# Patient Record
Sex: Male | Born: 1940 | Race: White | Hispanic: No | Smoking: Former smoker
Health system: Southern US, Community
[De-identification: ages and names within clinical notes are randomized; demographics above are authoritative.]

## PROBLEM LIST (undated history)

## (undated) DIAGNOSIS — G40909 Epilepsy, unspecified, not intractable, without status epilepticus: Secondary | ICD-10-CM

## (undated) DIAGNOSIS — F32A Depression, unspecified: Secondary | ICD-10-CM

## (undated) DIAGNOSIS — Z87442 Personal history of urinary calculi: Secondary | ICD-10-CM

## (undated) DIAGNOSIS — I219 Acute myocardial infarction, unspecified: Secondary | ICD-10-CM

## (undated) DIAGNOSIS — F329 Major depressive disorder, single episode, unspecified: Secondary | ICD-10-CM

## (undated) DIAGNOSIS — H409 Unspecified glaucoma: Secondary | ICD-10-CM

## (undated) DIAGNOSIS — C189 Malignant neoplasm of colon, unspecified: Secondary | ICD-10-CM

## (undated) DIAGNOSIS — H269 Unspecified cataract: Secondary | ICD-10-CM

## (undated) DIAGNOSIS — F419 Anxiety disorder, unspecified: Secondary | ICD-10-CM

## (undated) HISTORY — DX: Epilepsy, unspecified, not intractable, without status epilepticus: G40.909

## (undated) HISTORY — DX: Major depressive disorder, single episode, unspecified: F32.9

## (undated) HISTORY — DX: Malignant neoplasm of colon, unspecified: C18.9

## (undated) HISTORY — DX: Anxiety disorder, unspecified: F41.9

## (undated) HISTORY — DX: Unspecified cataract: H26.9

## (undated) HISTORY — PX: OTHER SURGICAL HISTORY: SHX169

## (undated) HISTORY — DX: Acute myocardial infarction, unspecified: I21.9

## (undated) HISTORY — PX: CORONARY ANGIOPLASTY: SHX604

## (undated) HISTORY — DX: Depression, unspecified: F32.A

## (undated) HISTORY — PX: APPENDECTOMY: SHX54

## (undated) HISTORY — DX: Unspecified glaucoma: H40.9

## (undated) HISTORY — PX: CHOLECYSTECTOMY: SHX55

---

## 1999-06-12 DIAGNOSIS — E782 Mixed hyperlipidemia: Secondary | ICD-10-CM | POA: Insufficient documentation

## 1999-06-12 DIAGNOSIS — Z9049 Acquired absence of other specified parts of digestive tract: Secondary | ICD-10-CM | POA: Insufficient documentation

## 1999-06-12 DIAGNOSIS — L120 Bullous pemphigoid: Secondary | ICD-10-CM | POA: Insufficient documentation

## 1999-06-12 DIAGNOSIS — I5032 Chronic diastolic (congestive) heart failure: Secondary | ICD-10-CM | POA: Insufficient documentation

## 1999-06-12 DIAGNOSIS — C189 Malignant neoplasm of colon, unspecified: Secondary | ICD-10-CM | POA: Insufficient documentation

## 1999-06-12 DIAGNOSIS — I110 Hypertensive heart disease with heart failure: Secondary | ICD-10-CM | POA: Insufficient documentation

## 1999-06-12 DIAGNOSIS — J301 Allergic rhinitis due to pollen: Secondary | ICD-10-CM | POA: Insufficient documentation

## 1999-06-12 DIAGNOSIS — E559 Vitamin D deficiency, unspecified: Secondary | ICD-10-CM | POA: Insufficient documentation

## 1999-06-12 DIAGNOSIS — E669 Obesity, unspecified: Secondary | ICD-10-CM | POA: Insufficient documentation

## 2001-06-04 ENCOUNTER — Emergency Department (HOSPITAL_COMMUNITY): Admission: EM | Admit: 2001-06-04 | Discharge: 2001-06-04 | Payer: Self-pay | Admitting: Emergency Medicine

## 2009-10-24 ENCOUNTER — Ambulatory Visit (HOSPITAL_COMMUNITY): Admission: RE | Admit: 2009-10-24 | Discharge: 2009-10-24 | Payer: Self-pay | Admitting: Ophthalmology

## 2010-10-27 NOTE — Op Note (Signed)
[facility]  Patient:    Alan Melendez, Alan Melendez Visit Number: [id number] MRN: [id number]          Service Type: EMS Location: ED Attending Physician:  Doug Sou Dictated by:   Nicki Reaper, M.D. Proc. Date: 06/04/01 Admit Date:  06/04/2001                             Operative Report  PREOPERATIVE DIAGNOSIS:  Amputation tip, right index finger.  POSTOPERATIVE DIAGNOSIS:  Amputation tip, right index finger.  PROCEDURE:  Revision amputation, right index finger.  SURGEON:  Nicki Reaper, M.D.  ASSISTANT:  Joaquin Courts, R.N.  ANESTHESIA:  Metacarpal block.  HISTORY:  The patient is a 70 year old right-hand dominant male who suffered a _____ injury to his right index finger.  He was seen at the request of [facility] and transferred for repair.  DESCRIPTION OF PROCEDURE:  The patient was given a metacarpal block with 1% Xylocaine without epinephrine, prepped and draped using Betadine solution.  A Penrose drain was used for tourniquet control at the base of the finger.  The dorsal nail was debrided, a rongeur used to smooth the bone, the skin debrided.  This allowed the volar skin to be anterior and dorsally transposed and advanced for closure.  Sutures were placed of interrupted 5-0 nylon in the skin, 5-0 chromic was placed through the tip into the nail plate to secure the skin distally.  A sterile compressive dressing and splint were applied.  The patient tolerated the procedure well and is discharged home, to return to the [facility] [place] in one week, on Vicodin and Keflex. Dictated by:   Nicki Reaper, M.D. Attending Physician:  Doug Sou DD:  06/04/01 TD:  06/05/01 Job: [id number]

## 2015-06-20 DIAGNOSIS — G40909 Epilepsy, unspecified, not intractable, without status epilepticus: Secondary | ICD-10-CM | POA: Diagnosis not present

## 2015-06-20 DIAGNOSIS — Z23 Encounter for immunization: Secondary | ICD-10-CM | POA: Diagnosis not present

## 2015-06-20 DIAGNOSIS — E039 Hypothyroidism, unspecified: Secondary | ICD-10-CM | POA: Diagnosis not present

## 2015-06-20 DIAGNOSIS — F17210 Nicotine dependence, cigarettes, uncomplicated: Secondary | ICD-10-CM | POA: Diagnosis not present

## 2015-06-20 DIAGNOSIS — H8110 Benign paroxysmal vertigo, unspecified ear: Secondary | ICD-10-CM | POA: Diagnosis not present

## 2015-06-20 DIAGNOSIS — R7301 Impaired fasting glucose: Secondary | ICD-10-CM | POA: Diagnosis not present

## 2015-06-20 DIAGNOSIS — L03312 Cellulitis of back [any part except buttock]: Secondary | ICD-10-CM | POA: Diagnosis not present

## 2015-06-20 DIAGNOSIS — E7800 Pure hypercholesterolemia, unspecified: Secondary | ICD-10-CM | POA: Diagnosis not present

## 2015-06-27 DIAGNOSIS — G40909 Epilepsy, unspecified, not intractable, without status epilepticus: Secondary | ICD-10-CM | POA: Diagnosis not present

## 2015-06-27 DIAGNOSIS — M7541 Impingement syndrome of right shoulder: Secondary | ICD-10-CM | POA: Diagnosis not present

## 2015-06-27 DIAGNOSIS — R0789 Other chest pain: Secondary | ICD-10-CM | POA: Diagnosis not present

## 2015-06-27 DIAGNOSIS — E782 Mixed hyperlipidemia: Secondary | ICD-10-CM | POA: Diagnosis not present

## 2015-06-27 DIAGNOSIS — Z0001 Encounter for general adult medical examination with abnormal findings: Secondary | ICD-10-CM | POA: Diagnosis not present

## 2015-06-27 DIAGNOSIS — F17210 Nicotine dependence, cigarettes, uncomplicated: Secondary | ICD-10-CM | POA: Diagnosis not present

## 2015-06-27 DIAGNOSIS — S43431A Superior glenoid labrum lesion of right shoulder, initial encounter: Secondary | ICD-10-CM | POA: Diagnosis not present

## 2015-07-01 DIAGNOSIS — R079 Chest pain, unspecified: Secondary | ICD-10-CM | POA: Diagnosis not present

## 2015-07-04 DIAGNOSIS — R079 Chest pain, unspecified: Secondary | ICD-10-CM | POA: Diagnosis not present

## 2015-10-20 DIAGNOSIS — G40909 Epilepsy, unspecified, not intractable, without status epilepticus: Secondary | ICD-10-CM | POA: Diagnosis not present

## 2015-10-20 DIAGNOSIS — M545 Low back pain: Secondary | ICD-10-CM | POA: Diagnosis not present

## 2015-10-20 DIAGNOSIS — F17210 Nicotine dependence, cigarettes, uncomplicated: Secondary | ICD-10-CM | POA: Diagnosis not present

## 2016-01-04 DIAGNOSIS — D239 Other benign neoplasm of skin, unspecified: Secondary | ICD-10-CM | POA: Diagnosis not present

## 2016-01-04 DIAGNOSIS — L821 Other seborrheic keratosis: Secondary | ICD-10-CM | POA: Diagnosis not present

## 2016-01-04 DIAGNOSIS — L720 Epidermal cyst: Secondary | ICD-10-CM | POA: Diagnosis not present

## 2016-01-30 DIAGNOSIS — Z6830 Body mass index (BMI) 30.0-30.9, adult: Secondary | ICD-10-CM | POA: Diagnosis not present

## 2016-01-30 DIAGNOSIS — H6122 Impacted cerumen, left ear: Secondary | ICD-10-CM | POA: Diagnosis not present

## 2016-01-30 DIAGNOSIS — G40909 Epilepsy, unspecified, not intractable, without status epilepticus: Secondary | ICD-10-CM | POA: Diagnosis not present

## 2016-02-28 DIAGNOSIS — G40909 Epilepsy, unspecified, not intractable, without status epilepticus: Secondary | ICD-10-CM | POA: Diagnosis not present

## 2016-02-28 DIAGNOSIS — R7301 Impaired fasting glucose: Secondary | ICD-10-CM | POA: Diagnosis not present

## 2016-02-28 DIAGNOSIS — I10 Essential (primary) hypertension: Secondary | ICD-10-CM | POA: Diagnosis not present

## 2016-02-28 DIAGNOSIS — E782 Mixed hyperlipidemia: Secondary | ICD-10-CM | POA: Diagnosis not present

## 2016-03-01 DIAGNOSIS — G40909 Epilepsy, unspecified, not intractable, without status epilepticus: Secondary | ICD-10-CM | POA: Diagnosis not present

## 2016-03-01 DIAGNOSIS — F17210 Nicotine dependence, cigarettes, uncomplicated: Secondary | ICD-10-CM | POA: Diagnosis not present

## 2016-03-01 DIAGNOSIS — Z6830 Body mass index (BMI) 30.0-30.9, adult: Secondary | ICD-10-CM | POA: Diagnosis not present

## 2016-03-01 DIAGNOSIS — Z23 Encounter for immunization: Secondary | ICD-10-CM | POA: Diagnosis not present

## 2016-03-22 DIAGNOSIS — H401132 Primary open-angle glaucoma, bilateral, moderate stage: Secondary | ICD-10-CM | POA: Diagnosis not present

## 2016-04-05 DIAGNOSIS — H401132 Primary open-angle glaucoma, bilateral, moderate stage: Secondary | ICD-10-CM | POA: Diagnosis not present

## 2016-07-03 DIAGNOSIS — G40909 Epilepsy, unspecified, not intractable, without status epilepticus: Secondary | ICD-10-CM | POA: Diagnosis not present

## 2016-07-03 DIAGNOSIS — J301 Allergic rhinitis due to pollen: Secondary | ICD-10-CM | POA: Diagnosis not present

## 2016-07-03 DIAGNOSIS — Z6830 Body mass index (BMI) 30.0-30.9, adult: Secondary | ICD-10-CM | POA: Diagnosis not present

## 2016-07-03 DIAGNOSIS — F17210 Nicotine dependence, cigarettes, uncomplicated: Secondary | ICD-10-CM | POA: Diagnosis not present

## 2016-08-31 DIAGNOSIS — M79674 Pain in right toe(s): Secondary | ICD-10-CM | POA: Diagnosis not present

## 2016-10-26 DIAGNOSIS — R7301 Impaired fasting glucose: Secondary | ICD-10-CM | POA: Diagnosis not present

## 2016-10-26 DIAGNOSIS — G40909 Epilepsy, unspecified, not intractable, without status epilepticus: Secondary | ICD-10-CM | POA: Diagnosis not present

## 2016-10-26 DIAGNOSIS — F17210 Nicotine dependence, cigarettes, uncomplicated: Secondary | ICD-10-CM | POA: Diagnosis not present

## 2016-10-26 DIAGNOSIS — I10 Essential (primary) hypertension: Secondary | ICD-10-CM | POA: Diagnosis not present

## 2016-10-26 DIAGNOSIS — E782 Mixed hyperlipidemia: Secondary | ICD-10-CM | POA: Diagnosis not present

## 2016-10-31 DIAGNOSIS — G40909 Epilepsy, unspecified, not intractable, without status epilepticus: Secondary | ICD-10-CM | POA: Diagnosis not present

## 2016-10-31 DIAGNOSIS — J301 Allergic rhinitis due to pollen: Secondary | ICD-10-CM | POA: Diagnosis not present

## 2016-10-31 DIAGNOSIS — F17210 Nicotine dependence, cigarettes, uncomplicated: Secondary | ICD-10-CM | POA: Diagnosis not present

## 2016-10-31 DIAGNOSIS — Z6830 Body mass index (BMI) 30.0-30.9, adult: Secondary | ICD-10-CM | POA: Diagnosis not present

## 2017-04-01 DIAGNOSIS — J209 Acute bronchitis, unspecified: Secondary | ICD-10-CM | POA: Diagnosis not present

## 2017-04-01 DIAGNOSIS — Z23 Encounter for immunization: Secondary | ICD-10-CM | POA: Diagnosis not present

## 2017-04-01 DIAGNOSIS — Z6830 Body mass index (BMI) 30.0-30.9, adult: Secondary | ICD-10-CM | POA: Diagnosis not present

## 2017-07-12 DIAGNOSIS — Z6831 Body mass index (BMI) 31.0-31.9, adult: Secondary | ICD-10-CM | POA: Diagnosis not present

## 2017-07-12 DIAGNOSIS — R0789 Other chest pain: Secondary | ICD-10-CM | POA: Diagnosis not present

## 2017-07-12 DIAGNOSIS — S56912A Strain of unspecified muscles, fascia and tendons at forearm level, left arm, initial encounter: Secondary | ICD-10-CM | POA: Diagnosis not present

## 2017-07-12 DIAGNOSIS — E782 Mixed hyperlipidemia: Secondary | ICD-10-CM | POA: Diagnosis not present

## 2017-07-12 DIAGNOSIS — F17210 Nicotine dependence, cigarettes, uncomplicated: Secondary | ICD-10-CM | POA: Diagnosis not present

## 2017-07-12 DIAGNOSIS — I10 Essential (primary) hypertension: Secondary | ICD-10-CM | POA: Diagnosis not present

## 2017-09-13 DIAGNOSIS — Z0001 Encounter for general adult medical examination with abnormal findings: Secondary | ICD-10-CM | POA: Diagnosis not present

## 2017-09-13 DIAGNOSIS — Z6830 Body mass index (BMI) 30.0-30.9, adult: Secondary | ICD-10-CM | POA: Diagnosis not present

## 2017-09-13 DIAGNOSIS — G40909 Epilepsy, unspecified, not intractable, without status epilepticus: Secondary | ICD-10-CM | POA: Diagnosis not present

## 2017-09-13 DIAGNOSIS — F17210 Nicotine dependence, cigarettes, uncomplicated: Secondary | ICD-10-CM | POA: Diagnosis not present

## 2017-09-13 DIAGNOSIS — Z23 Encounter for immunization: Secondary | ICD-10-CM | POA: Diagnosis not present

## 2017-09-13 DIAGNOSIS — J301 Allergic rhinitis due to pollen: Secondary | ICD-10-CM | POA: Diagnosis not present

## 2017-09-13 DIAGNOSIS — Z1331 Encounter for screening for depression: Secondary | ICD-10-CM | POA: Diagnosis not present

## 2017-09-13 DIAGNOSIS — Z1389 Encounter for screening for other disorder: Secondary | ICD-10-CM | POA: Diagnosis not present

## 2017-09-13 DIAGNOSIS — E782 Mixed hyperlipidemia: Secondary | ICD-10-CM | POA: Diagnosis not present

## 2017-09-13 DIAGNOSIS — Z9189 Other specified personal risk factors, not elsewhere classified: Secondary | ICD-10-CM | POA: Diagnosis not present

## 2017-09-13 DIAGNOSIS — R5383 Other fatigue: Secondary | ICD-10-CM | POA: Diagnosis not present

## 2017-10-07 DIAGNOSIS — Z6830 Body mass index (BMI) 30.0-30.9, adult: Secondary | ICD-10-CM | POA: Diagnosis not present

## 2017-10-07 DIAGNOSIS — F17210 Nicotine dependence, cigarettes, uncomplicated: Secondary | ICD-10-CM | POA: Diagnosis not present

## 2017-10-07 DIAGNOSIS — J069 Acute upper respiratory infection, unspecified: Secondary | ICD-10-CM | POA: Diagnosis not present

## 2017-10-07 DIAGNOSIS — J019 Acute sinusitis, unspecified: Secondary | ICD-10-CM | POA: Diagnosis not present

## 2017-12-06 DIAGNOSIS — Z6829 Body mass index (BMI) 29.0-29.9, adult: Secondary | ICD-10-CM | POA: Diagnosis not present

## 2017-12-06 DIAGNOSIS — R202 Paresthesia of skin: Secondary | ICD-10-CM | POA: Diagnosis not present

## 2017-12-17 DIAGNOSIS — Z6830 Body mass index (BMI) 30.0-30.9, adult: Secondary | ICD-10-CM | POA: Diagnosis not present

## 2017-12-17 DIAGNOSIS — M9903 Segmental and somatic dysfunction of lumbar region: Secondary | ICD-10-CM | POA: Diagnosis not present

## 2017-12-17 DIAGNOSIS — M546 Pain in thoracic spine: Secondary | ICD-10-CM | POA: Diagnosis not present

## 2017-12-17 DIAGNOSIS — M9904 Segmental and somatic dysfunction of sacral region: Secondary | ICD-10-CM | POA: Diagnosis not present

## 2017-12-17 DIAGNOSIS — M9902 Segmental and somatic dysfunction of thoracic region: Secondary | ICD-10-CM | POA: Diagnosis not present

## 2017-12-17 DIAGNOSIS — R202 Paresthesia of skin: Secondary | ICD-10-CM | POA: Diagnosis not present

## 2017-12-18 DIAGNOSIS — M9903 Segmental and somatic dysfunction of lumbar region: Secondary | ICD-10-CM | POA: Diagnosis not present

## 2017-12-18 DIAGNOSIS — M9904 Segmental and somatic dysfunction of sacral region: Secondary | ICD-10-CM | POA: Diagnosis not present

## 2017-12-18 DIAGNOSIS — M9902 Segmental and somatic dysfunction of thoracic region: Secondary | ICD-10-CM | POA: Diagnosis not present

## 2017-12-18 DIAGNOSIS — M546 Pain in thoracic spine: Secondary | ICD-10-CM | POA: Diagnosis not present

## 2017-12-23 DIAGNOSIS — M9904 Segmental and somatic dysfunction of sacral region: Secondary | ICD-10-CM | POA: Diagnosis not present

## 2017-12-23 DIAGNOSIS — M9902 Segmental and somatic dysfunction of thoracic region: Secondary | ICD-10-CM | POA: Diagnosis not present

## 2017-12-23 DIAGNOSIS — M9903 Segmental and somatic dysfunction of lumbar region: Secondary | ICD-10-CM | POA: Diagnosis not present

## 2017-12-23 DIAGNOSIS — M546 Pain in thoracic spine: Secondary | ICD-10-CM | POA: Diagnosis not present

## 2017-12-26 DIAGNOSIS — M9902 Segmental and somatic dysfunction of thoracic region: Secondary | ICD-10-CM | POA: Diagnosis not present

## 2017-12-26 DIAGNOSIS — M9903 Segmental and somatic dysfunction of lumbar region: Secondary | ICD-10-CM | POA: Diagnosis not present

## 2017-12-26 DIAGNOSIS — M546 Pain in thoracic spine: Secondary | ICD-10-CM | POA: Diagnosis not present

## 2017-12-26 DIAGNOSIS — M9904 Segmental and somatic dysfunction of sacral region: Secondary | ICD-10-CM | POA: Diagnosis not present

## 2017-12-30 DIAGNOSIS — R202 Paresthesia of skin: Secondary | ICD-10-CM | POA: Diagnosis not present

## 2017-12-30 DIAGNOSIS — Z6830 Body mass index (BMI) 30.0-30.9, adult: Secondary | ICD-10-CM | POA: Diagnosis not present

## 2017-12-30 DIAGNOSIS — M546 Pain in thoracic spine: Secondary | ICD-10-CM | POA: Diagnosis not present

## 2018-01-01 DIAGNOSIS — M4854XA Collapsed vertebra, not elsewhere classified, thoracic region, initial encounter for fracture: Secondary | ICD-10-CM | POA: Diagnosis not present

## 2018-01-01 DIAGNOSIS — M8448XA Pathological fracture, other site, initial encounter for fracture: Secondary | ICD-10-CM | POA: Diagnosis not present

## 2018-01-01 DIAGNOSIS — M47814 Spondylosis without myelopathy or radiculopathy, thoracic region: Secondary | ICD-10-CM | POA: Diagnosis not present

## 2018-01-23 ENCOUNTER — Ambulatory Visit (INDEPENDENT_AMBULATORY_CARE_PROVIDER_SITE_OTHER): Payer: Medicare Other | Admitting: Orthopaedic Surgery

## 2018-01-23 ENCOUNTER — Encounter (INDEPENDENT_AMBULATORY_CARE_PROVIDER_SITE_OTHER): Payer: Self-pay | Admitting: Orthopaedic Surgery

## 2018-01-23 VITALS — BP 118/74 | HR 76 | Ht 69.5 in | Wt 206.0 lb

## 2018-01-23 DIAGNOSIS — M545 Low back pain, unspecified: Secondary | ICD-10-CM

## 2018-01-23 DIAGNOSIS — M546 Pain in thoracic spine: Secondary | ICD-10-CM | POA: Diagnosis not present

## 2018-01-23 NOTE — Progress Notes (Signed)
Office Visit Note   Patient: Alan Melendez           Date of Birth: December 13, 1940           MRN: [id number] Visit Date: 01/23/2018              Requested by: No referring provider defined for this encounter. PCP: Curlene Labrum, MD   Assessment & Plan: Visit Diagnoses:  1. Pain in thoracic spine   2. Low back pain without sciatica, unspecified back pain laterality, unspecified chronicity     Plan: Patient states his pain is significantly improved recently.  He is more comfortable is walking better.  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  He likely may have a bulging lumbar disc that was giving him some symptoms but this is improved.  He can return if he gets increased symptoms.  Follow-Up Instructions: Return if symptoms worsen or fail to improve.   Orders:  No orders of the defined types were placed in this encounter.  No orders of the defined types were placed in this encounter.     Procedures: No procedures performed   Clinical Data: No additional findings.   Subjective: Chief Complaint  Patient presents with  . Middle Back - Pain    HPI 77 year old male referred by Dr. Florene Route for problems with pain around T12 which he states recently is gotten better.  He said some problems with numbness in his legs.  Previous T-spine MRI demonstrated some compression at T6 not acute.  Minimal disc protrusion at T8-9 without cord compression.  He states he fell several years ago also had some increased symptoms trying to help his wife who is in poor health pulling her up in bed.  Review of Systems 14 point review of systems positive for anxiety bronchitis cataracts, depression, epilepsy on phenobarbital and Dilantin.  Previous gallbladder surgery appendectomy.  Patient smokes 2 packs/day x 50 years he is retired.  Otherwise negative as pertains HPI.  No associated bowel bladder symptoms no fever or  chills.   Objective: Vital Signs: BP 118/74   Pulse 76   Ht 5' 9.5" (1.765 m)   Wt 206 lb (93.4 kg)   BMI 29.98 kg/m   Physical Exam  Constitutional: He is oriented to person, place, and time. He appears well-developed and well-nourished.  HENT:  Head: Normocephalic and atraumatic.  Eyes: Pupils are equal, round, and reactive to light. EOM are normal.  Neck: No tracheal deviation present. No thyromegaly present.  Cardiovascular: Normal rate.  Pulmonary/Chest: Effort normal. He has no wheezes.  Abdominal: Soft. Bowel sounds are normal.  Neurological: He is alert and oriented to person, place, and time.  Skin: Skin is warm and dry. Capillary refill takes less than 2 seconds.  Psychiatric: He has a normal mood and affect. His behavior is normal. Judgment and thought content normal.    Ortho Exam patient has some tenderness thoracic paraspinals which is mild.  No rash no tenderness following the ribs.  Negative logroll to the hips.  Minimal sciatic notch tenderness.  Normal heel toe gait.  Has palpable pedal pulses.  Quadriceps hip flexors anterior tib gastrocsoleus are strong.  Specialty Comments:  No specialty comments available.  Imaging: No results found.   PMFS History: There are no active problems to display for this patient.  Past Medical History:  Diagnosis Date  . Anxiety   . Cataracts, bilateral   .  Depression   . Epilepsy ([place])   . Glaucoma     Family History  Problem Relation Age of Onset  . Diabetes Father   . Diabetes Sister     Past Surgical History:  Procedure Laterality Date  . CHOLECYSTECTOMY     Social History   Occupational History  . Not on file  Tobacco Use  . Smoking status: Current Every Day Smoker    Packs/day: 2.00    Years: 50.00    Pack years: 100.00  . Smokeless tobacco: Never Used  Substance and Sexual Activity  . Alcohol use: Not Currently  . Drug use: Not on file  . Sexual activity: Not on file

## 2018-01-26 ENCOUNTER — Encounter (INDEPENDENT_AMBULATORY_CARE_PROVIDER_SITE_OTHER): Payer: Self-pay | Admitting: Orthopaedic Surgery

## 2018-01-29 DIAGNOSIS — J0100 Acute maxillary sinusitis, unspecified: Secondary | ICD-10-CM | POA: Diagnosis not present

## 2018-01-29 DIAGNOSIS — J301 Allergic rhinitis due to pollen: Secondary | ICD-10-CM | POA: Diagnosis not present

## 2018-01-29 DIAGNOSIS — G40909 Epilepsy, unspecified, not intractable, without status epilepticus: Secondary | ICD-10-CM | POA: Diagnosis not present

## 2018-01-29 DIAGNOSIS — Z6828 Body mass index (BMI) 28.0-28.9, adult: Secondary | ICD-10-CM | POA: Diagnosis not present

## 2018-01-29 DIAGNOSIS — F17210 Nicotine dependence, cigarettes, uncomplicated: Secondary | ICD-10-CM | POA: Diagnosis not present

## 2018-01-29 DIAGNOSIS — E782 Mixed hyperlipidemia: Secondary | ICD-10-CM | POA: Diagnosis not present

## 2018-01-29 DIAGNOSIS — M545 Low back pain: Secondary | ICD-10-CM | POA: Diagnosis not present

## 2018-07-05 DIAGNOSIS — F17200 Nicotine dependence, unspecified, uncomplicated: Secondary | ICD-10-CM | POA: Diagnosis not present

## 2018-07-05 DIAGNOSIS — M545 Low back pain: Secondary | ICD-10-CM | POA: Diagnosis not present

## 2018-07-05 DIAGNOSIS — X500XXA Overexertion from strenuous movement or load, initial encounter: Secondary | ICD-10-CM | POA: Diagnosis not present

## 2018-07-05 DIAGNOSIS — S39012A Strain of muscle, fascia and tendon of lower back, initial encounter: Secondary | ICD-10-CM | POA: Diagnosis not present

## 2018-07-05 DIAGNOSIS — Z79899 Other long term (current) drug therapy: Secondary | ICD-10-CM | POA: Diagnosis not present

## 2018-07-08 DIAGNOSIS — Z6828 Body mass index (BMI) 28.0-28.9, adult: Secondary | ICD-10-CM | POA: Diagnosis not present

## 2018-07-08 DIAGNOSIS — M5430 Sciatica, unspecified side: Secondary | ICD-10-CM | POA: Diagnosis not present

## 2018-07-16 DIAGNOSIS — J440 Chronic obstructive pulmonary disease with acute lower respiratory infection: Secondary | ICD-10-CM | POA: Diagnosis not present

## 2018-07-16 DIAGNOSIS — M5416 Radiculopathy, lumbar region: Secondary | ICD-10-CM | POA: Diagnosis not present

## 2018-07-16 DIAGNOSIS — M545 Low back pain: Secondary | ICD-10-CM | POA: Diagnosis not present

## 2018-07-16 DIAGNOSIS — R509 Fever, unspecified: Secondary | ICD-10-CM | POA: Diagnosis not present

## 2018-07-16 DIAGNOSIS — Z6828 Body mass index (BMI) 28.0-28.9, adult: Secondary | ICD-10-CM | POA: Diagnosis not present

## 2018-07-18 DIAGNOSIS — M5416 Radiculopathy, lumbar region: Secondary | ICD-10-CM | POA: Diagnosis not present

## 2018-07-18 DIAGNOSIS — M4856XA Collapsed vertebra, not elsewhere classified, lumbar region, initial encounter for fracture: Secondary | ICD-10-CM | POA: Diagnosis not present

## 2018-07-18 DIAGNOSIS — R29890 Loss of height: Secondary | ICD-10-CM | POA: Diagnosis not present

## 2018-07-18 DIAGNOSIS — M5126 Other intervertebral disc displacement, lumbar region: Secondary | ICD-10-CM | POA: Diagnosis not present

## 2018-07-18 DIAGNOSIS — M48061 Spinal stenosis, lumbar region without neurogenic claudication: Secondary | ICD-10-CM | POA: Diagnosis not present

## 2018-07-18 DIAGNOSIS — M5136 Other intervertebral disc degeneration, lumbar region: Secondary | ICD-10-CM | POA: Diagnosis not present

## 2018-07-18 DIAGNOSIS — M545 Low back pain: Secondary | ICD-10-CM | POA: Diagnosis not present

## 2018-07-23 DIAGNOSIS — S32019D Unspecified fracture of first lumbar vertebra, subsequent encounter for fracture with routine healing: Secondary | ICD-10-CM | POA: Diagnosis not present

## 2018-07-23 DIAGNOSIS — M549 Dorsalgia, unspecified: Secondary | ICD-10-CM | POA: Diagnosis not present

## 2018-08-13 DIAGNOSIS — S32019D Unspecified fracture of first lumbar vertebra, subsequent encounter for fracture with routine healing: Secondary | ICD-10-CM | POA: Diagnosis not present

## 2018-09-15 DIAGNOSIS — E782 Mixed hyperlipidemia: Secondary | ICD-10-CM | POA: Diagnosis not present

## 2018-09-15 DIAGNOSIS — R7301 Impaired fasting glucose: Secondary | ICD-10-CM | POA: Diagnosis not present

## 2018-09-15 DIAGNOSIS — J440 Chronic obstructive pulmonary disease with acute lower respiratory infection: Secondary | ICD-10-CM | POA: Diagnosis not present

## 2018-09-15 DIAGNOSIS — I10 Essential (primary) hypertension: Secondary | ICD-10-CM | POA: Diagnosis not present

## 2018-09-15 DIAGNOSIS — F17210 Nicotine dependence, cigarettes, uncomplicated: Secondary | ICD-10-CM | POA: Diagnosis not present

## 2018-09-18 DIAGNOSIS — G40909 Epilepsy, unspecified, not intractable, without status epilepticus: Secondary | ICD-10-CM | POA: Diagnosis not present

## 2018-09-18 DIAGNOSIS — M810 Age-related osteoporosis without current pathological fracture: Secondary | ICD-10-CM | POA: Diagnosis not present

## 2018-09-18 DIAGNOSIS — J301 Allergic rhinitis due to pollen: Secondary | ICD-10-CM | POA: Diagnosis not present

## 2018-09-18 DIAGNOSIS — J0100 Acute maxillary sinusitis, unspecified: Secondary | ICD-10-CM | POA: Diagnosis not present

## 2018-09-18 DIAGNOSIS — F17210 Nicotine dependence, cigarettes, uncomplicated: Secondary | ICD-10-CM | POA: Diagnosis not present

## 2018-09-18 DIAGNOSIS — Z6828 Body mass index (BMI) 28.0-28.9, adult: Secondary | ICD-10-CM | POA: Diagnosis not present

## 2018-09-18 DIAGNOSIS — E782 Mixed hyperlipidemia: Secondary | ICD-10-CM | POA: Diagnosis not present

## 2018-09-18 DIAGNOSIS — M545 Low back pain: Secondary | ICD-10-CM | POA: Diagnosis not present

## 2018-09-23 DIAGNOSIS — M5136 Other intervertebral disc degeneration, lumbar region: Secondary | ICD-10-CM | POA: Diagnosis not present

## 2018-09-23 DIAGNOSIS — M9904 Segmental and somatic dysfunction of sacral region: Secondary | ICD-10-CM | POA: Diagnosis not present

## 2018-09-23 DIAGNOSIS — M9905 Segmental and somatic dysfunction of pelvic region: Secondary | ICD-10-CM | POA: Diagnosis not present

## 2018-09-23 DIAGNOSIS — M9903 Segmental and somatic dysfunction of lumbar region: Secondary | ICD-10-CM | POA: Diagnosis not present

## 2018-09-24 DIAGNOSIS — M9903 Segmental and somatic dysfunction of lumbar region: Secondary | ICD-10-CM | POA: Diagnosis not present

## 2018-09-24 DIAGNOSIS — M9905 Segmental and somatic dysfunction of pelvic region: Secondary | ICD-10-CM | POA: Diagnosis not present

## 2018-09-24 DIAGNOSIS — M5136 Other intervertebral disc degeneration, lumbar region: Secondary | ICD-10-CM | POA: Diagnosis not present

## 2018-09-24 DIAGNOSIS — M9904 Segmental and somatic dysfunction of sacral region: Secondary | ICD-10-CM | POA: Diagnosis not present

## 2018-09-25 DIAGNOSIS — M9904 Segmental and somatic dysfunction of sacral region: Secondary | ICD-10-CM | POA: Diagnosis not present

## 2018-09-25 DIAGNOSIS — M9905 Segmental and somatic dysfunction of pelvic region: Secondary | ICD-10-CM | POA: Diagnosis not present

## 2018-09-25 DIAGNOSIS — M9903 Segmental and somatic dysfunction of lumbar region: Secondary | ICD-10-CM | POA: Diagnosis not present

## 2018-09-25 DIAGNOSIS — M5136 Other intervertebral disc degeneration, lumbar region: Secondary | ICD-10-CM | POA: Diagnosis not present

## 2018-09-29 DIAGNOSIS — M9904 Segmental and somatic dysfunction of sacral region: Secondary | ICD-10-CM | POA: Diagnosis not present

## 2018-09-29 DIAGNOSIS — M9903 Segmental and somatic dysfunction of lumbar region: Secondary | ICD-10-CM | POA: Diagnosis not present

## 2018-09-29 DIAGNOSIS — M9905 Segmental and somatic dysfunction of pelvic region: Secondary | ICD-10-CM | POA: Diagnosis not present

## 2018-09-29 DIAGNOSIS — M5136 Other intervertebral disc degeneration, lumbar region: Secondary | ICD-10-CM | POA: Diagnosis not present

## 2018-09-30 DIAGNOSIS — M9904 Segmental and somatic dysfunction of sacral region: Secondary | ICD-10-CM | POA: Diagnosis not present

## 2018-09-30 DIAGNOSIS — M9903 Segmental and somatic dysfunction of lumbar region: Secondary | ICD-10-CM | POA: Diagnosis not present

## 2018-09-30 DIAGNOSIS — M5136 Other intervertebral disc degeneration, lumbar region: Secondary | ICD-10-CM | POA: Diagnosis not present

## 2018-09-30 DIAGNOSIS — M9905 Segmental and somatic dysfunction of pelvic region: Secondary | ICD-10-CM | POA: Diagnosis not present

## 2018-10-01 DIAGNOSIS — M9905 Segmental and somatic dysfunction of pelvic region: Secondary | ICD-10-CM | POA: Diagnosis not present

## 2018-10-01 DIAGNOSIS — M9903 Segmental and somatic dysfunction of lumbar region: Secondary | ICD-10-CM | POA: Diagnosis not present

## 2018-10-01 DIAGNOSIS — M9904 Segmental and somatic dysfunction of sacral region: Secondary | ICD-10-CM | POA: Diagnosis not present

## 2018-10-01 DIAGNOSIS — M5136 Other intervertebral disc degeneration, lumbar region: Secondary | ICD-10-CM | POA: Diagnosis not present

## 2018-10-07 DIAGNOSIS — M9904 Segmental and somatic dysfunction of sacral region: Secondary | ICD-10-CM | POA: Diagnosis not present

## 2018-10-07 DIAGNOSIS — M9905 Segmental and somatic dysfunction of pelvic region: Secondary | ICD-10-CM | POA: Diagnosis not present

## 2018-10-07 DIAGNOSIS — M9903 Segmental and somatic dysfunction of lumbar region: Secondary | ICD-10-CM | POA: Diagnosis not present

## 2018-10-07 DIAGNOSIS — M5136 Other intervertebral disc degeneration, lumbar region: Secondary | ICD-10-CM | POA: Diagnosis not present

## 2018-10-09 DIAGNOSIS — M9904 Segmental and somatic dysfunction of sacral region: Secondary | ICD-10-CM | POA: Diagnosis not present

## 2018-10-09 DIAGNOSIS — M9905 Segmental and somatic dysfunction of pelvic region: Secondary | ICD-10-CM | POA: Diagnosis not present

## 2018-10-09 DIAGNOSIS — M5136 Other intervertebral disc degeneration, lumbar region: Secondary | ICD-10-CM | POA: Diagnosis not present

## 2018-10-09 DIAGNOSIS — M9903 Segmental and somatic dysfunction of lumbar region: Secondary | ICD-10-CM | POA: Diagnosis not present

## 2018-10-14 DIAGNOSIS — M9904 Segmental and somatic dysfunction of sacral region: Secondary | ICD-10-CM | POA: Diagnosis not present

## 2018-10-14 DIAGNOSIS — M9903 Segmental and somatic dysfunction of lumbar region: Secondary | ICD-10-CM | POA: Diagnosis not present

## 2018-10-14 DIAGNOSIS — M5136 Other intervertebral disc degeneration, lumbar region: Secondary | ICD-10-CM | POA: Diagnosis not present

## 2018-10-14 DIAGNOSIS — M9905 Segmental and somatic dysfunction of pelvic region: Secondary | ICD-10-CM | POA: Diagnosis not present

## 2018-10-16 DIAGNOSIS — M5136 Other intervertebral disc degeneration, lumbar region: Secondary | ICD-10-CM | POA: Diagnosis not present

## 2018-10-16 DIAGNOSIS — M9905 Segmental and somatic dysfunction of pelvic region: Secondary | ICD-10-CM | POA: Diagnosis not present

## 2018-10-16 DIAGNOSIS — M9903 Segmental and somatic dysfunction of lumbar region: Secondary | ICD-10-CM | POA: Diagnosis not present

## 2018-10-16 DIAGNOSIS — M9904 Segmental and somatic dysfunction of sacral region: Secondary | ICD-10-CM | POA: Diagnosis not present

## 2019-01-08 DIAGNOSIS — H401110 Primary open-angle glaucoma, right eye, stage unspecified: Secondary | ICD-10-CM | POA: Diagnosis not present

## 2019-01-28 DIAGNOSIS — K429 Umbilical hernia without obstruction or gangrene: Secondary | ICD-10-CM | POA: Diagnosis not present

## 2019-01-28 DIAGNOSIS — K5900 Constipation, unspecified: Secondary | ICD-10-CM | POA: Diagnosis not present

## 2019-01-28 DIAGNOSIS — Z6827 Body mass index (BMI) 27.0-27.9, adult: Secondary | ICD-10-CM | POA: Diagnosis not present

## 2019-02-20 DIAGNOSIS — M25531 Pain in right wrist: Secondary | ICD-10-CM | POA: Diagnosis not present

## 2019-02-20 DIAGNOSIS — Z6827 Body mass index (BMI) 27.0-27.9, adult: Secondary | ICD-10-CM | POA: Diagnosis not present

## 2019-03-17 DIAGNOSIS — Z6825 Body mass index (BMI) 25.0-25.9, adult: Secondary | ICD-10-CM | POA: Diagnosis not present

## 2019-03-17 DIAGNOSIS — Z23 Encounter for immunization: Secondary | ICD-10-CM | POA: Diagnosis not present

## 2019-03-17 DIAGNOSIS — F17210 Nicotine dependence, cigarettes, uncomplicated: Secondary | ICD-10-CM | POA: Diagnosis not present

## 2019-03-17 DIAGNOSIS — J301 Allergic rhinitis due to pollen: Secondary | ICD-10-CM | POA: Diagnosis not present

## 2019-03-17 DIAGNOSIS — Z6828 Body mass index (BMI) 28.0-28.9, adult: Secondary | ICD-10-CM | POA: Diagnosis not present

## 2019-03-17 DIAGNOSIS — Z9189 Other specified personal risk factors, not elsewhere classified: Secondary | ICD-10-CM | POA: Diagnosis not present

## 2019-03-17 DIAGNOSIS — G40909 Epilepsy, unspecified, not intractable, without status epilepticus: Secondary | ICD-10-CM | POA: Diagnosis not present

## 2019-03-17 DIAGNOSIS — M545 Low back pain: Secondary | ICD-10-CM | POA: Diagnosis not present

## 2019-03-17 DIAGNOSIS — M810 Age-related osteoporosis without current pathological fracture: Secondary | ICD-10-CM | POA: Diagnosis not present

## 2019-03-17 DIAGNOSIS — E782 Mixed hyperlipidemia: Secondary | ICD-10-CM | POA: Diagnosis not present

## 2019-03-17 DIAGNOSIS — E559 Vitamin D deficiency, unspecified: Secondary | ICD-10-CM | POA: Diagnosis not present

## 2019-05-03 DIAGNOSIS — Z720 Tobacco use: Secondary | ICD-10-CM | POA: Diagnosis not present

## 2019-05-03 DIAGNOSIS — M810 Age-related osteoporosis without current pathological fracture: Secondary | ICD-10-CM | POA: Diagnosis present

## 2019-05-03 DIAGNOSIS — F17210 Nicotine dependence, cigarettes, uncomplicated: Secondary | ICD-10-CM | POA: Diagnosis present

## 2019-05-03 DIAGNOSIS — Z20828 Contact with and (suspected) exposure to other viral communicable diseases: Secondary | ICD-10-CM | POA: Diagnosis present

## 2019-05-03 DIAGNOSIS — G40409 Other generalized epilepsy and epileptic syndromes, not intractable, without status epilepticus: Secondary | ICD-10-CM | POA: Diagnosis present

## 2019-05-03 DIAGNOSIS — I25119 Atherosclerotic heart disease of native coronary artery with unspecified angina pectoris: Secondary | ICD-10-CM | POA: Diagnosis not present

## 2019-05-03 DIAGNOSIS — R079 Chest pain, unspecified: Secondary | ICD-10-CM | POA: Diagnosis not present

## 2019-05-03 DIAGNOSIS — I249 Acute ischemic heart disease, unspecified: Secondary | ICD-10-CM | POA: Diagnosis not present

## 2019-05-03 DIAGNOSIS — I214 Non-ST elevation (NSTEMI) myocardial infarction: Secondary | ICD-10-CM | POA: Diagnosis not present

## 2019-05-04 DIAGNOSIS — R079 Chest pain, unspecified: Secondary | ICD-10-CM | POA: Diagnosis not present

## 2019-05-04 DIAGNOSIS — I25119 Atherosclerotic heart disease of native coronary artery with unspecified angina pectoris: Secondary | ICD-10-CM | POA: Diagnosis not present

## 2019-05-05 DIAGNOSIS — Z7983 Long term (current) use of bisphosphonates: Secondary | ICD-10-CM | POA: Diagnosis not present

## 2019-05-05 DIAGNOSIS — I509 Heart failure, unspecified: Secondary | ICD-10-CM | POA: Diagnosis not present

## 2019-05-05 DIAGNOSIS — R531 Weakness: Secondary | ICD-10-CM | POA: Diagnosis not present

## 2019-05-05 DIAGNOSIS — R569 Unspecified convulsions: Secondary | ICD-10-CM | POA: Diagnosis not present

## 2019-05-05 DIAGNOSIS — I252 Old myocardial infarction: Secondary | ICD-10-CM | POA: Diagnosis not present

## 2019-05-05 DIAGNOSIS — G40909 Epilepsy, unspecified, not intractable, without status epilepticus: Secondary | ICD-10-CM | POA: Diagnosis not present

## 2019-05-05 DIAGNOSIS — H409 Unspecified glaucoma: Secondary | ICD-10-CM | POA: Diagnosis present

## 2019-05-05 DIAGNOSIS — I214 Non-ST elevation (NSTEMI) myocardial infarction: Secondary | ICD-10-CM | POA: Diagnosis not present

## 2019-05-05 DIAGNOSIS — I2510 Atherosclerotic heart disease of native coronary artery without angina pectoris: Secondary | ICD-10-CM | POA: Diagnosis not present

## 2019-05-05 DIAGNOSIS — Z451 Encounter for adjustment and management of infusion pump: Secondary | ICD-10-CM | POA: Diagnosis not present

## 2019-05-05 DIAGNOSIS — Z66 Do not resuscitate: Secondary | ICD-10-CM | POA: Diagnosis present

## 2019-05-05 DIAGNOSIS — F17210 Nicotine dependence, cigarettes, uncomplicated: Secondary | ICD-10-CM | POA: Diagnosis present

## 2019-06-16 DIAGNOSIS — G40909 Epilepsy, unspecified, not intractable, without status epilepticus: Secondary | ICD-10-CM | POA: Diagnosis not present

## 2019-06-16 DIAGNOSIS — I209 Angina pectoris, unspecified: Secondary | ICD-10-CM | POA: Diagnosis not present

## 2019-06-16 DIAGNOSIS — I214 Non-ST elevation (NSTEMI) myocardial infarction: Secondary | ICD-10-CM | POA: Diagnosis not present

## 2019-06-16 DIAGNOSIS — Z720 Tobacco use: Secondary | ICD-10-CM | POA: Diagnosis not present

## 2019-06-18 DIAGNOSIS — K625 Hemorrhage of anus and rectum: Secondary | ICD-10-CM | POA: Diagnosis not present

## 2019-06-18 DIAGNOSIS — Z6828 Body mass index (BMI) 28.0-28.9, adult: Secondary | ICD-10-CM | POA: Diagnosis not present

## 2019-06-30 DIAGNOSIS — K625 Hemorrhage of anus and rectum: Secondary | ICD-10-CM | POA: Diagnosis not present

## 2019-07-10 DIAGNOSIS — I214 Non-ST elevation (NSTEMI) myocardial infarction: Secondary | ICD-10-CM | POA: Diagnosis not present

## 2019-07-10 DIAGNOSIS — J440 Chronic obstructive pulmonary disease with acute lower respiratory infection: Secondary | ICD-10-CM | POA: Diagnosis not present

## 2019-07-24 DIAGNOSIS — Z01818 Encounter for other preprocedural examination: Secondary | ICD-10-CM | POA: Diagnosis not present

## 2019-07-27 DIAGNOSIS — K6389 Other specified diseases of intestine: Secondary | ICD-10-CM | POA: Diagnosis not present

## 2019-07-27 DIAGNOSIS — D128 Benign neoplasm of rectum: Secondary | ICD-10-CM | POA: Diagnosis not present

## 2019-07-27 DIAGNOSIS — E7800 Pure hypercholesterolemia, unspecified: Secondary | ICD-10-CM | POA: Diagnosis not present

## 2019-07-27 DIAGNOSIS — K625 Hemorrhage of anus and rectum: Secondary | ICD-10-CM | POA: Diagnosis not present

## 2019-07-27 DIAGNOSIS — D126 Benign neoplasm of colon, unspecified: Secondary | ICD-10-CM | POA: Diagnosis not present

## 2019-07-27 DIAGNOSIS — N281 Cyst of kidney, acquired: Secondary | ICD-10-CM | POA: Diagnosis not present

## 2019-07-27 DIAGNOSIS — E278 Other specified disorders of adrenal gland: Secondary | ICD-10-CM | POA: Diagnosis not present

## 2019-07-27 DIAGNOSIS — J9811 Atelectasis: Secondary | ICD-10-CM | POA: Diagnosis not present

## 2019-07-27 DIAGNOSIS — I252 Old myocardial infarction: Secondary | ICD-10-CM | POA: Diagnosis not present

## 2019-07-27 DIAGNOSIS — R935 Abnormal findings on diagnostic imaging of other abdominal regions, including retroperitoneum: Secondary | ICD-10-CM | POA: Diagnosis not present

## 2019-07-27 DIAGNOSIS — K621 Rectal polyp: Secondary | ICD-10-CM | POA: Diagnosis not present

## 2019-07-27 DIAGNOSIS — Z79899 Other long term (current) drug therapy: Secondary | ICD-10-CM | POA: Diagnosis not present

## 2019-07-27 DIAGNOSIS — Z955 Presence of coronary angioplasty implant and graft: Secondary | ICD-10-CM | POA: Diagnosis not present

## 2019-07-27 DIAGNOSIS — Z7982 Long term (current) use of aspirin: Secondary | ICD-10-CM | POA: Diagnosis not present

## 2019-08-03 DIAGNOSIS — E782 Mixed hyperlipidemia: Secondary | ICD-10-CM | POA: Diagnosis not present

## 2019-08-03 DIAGNOSIS — C189 Malignant neoplasm of colon, unspecified: Secondary | ICD-10-CM | POA: Diagnosis not present

## 2019-08-03 DIAGNOSIS — Z6829 Body mass index (BMI) 29.0-29.9, adult: Secondary | ICD-10-CM | POA: Diagnosis not present

## 2019-08-03 DIAGNOSIS — G40909 Epilepsy, unspecified, not intractable, without status epilepticus: Secondary | ICD-10-CM | POA: Diagnosis not present

## 2019-08-03 DIAGNOSIS — E559 Vitamin D deficiency, unspecified: Secondary | ICD-10-CM | POA: Diagnosis not present

## 2019-08-03 DIAGNOSIS — M545 Low back pain: Secondary | ICD-10-CM | POA: Diagnosis not present

## 2019-08-03 DIAGNOSIS — F17210 Nicotine dependence, cigarettes, uncomplicated: Secondary | ICD-10-CM | POA: Diagnosis not present

## 2019-08-03 DIAGNOSIS — Z9189 Other specified personal risk factors, not elsewhere classified: Secondary | ICD-10-CM | POA: Diagnosis not present

## 2019-08-03 DIAGNOSIS — M810 Age-related osteoporosis without current pathological fracture: Secondary | ICD-10-CM | POA: Diagnosis not present

## 2019-08-03 DIAGNOSIS — J301 Allergic rhinitis due to pollen: Secondary | ICD-10-CM | POA: Diagnosis not present

## 2019-08-03 DIAGNOSIS — Z23 Encounter for immunization: Secondary | ICD-10-CM | POA: Diagnosis not present

## 2019-08-03 DIAGNOSIS — I2510 Atherosclerotic heart disease of native coronary artery without angina pectoris: Secondary | ICD-10-CM | POA: Diagnosis not present

## 2019-08-07 DIAGNOSIS — I10 Essential (primary) hypertension: Secondary | ICD-10-CM | POA: Diagnosis not present

## 2019-08-07 DIAGNOSIS — E7849 Other hyperlipidemia: Secondary | ICD-10-CM | POA: Diagnosis not present

## 2019-08-20 DIAGNOSIS — L298 Other pruritus: Secondary | ICD-10-CM | POA: Diagnosis not present

## 2019-08-25 DIAGNOSIS — K6389 Other specified diseases of intestine: Secondary | ICD-10-CM | POA: Diagnosis not present

## 2019-08-28 DIAGNOSIS — K6389 Other specified diseases of intestine: Secondary | ICD-10-CM | POA: Diagnosis not present

## 2019-09-03 DIAGNOSIS — K55059 Acute (reversible) ischemia of intestine, part and extent unspecified: Secondary | ICD-10-CM | POA: Diagnosis not present

## 2019-09-03 DIAGNOSIS — K6389 Other specified diseases of intestine: Secondary | ICD-10-CM | POA: Diagnosis not present

## 2019-09-03 DIAGNOSIS — I219 Acute myocardial infarction, unspecified: Secondary | ICD-10-CM | POA: Diagnosis not present

## 2019-09-15 DIAGNOSIS — K6389 Other specified diseases of intestine: Secondary | ICD-10-CM | POA: Diagnosis not present

## 2019-09-15 DIAGNOSIS — Z66 Do not resuscitate: Secondary | ICD-10-CM | POA: Diagnosis not present

## 2019-09-15 DIAGNOSIS — K55059 Acute (reversible) ischemia of intestine, part and extent unspecified: Secondary | ICD-10-CM | POA: Diagnosis not present

## 2019-09-15 DIAGNOSIS — I219 Acute myocardial infarction, unspecified: Secondary | ICD-10-CM | POA: Diagnosis not present

## 2019-09-15 DIAGNOSIS — Z01810 Encounter for preprocedural cardiovascular examination: Secondary | ICD-10-CM | POA: Diagnosis not present

## 2019-09-15 DIAGNOSIS — I214 Non-ST elevation (NSTEMI) myocardial infarction: Secondary | ICD-10-CM | POA: Diagnosis not present

## 2019-09-23 DIAGNOSIS — Z955 Presence of coronary angioplasty implant and graft: Secondary | ICD-10-CM | POA: Diagnosis not present

## 2019-09-23 DIAGNOSIS — Z01810 Encounter for preprocedural cardiovascular examination: Secondary | ICD-10-CM | POA: Diagnosis not present

## 2019-09-23 DIAGNOSIS — I252 Old myocardial infarction: Secondary | ICD-10-CM | POA: Diagnosis not present

## 2019-09-23 DIAGNOSIS — I2510 Atherosclerotic heart disease of native coronary artery without angina pectoris: Secondary | ICD-10-CM | POA: Diagnosis not present

## 2019-09-23 DIAGNOSIS — I952 Hypotension due to drugs: Secondary | ICD-10-CM | POA: Diagnosis not present

## 2019-09-23 DIAGNOSIS — Z7983 Long term (current) use of bisphosphonates: Secondary | ICD-10-CM | POA: Diagnosis not present

## 2019-09-23 DIAGNOSIS — Z7982 Long term (current) use of aspirin: Secondary | ICD-10-CM | POA: Diagnosis not present

## 2019-09-23 DIAGNOSIS — G40909 Epilepsy, unspecified, not intractable, without status epilepticus: Secondary | ICD-10-CM | POA: Diagnosis present

## 2019-09-23 DIAGNOSIS — Z20822 Contact with and (suspected) exposure to covid-19: Secondary | ICD-10-CM | POA: Diagnosis present

## 2019-09-23 DIAGNOSIS — R0902 Hypoxemia: Secondary | ICD-10-CM | POA: Diagnosis not present

## 2019-09-23 DIAGNOSIS — E785 Hyperlipidemia, unspecified: Secondary | ICD-10-CM | POA: Diagnosis present

## 2019-09-23 DIAGNOSIS — E663 Overweight: Secondary | ICD-10-CM | POA: Diagnosis present

## 2019-09-23 DIAGNOSIS — C19 Malignant neoplasm of rectosigmoid junction: Secondary | ICD-10-CM | POA: Diagnosis present

## 2019-09-23 DIAGNOSIS — I248 Other forms of acute ischemic heart disease: Secondary | ICD-10-CM | POA: Diagnosis not present

## 2019-09-23 DIAGNOSIS — G92 Toxic encephalopathy: Secondary | ICD-10-CM | POA: Diagnosis not present

## 2019-09-23 DIAGNOSIS — E7800 Pure hypercholesterolemia, unspecified: Secondary | ICD-10-CM | POA: Diagnosis present

## 2019-09-23 DIAGNOSIS — Z6830 Body mass index (BMI) 30.0-30.9, adult: Secondary | ICD-10-CM | POA: Diagnosis not present

## 2019-09-23 DIAGNOSIS — Z01818 Encounter for other preprocedural examination: Secondary | ICD-10-CM | POA: Diagnosis not present

## 2019-09-23 DIAGNOSIS — C189 Malignant neoplasm of colon, unspecified: Secondary | ICD-10-CM | POA: Diagnosis not present

## 2019-09-23 DIAGNOSIS — T463X5A Adverse effect of coronary vasodilators, initial encounter: Secondary | ICD-10-CM | POA: Diagnosis not present

## 2019-09-23 DIAGNOSIS — I249 Acute ischemic heart disease, unspecified: Secondary | ICD-10-CM | POA: Diagnosis not present

## 2019-10-05 DIAGNOSIS — C187 Malignant neoplasm of sigmoid colon: Secondary | ICD-10-CM | POA: Diagnosis not present

## 2019-10-05 DIAGNOSIS — N342 Other urethritis: Secondary | ICD-10-CM | POA: Diagnosis not present

## 2019-10-05 DIAGNOSIS — R110 Nausea: Secondary | ICD-10-CM | POA: Diagnosis not present

## 2019-10-05 DIAGNOSIS — K5900 Constipation, unspecified: Secondary | ICD-10-CM | POA: Diagnosis not present

## 2019-10-09 DIAGNOSIS — I214 Non-ST elevation (NSTEMI) myocardial infarction: Secondary | ICD-10-CM | POA: Diagnosis not present

## 2019-10-09 DIAGNOSIS — I10 Essential (primary) hypertension: Secondary | ICD-10-CM | POA: Diagnosis not present

## 2019-10-26 DIAGNOSIS — N400 Enlarged prostate without lower urinary tract symptoms: Secondary | ICD-10-CM | POA: Diagnosis not present

## 2019-10-26 DIAGNOSIS — C189 Malignant neoplasm of colon, unspecified: Secondary | ICD-10-CM | POA: Diagnosis not present

## 2019-10-27 DIAGNOSIS — I209 Angina pectoris, unspecified: Secondary | ICD-10-CM | POA: Diagnosis not present

## 2019-10-27 DIAGNOSIS — I214 Non-ST elevation (NSTEMI) myocardial infarction: Secondary | ICD-10-CM | POA: Diagnosis not present

## 2019-11-09 DIAGNOSIS — I2510 Atherosclerotic heart disease of native coronary artery without angina pectoris: Secondary | ICD-10-CM | POA: Diagnosis not present

## 2019-11-09 DIAGNOSIS — I214 Non-ST elevation (NSTEMI) myocardial infarction: Secondary | ICD-10-CM | POA: Diagnosis not present

## 2019-11-09 DIAGNOSIS — I10 Essential (primary) hypertension: Secondary | ICD-10-CM | POA: Diagnosis not present

## 2019-11-09 DIAGNOSIS — Z87891 Personal history of nicotine dependence: Secondary | ICD-10-CM | POA: Diagnosis not present

## 2019-11-13 ENCOUNTER — Ambulatory Visit: Payer: Self-pay | Admitting: Urology

## 2019-12-15 ENCOUNTER — Encounter: Payer: Self-pay | Admitting: Urology

## 2019-12-15 ENCOUNTER — Other Ambulatory Visit: Payer: Self-pay

## 2019-12-15 ENCOUNTER — Ambulatory Visit (INDEPENDENT_AMBULATORY_CARE_PROVIDER_SITE_OTHER): Payer: Medicare Other | Admitting: Urology

## 2019-12-15 VITALS — BP 121/66 | HR 64 | Temp 98.2°F | Ht 72.0 in | Wt 220.0 lb

## 2019-12-15 DIAGNOSIS — N402 Nodular prostate without lower urinary tract symptoms: Secondary | ICD-10-CM | POA: Diagnosis not present

## 2019-12-15 DIAGNOSIS — N401 Enlarged prostate with lower urinary tract symptoms: Secondary | ICD-10-CM | POA: Diagnosis not present

## 2019-12-15 DIAGNOSIS — R350 Frequency of micturition: Secondary | ICD-10-CM | POA: Diagnosis not present

## 2019-12-15 LAB — POCT URINALYSIS DIPSTICK
Bilirubin, UA: NEGATIVE
Blood, UA: NEGATIVE
Glucose, UA: NEGATIVE
Ketones, UA: NEGATIVE
Leukocytes, UA: NEGATIVE
Nitrite, UA: NEGATIVE
Protein, UA: NEGATIVE
Spec Grav, UA: 1.015 (ref 1.010–1.025)
Urobilinogen, UA: 0.2 E.U./dL
pH, UA: 6.5 (ref 5.0–8.0)

## 2019-12-15 MED ORDER — ALFUZOSIN HCL ER 10 MG PO TB24: 10.0000 mg | ORAL_TABLET | Freq: Every day | ORAL | 11 refills | Status: DC

## 2019-12-15 NOTE — Progress Notes (Signed)
See progress note.

## 2019-12-15 NOTE — Progress Notes (Signed)
H&P  Chief Complaint: Urinary issues  History of Present Illness: 79 year old male sent by Dr. Kern Alberta for urinary issues.  The patient states that he has urinary frequency during the day especially, intermittent, slow stream, feeling of incomplete emptying.  Apparently, he was started on Flomax a while back.  He states that this did not help significantly.  He denies seeing a urologist in the past.  He denies urinary tract infections.  He denies hematuria.  Past Medical History:  Diagnosis Date  . Anxiety   . Cataracts, bilateral   . Colon cancer ([place])   . Depression   . Epilepsy ([place])   . Glaucoma   . Heart attack [facility])     Past Surgical History:  Procedure Laterality Date  . CHOLECYSTECTOMY      Home Medications:  Allergies as of 12/15/2019   No Known Allergies     Medication List       Accurate as of December 15, 2019 12:16 PM. If you have any questions, ask your nurse or doctor.        acetaminophen 500 MG tablet Commonly known as: TYLENOL Take by mouth.   alfuzosin 10 MG 24 hr tablet Commonly known as: UROXATRAL Take 1 tablet (10 mg total) by mouth daily with breakfast. Started by: Jorja Loa, MD   aspirin 81 MG EC tablet Take by mouth.   dorzolamide-timolol 22.3-6.8 MG/ML ophthalmic solution Commonly known as: COSOPT 1 drop 3 (three) times daily.   Gentle Laxative 5 MG EC tablet Generic drug: bisacodyl Take by mouth.   hydrOXYzine 25 MG tablet Commonly known as: ATARAX/VISTARIL TAKE 1 TABLET BY MOUTH FOUR TIMES DAILY   latanoprost 0.005 % ophthalmic solution Commonly known as: XALATAN 1 drop at bedtime.   nitroGLYCERIN 0.4 MG SL tablet Commonly known as: NITROSTAT Place under the tongue.   ondansetron 4 MG disintegrating tablet Commonly known as: ZOFRAN-ODT Take by mouth.   PHENobarbital 32.4 MG tablet Commonly known as: LUMINAL TAKE 2 TABLETS BY MOUTH EVERY NIGHT AT BEDTIME What changed: Another medication with the same name  was removed. Continue taking this medication, and follow the directions you see here. Changed by: Jorja Loa, MD   phenytoin 100 MG ER capsule Commonly known as: DILANTIN Take by mouth. What changed: Another medication with the same name was removed. Continue taking this medication, and follow the directions you see here. Changed by: Jorja Loa, MD   polyethylene glycol powder 17 GM/SCOOP powder Commonly known as: GLYCOLAX/MIRALAX SMARTSIG:8.5 By Mouth Daily   rosuvastatin 40 MG tablet Commonly known as: CRESTOR Take 40 mg by mouth daily.   tamsulosin 0.4 MG Caps capsule Commonly known as: FLOMAX Take 0.4 mg by mouth 2 (two) times daily.       Allergies: No Known Allergies  Family History  Problem Relation Age of Onset  . Diabetes Father   . Diabetes Sister     Social History:  reports that he quit smoking about 8 months ago. His smoking use included cigarettes. He has a 100.00 pack-year smoking history. He has never used smokeless tobacco. He reports previous alcohol use. No history on file for drug use.  ROS: Urological Symptom Review  Patient is experiencing the following symptoms: Get up at night to urinate  Kidney stones Review of Systems Gastrointestinal (upper)  : Negative for upper GI symptoms Gastrointestinal (lower) : Negative for lower GI symptoms Constitutional : Negative for symptoms Skin: Negative for skin symptoms Eyes: Negative for eye symptoms  Ear/Nose/Throat : Negative for Ear/Nose/Throat symptoms Hematologic/Lymphatic: Negative for Hematologic/Lymphatic symptoms Cardiovascular : Leg swelling Respiratory : Negative for respiratory symptoms Endocrine: Negative for endocrine symptoms Musculoskeletal: Negative for musculoskeletal symptoms Neurological: Negative for neurological symptoms Psychologic: Negative for psychiatric symptoms   Physical Exam:  Vital signs in last 24 hours: BP 121/66   Pulse 64   Temp 98.2  F (36.8 C)   Ht 6' (1.829 m)   Wt 220 lb (99.8 kg)   BMI 29.84 kg/m  Constitutional:  Alert and oriented, No acute distress Cardiovascular: Regular rate  Respiratory: Normal respiratory effort GI: Abdomen is obese.  Surgical scars noted.  Ventral hernia noted.  No inguinal hernias. Genitourinary: Normal male phallus, uncircumcised.  Testes are descended bilaterally and non-tender and without masses, scrotum is normal in appearance without lesions or masses, perineum is normal on inspection.  Rectal reveals a 40 g gland.  Bilateral nodularity in mid gland, approximately 4 to 5 mm each. Lymphatic: No lymphadenopathy Neurologic: Grossly intact, no focal deficits Psychiatric: Normal mood and affect  Laboratory Data:  No results for input(s): WBC, HGB, HCT, PLT in the last 72 hours.  No results for input(s): NA, K, CL, GLUCOSE, BUN, CALCIUM, CREATININE in the last 72 hours.  Invalid input(s): CO3   Results for orders placed or performed in visit on 12/15/19 (from the past 24 hour(s))  POCT urinalysis dipstick     Status: None   Collection Time: 12/15/19 11:20 AM  Result Value Ref Range   Color, UA yellow    Clarity, UA     Glucose, UA Negative Negative   Bilirubin, UA neg    Ketones, UA neg    Spec Grav, UA 1.015 1.010 - 1.025   Blood, UA neg    pH, UA 6.5 5.0 - 8.0   Protein, UA Negative Negative   Urobilinogen, UA 0.2 0.2 or 1.0 E.U./dL   Nitrite, UA neg    Leukocytes, UA Negative Negative   Appearance clear    Odor     I have reviewed prior pt notes  I have reviewed notes from referring/previous physicians  I have reviewed urinalysis results    Impression/Assessment:  1.  BPH with significant symptoms, apparently he gave Flomax to try and it did not work  2.  Nodular prostate, needs further evaluation  Plan:  1.  I will put the patient on Uroxatrol  2.  PSA is checked today  3.  I will see him back in a couple of months.  I did discuss the fact that he may  need eventual ultrasound and biopsy of his prostate

## 2019-12-16 ENCOUNTER — Ambulatory Visit: Payer: Self-pay | Admitting: Urology

## 2019-12-16 LAB — PSA: PSA: 5.5 ng/mL — ABNORMAL HIGH (ref <=4.0)

## 2019-12-24 ENCOUNTER — Telehealth: Payer: Self-pay

## 2019-12-24 NOTE — Telephone Encounter (Signed)
-----   Message from Franchot Gallo, MD sent at 12/24/2019  7:30 AM EDT ----- Notify patient that PSA is slightly elevated at 5.5.  Will discuss further at visit in September. ----- Message ----- From: Dorisann Frames, RN Sent: 12/16/2019   8:40 AM EDT To: Franchot Gallo, MD  Please review

## 2019-12-24 NOTE — Telephone Encounter (Signed)
Pt called and made aware

## 2020-01-08 DIAGNOSIS — Z87891 Personal history of nicotine dependence: Secondary | ICD-10-CM | POA: Diagnosis not present

## 2020-01-08 DIAGNOSIS — I214 Non-ST elevation (NSTEMI) myocardial infarction: Secondary | ICD-10-CM | POA: Diagnosis not present

## 2020-01-08 DIAGNOSIS — I2510 Atherosclerotic heart disease of native coronary artery without angina pectoris: Secondary | ICD-10-CM | POA: Diagnosis not present

## 2020-01-08 DIAGNOSIS — I10 Essential (primary) hypertension: Secondary | ICD-10-CM | POA: Diagnosis not present

## 2020-01-18 DIAGNOSIS — Z23 Encounter for immunization: Secondary | ICD-10-CM | POA: Diagnosis not present

## 2020-02-09 DIAGNOSIS — I2510 Atherosclerotic heart disease of native coronary artery without angina pectoris: Secondary | ICD-10-CM | POA: Diagnosis not present

## 2020-02-09 DIAGNOSIS — Z87891 Personal history of nicotine dependence: Secondary | ICD-10-CM | POA: Diagnosis not present

## 2020-02-09 DIAGNOSIS — I214 Non-ST elevation (NSTEMI) myocardial infarction: Secondary | ICD-10-CM | POA: Diagnosis not present

## 2020-02-09 DIAGNOSIS — I10 Essential (primary) hypertension: Secondary | ICD-10-CM | POA: Diagnosis not present

## 2020-02-16 ENCOUNTER — Ambulatory Visit (INDEPENDENT_AMBULATORY_CARE_PROVIDER_SITE_OTHER): Payer: Medicare Other | Admitting: Urology

## 2020-02-16 ENCOUNTER — Encounter: Payer: Self-pay | Admitting: Urology

## 2020-02-16 ENCOUNTER — Other Ambulatory Visit: Payer: Self-pay

## 2020-02-16 VITALS — BP 131/72 | HR 63 | Temp 99.0°F | Ht 72.0 in | Wt 220.0 lb

## 2020-02-16 DIAGNOSIS — R350 Frequency of micturition: Secondary | ICD-10-CM | POA: Diagnosis not present

## 2020-02-16 DIAGNOSIS — N401 Enlarged prostate with lower urinary tract symptoms: Secondary | ICD-10-CM

## 2020-02-16 LAB — URINALYSIS, ROUTINE W REFLEX MICROSCOPIC
Bilirubin, UA: NEGATIVE
Glucose, UA: NEGATIVE
Ketones, UA: NEGATIVE
Leukocytes,UA: NEGATIVE
Nitrite, UA: NEGATIVE
Protein,UA: NEGATIVE
RBC, UA: NEGATIVE
Specific Gravity, UA: 1.015 (ref 1.005–1.030)
Urobilinogen, Ur: 0.2 mg/dL (ref 0.2–1.0)
pH, UA: 7 (ref 5.0–7.5)

## 2020-02-16 NOTE — Progress Notes (Signed)
H&P  Chief Complaint: Voiding issues  History of Present Illness:   9.7.2021: Pt here for 2 month f/u after initial urological consultation for LUTS. Pt reports that his symptoms remain stable and unsatisfactory. Pt notes that he has a weak FOS and feels that he is unable to empty his bladder completely in the morning - he will void and then shortly thereafter will void a significant volume. Pt notes that he increased his alfuzosin to 2 per day but did not achieve any reduction in his LUTS.  He has multiple statements regarding abdominal girth (he blames it on air in abdomen from remote surgery).  IPSS Questionnaire (AUA-7): Over the past month.   1)  How often have you had a sensation of not emptying your bladder completely after you finish urinating?  3 - About half the time  2)  How often have you had to urinate again less than two hours after you finished urinating? 3 - About half the time  3)  How often have you found you stopped and started again several times when you urinated?  3 - About half the time  4) How difficult have you found it to postpone urination?  5 - Almost always  5) How often have you had a weak urinary stream?  2 - Less than half the time  6) How often have you had to push or strain to begin urination?  3 - About half the time  7) How many times did you most typically get up to urinate from the time you went to bed until the time you got up in the morning?  1 - 1 time  Total score:  0-7 mildly symptomatic   8-19 moderately symptomatic   20-35 severely symptomatic   QOL score: N/A  (below copied from [facility] records):  7.6.2021: 79 year old male sent by Dr. Kern Alberta for urinary issues.  The patient states that he has urinary frequency during the day especially, intermittent, slow stream, feeling of incomplete emptying. Apparently, he was started on Flomax a while back.  He states that this did not help significantly.  He denies seeing a urologist in the past.   He denies urinary tract infections.  He denies hematuria.  PSA @ prior visit 5.5, bilateral prostate nodularity noted.  Past Medical History:  Diagnosis Date  . Anxiety   . Cataracts, bilateral   . Colon cancer ([place])   . Depression   . Epilepsy ([place])   . Glaucoma   . Heart attack [facility])     Past Surgical History:  Procedure Laterality Date  . CHOLECYSTECTOMY      Home Medications:  Allergies as of 02/16/2020   No Known Allergies     Medication List       Accurate as of February 16, 2020  9:46 AM. If you have any questions, ask your nurse or doctor.        acetaminophen 500 MG tablet Commonly known as: TYLENOL Take by mouth.   alfuzosin 10 MG 24 hr tablet Commonly known as: UROXATRAL Take 1 tablet (10 mg total) by mouth daily with breakfast.   aspirin 81 MG EC tablet Take by mouth.   dorzolamide-timolol 22.3-6.8 MG/ML ophthalmic solution Commonly known as: COSOPT 1 drop 3 (three) times daily.   Gentle Laxative 5 MG EC tablet Generic drug: bisacodyl Take by mouth.   hydrOXYzine 25 MG tablet Commonly known as: ATARAX/VISTARIL TAKE 1 TABLET BY MOUTH FOUR TIMES DAILY   latanoprost 0.005 % ophthalmic  solution Commonly known as: XALATAN 1 drop at bedtime.   nitroGLYCERIN 0.4 MG SL tablet Commonly known as: NITROSTAT Place under the tongue.   ondansetron 4 MG disintegrating tablet Commonly known as: ZOFRAN-ODT Take by mouth.   PHENobarbital 32.4 MG tablet Commonly known as: LUMINAL TAKE 2 TABLETS BY MOUTH EVERY NIGHT AT BEDTIME   phenytoin 100 MG ER capsule Commonly known as: DILANTIN Take by mouth.   polyethylene glycol powder 17 GM/SCOOP powder Commonly known as: GLYCOLAX/MIRALAX SMARTSIG:8.5 By Mouth Daily   rosuvastatin 40 MG tablet Commonly known as: CRESTOR Take 40 mg by mouth daily.   tamsulosin 0.4 MG Caps capsule Commonly known as: FLOMAX Take 0.4 mg by mouth 2 (two) times daily.       Allergies: No Known Allergies  Family  History  Problem Relation Age of Onset  . Diabetes Father   . Diabetes Sister     Social History:  reports that he quit smoking about 10 months ago. His smoking use included cigarettes. He has a 100.00 pack-year smoking history. He has never used smokeless tobacco. He reports previous alcohol use. No history on file for drug use.  ROS: A complete review of systems was performed.  All systems are negative except for pertinent findings as noted.  Physical Exam:  Vital signs in last 24 hours: There were no vitals taken for this visit. Constitutional:  Alert and oriented, No acute distress Cardiovascular: Regular rate  Respiratory: Normal respiratory effort Neurologic: Grossly intact, no focal deficits Psychiatric: Normal mood and affect  I have reviewed prior pt notes  I have reviewed notes from referring/previous physicians  I have reviewed urinalysis results  I have reviewed prior PSA results  Impression/Assessment:  1. BPH w /LUTS--not well treated yet w/ meds  2. Prostate nodules w/ elevated PSA. Pt does not desire workup @ this time  Plan:  1. Pt advised regarding PCa TRUSP/bx and encouraged to undergo the procedure in order to properly assess his risk and develop his care plan. (risks/complications discussed)  2. Pt will f/u (call us to schedule appt)  after evaluating his options and preference. He was encouraged to come back for tests  CC: Dr. Kern Alberta

## 2020-02-16 NOTE — Progress Notes (Signed)

## 2020-02-17 DIAGNOSIS — Z23 Encounter for immunization: Secondary | ICD-10-CM | POA: Diagnosis not present

## 2020-02-23 DIAGNOSIS — H401131 Primary open-angle glaucoma, bilateral, mild stage: Secondary | ICD-10-CM | POA: Diagnosis not present

## 2020-03-10 DIAGNOSIS — I2510 Atherosclerotic heart disease of native coronary artery without angina pectoris: Secondary | ICD-10-CM | POA: Diagnosis not present

## 2020-03-10 DIAGNOSIS — I214 Non-ST elevation (NSTEMI) myocardial infarction: Secondary | ICD-10-CM | POA: Diagnosis not present

## 2020-03-10 DIAGNOSIS — I10 Essential (primary) hypertension: Secondary | ICD-10-CM | POA: Diagnosis not present

## 2020-03-10 DIAGNOSIS — Z87891 Personal history of nicotine dependence: Secondary | ICD-10-CM | POA: Diagnosis not present

## 2020-03-28 DIAGNOSIS — M9901 Segmental and somatic dysfunction of cervical region: Secondary | ICD-10-CM | POA: Diagnosis not present

## 2020-03-28 DIAGNOSIS — M9902 Segmental and somatic dysfunction of thoracic region: Secondary | ICD-10-CM | POA: Diagnosis not present

## 2020-04-09 DIAGNOSIS — I214 Non-ST elevation (NSTEMI) myocardial infarction: Secondary | ICD-10-CM | POA: Diagnosis not present

## 2020-04-09 DIAGNOSIS — Z87891 Personal history of nicotine dependence: Secondary | ICD-10-CM | POA: Diagnosis not present

## 2020-04-09 DIAGNOSIS — I10 Essential (primary) hypertension: Secondary | ICD-10-CM | POA: Diagnosis not present

## 2020-05-02 DIAGNOSIS — I25118 Atherosclerotic heart disease of native coronary artery with other forms of angina pectoris: Secondary | ICD-10-CM | POA: Diagnosis not present

## 2020-05-02 DIAGNOSIS — Z720 Tobacco use: Secondary | ICD-10-CM | POA: Diagnosis not present

## 2020-05-02 DIAGNOSIS — E785 Hyperlipidemia, unspecified: Secondary | ICD-10-CM | POA: Diagnosis not present

## 2020-05-31 DIAGNOSIS — I25118 Atherosclerotic heart disease of native coronary artery with other forms of angina pectoris: Secondary | ICD-10-CM | POA: Diagnosis not present

## 2020-05-31 DIAGNOSIS — Z6833 Body mass index (BMI) 33.0-33.9, adult: Secondary | ICD-10-CM | POA: Diagnosis not present

## 2020-05-31 DIAGNOSIS — E785 Hyperlipidemia, unspecified: Secondary | ICD-10-CM | POA: Diagnosis not present

## 2020-05-31 DIAGNOSIS — G40909 Epilepsy, unspecified, not intractable, without status epilepticus: Secondary | ICD-10-CM | POA: Diagnosis not present

## 2020-05-31 DIAGNOSIS — R109 Unspecified abdominal pain: Secondary | ICD-10-CM | POA: Diagnosis not present

## 2020-05-31 DIAGNOSIS — R140 Abdominal distension (gaseous): Secondary | ICD-10-CM | POA: Diagnosis not present

## 2020-05-31 DIAGNOSIS — I214 Non-ST elevation (NSTEMI) myocardial infarction: Secondary | ICD-10-CM | POA: Diagnosis not present

## 2020-05-31 DIAGNOSIS — R0989 Other specified symptoms and signs involving the circulatory and respiratory systems: Secondary | ICD-10-CM | POA: Diagnosis not present

## 2020-06-01 DIAGNOSIS — E279 Disorder of adrenal gland, unspecified: Secondary | ICD-10-CM | POA: Diagnosis not present

## 2020-06-01 DIAGNOSIS — R109 Unspecified abdominal pain: Secondary | ICD-10-CM | POA: Diagnosis not present

## 2020-06-04 DIAGNOSIS — I252 Old myocardial infarction: Secondary | ICD-10-CM | POA: Diagnosis not present

## 2020-06-04 DIAGNOSIS — Z9049 Acquired absence of other specified parts of digestive tract: Secondary | ICD-10-CM | POA: Diagnosis not present

## 2020-06-04 DIAGNOSIS — Z20822 Contact with and (suspected) exposure to covid-19: Secondary | ICD-10-CM | POA: Diagnosis not present

## 2020-06-04 DIAGNOSIS — E7800 Pure hypercholesterolemia, unspecified: Secondary | ICD-10-CM | POA: Diagnosis not present

## 2020-06-04 DIAGNOSIS — Z87891 Personal history of nicotine dependence: Secondary | ICD-10-CM | POA: Diagnosis not present

## 2020-06-04 DIAGNOSIS — B349 Viral infection, unspecified: Secondary | ICD-10-CM | POA: Diagnosis not present

## 2020-06-08 DIAGNOSIS — I25119 Atherosclerotic heart disease of native coronary artery with unspecified angina pectoris: Secondary | ICD-10-CM | POA: Diagnosis not present

## 2020-06-08 DIAGNOSIS — J449 Chronic obstructive pulmonary disease, unspecified: Secondary | ICD-10-CM | POA: Diagnosis not present

## 2020-06-08 DIAGNOSIS — G40909 Epilepsy, unspecified, not intractable, without status epilepticus: Secondary | ICD-10-CM | POA: Diagnosis not present

## 2020-06-08 DIAGNOSIS — R609 Edema, unspecified: Secondary | ICD-10-CM | POA: Diagnosis not present

## 2020-06-08 DIAGNOSIS — Z0001 Encounter for general adult medical examination with abnormal findings: Secondary | ICD-10-CM | POA: Diagnosis not present

## 2020-06-08 DIAGNOSIS — E559 Vitamin D deficiency, unspecified: Secondary | ICD-10-CM | POA: Diagnosis not present

## 2020-06-08 DIAGNOSIS — E782 Mixed hyperlipidemia: Secondary | ICD-10-CM | POA: Diagnosis not present

## 2020-06-08 DIAGNOSIS — I5032 Chronic diastolic (congestive) heart failure: Secondary | ICD-10-CM | POA: Diagnosis not present

## 2020-06-10 DIAGNOSIS — I2510 Atherosclerotic heart disease of native coronary artery without angina pectoris: Secondary | ICD-10-CM | POA: Diagnosis not present

## 2020-06-10 DIAGNOSIS — Z87891 Personal history of nicotine dependence: Secondary | ICD-10-CM | POA: Diagnosis not present

## 2020-06-10 DIAGNOSIS — I10 Essential (primary) hypertension: Secondary | ICD-10-CM | POA: Diagnosis not present

## 2020-06-10 DIAGNOSIS — I214 Non-ST elevation (NSTEMI) myocardial infarction: Secondary | ICD-10-CM | POA: Diagnosis not present

## 2020-06-27 DIAGNOSIS — J329 Chronic sinusitis, unspecified: Secondary | ICD-10-CM | POA: Diagnosis not present

## 2020-08-11 DIAGNOSIS — L820 Inflamed seborrheic keratosis: Secondary | ICD-10-CM | POA: Diagnosis not present

## 2020-08-11 DIAGNOSIS — L720 Epidermal cyst: Secondary | ICD-10-CM | POA: Diagnosis not present

## 2020-08-24 DIAGNOSIS — R0789 Other chest pain: Secondary | ICD-10-CM | POA: Diagnosis not present

## 2020-08-24 DIAGNOSIS — M25531 Pain in right wrist: Secondary | ICD-10-CM | POA: Diagnosis not present

## 2020-08-24 DIAGNOSIS — Z6835 Body mass index (BMI) 35.0-35.9, adult: Secondary | ICD-10-CM | POA: Diagnosis not present

## 2020-08-24 DIAGNOSIS — N400 Enlarged prostate without lower urinary tract symptoms: Secondary | ICD-10-CM | POA: Diagnosis not present

## 2020-08-24 DIAGNOSIS — S60512A Abrasion of left hand, initial encounter: Secondary | ICD-10-CM | POA: Diagnosis not present

## 2020-08-25 DIAGNOSIS — H401123 Primary open-angle glaucoma, left eye, severe stage: Secondary | ICD-10-CM | POA: Diagnosis not present

## 2020-09-02 DIAGNOSIS — R0789 Other chest pain: Secondary | ICD-10-CM | POA: Diagnosis not present

## 2020-09-12 DIAGNOSIS — L723 Sebaceous cyst: Secondary | ICD-10-CM | POA: Diagnosis not present

## 2020-09-12 DIAGNOSIS — R609 Edema, unspecified: Secondary | ICD-10-CM | POA: Diagnosis not present

## 2020-09-12 DIAGNOSIS — Z6835 Body mass index (BMI) 35.0-35.9, adult: Secondary | ICD-10-CM | POA: Diagnosis not present

## 2020-09-15 DIAGNOSIS — L0291 Cutaneous abscess, unspecified: Secondary | ICD-10-CM | POA: Diagnosis not present

## 2020-09-30 DIAGNOSIS — L0291 Cutaneous abscess, unspecified: Secondary | ICD-10-CM | POA: Diagnosis not present

## 2020-10-13 DIAGNOSIS — S50369A Insect bite (nonvenomous) of unspecified elbow, initial encounter: Secondary | ICD-10-CM | POA: Diagnosis not present

## 2020-10-13 DIAGNOSIS — R21 Rash and other nonspecific skin eruption: Secondary | ICD-10-CM | POA: Diagnosis not present

## 2020-10-13 DIAGNOSIS — Z6835 Body mass index (BMI) 35.0-35.9, adult: Secondary | ICD-10-CM | POA: Diagnosis not present

## 2020-10-28 DIAGNOSIS — S20469A Insect bite (nonvenomous) of unspecified back wall of thorax, initial encounter: Secondary | ICD-10-CM | POA: Diagnosis not present

## 2020-10-28 DIAGNOSIS — Z6834 Body mass index (BMI) 34.0-34.9, adult: Secondary | ICD-10-CM | POA: Diagnosis not present

## 2020-11-10 DIAGNOSIS — E785 Hyperlipidemia, unspecified: Secondary | ICD-10-CM | POA: Diagnosis not present

## 2020-11-10 DIAGNOSIS — I25118 Atherosclerotic heart disease of native coronary artery with other forms of angina pectoris: Secondary | ICD-10-CM | POA: Diagnosis not present

## 2020-12-08 DIAGNOSIS — I2510 Atherosclerotic heart disease of native coronary artery without angina pectoris: Secondary | ICD-10-CM | POA: Diagnosis not present

## 2020-12-08 DIAGNOSIS — I10 Essential (primary) hypertension: Secondary | ICD-10-CM | POA: Diagnosis not present

## 2020-12-08 DIAGNOSIS — Z87891 Personal history of nicotine dependence: Secondary | ICD-10-CM | POA: Diagnosis not present

## 2020-12-08 DIAGNOSIS — I214 Non-ST elevation (NSTEMI) myocardial infarction: Secondary | ICD-10-CM | POA: Diagnosis not present

## 2020-12-15 DIAGNOSIS — R109 Unspecified abdominal pain: Secondary | ICD-10-CM | POA: Diagnosis not present

## 2020-12-15 DIAGNOSIS — Z6834 Body mass index (BMI) 34.0-34.9, adult: Secondary | ICD-10-CM | POA: Diagnosis not present

## 2020-12-15 DIAGNOSIS — R159 Full incontinence of feces: Secondary | ICD-10-CM | POA: Diagnosis not present

## 2020-12-16 ENCOUNTER — Encounter (INDEPENDENT_AMBULATORY_CARE_PROVIDER_SITE_OTHER): Payer: Self-pay | Admitting: *Deleted

## 2020-12-26 DIAGNOSIS — D225 Melanocytic nevi of trunk: Secondary | ICD-10-CM | POA: Diagnosis not present

## 2020-12-26 DIAGNOSIS — Z1283 Encounter for screening for malignant neoplasm of skin: Secondary | ICD-10-CM | POA: Diagnosis not present

## 2021-02-06 DIAGNOSIS — E785 Hyperlipidemia, unspecified: Secondary | ICD-10-CM | POA: Diagnosis not present

## 2021-02-06 DIAGNOSIS — I25118 Atherosclerotic heart disease of native coronary artery with other forms of angina pectoris: Secondary | ICD-10-CM | POA: Diagnosis not present

## 2021-02-08 DIAGNOSIS — I214 Non-ST elevation (NSTEMI) myocardial infarction: Secondary | ICD-10-CM | POA: Diagnosis not present

## 2021-02-08 DIAGNOSIS — I2510 Atherosclerotic heart disease of native coronary artery without angina pectoris: Secondary | ICD-10-CM | POA: Diagnosis not present

## 2021-02-08 DIAGNOSIS — I10 Essential (primary) hypertension: Secondary | ICD-10-CM | POA: Diagnosis not present

## 2021-02-08 DIAGNOSIS — Z87891 Personal history of nicotine dependence: Secondary | ICD-10-CM | POA: Diagnosis not present

## 2021-02-09 ENCOUNTER — Other Ambulatory Visit: Payer: Self-pay

## 2021-02-09 ENCOUNTER — Ambulatory Visit (INDEPENDENT_AMBULATORY_CARE_PROVIDER_SITE_OTHER): Payer: Medicare Other | Admitting: Gastroenterology

## 2021-02-09 ENCOUNTER — Encounter (INDEPENDENT_AMBULATORY_CARE_PROVIDER_SITE_OTHER): Payer: Self-pay | Admitting: Gastroenterology

## 2021-02-09 DIAGNOSIS — R140 Abdominal distension (gaseous): Secondary | ICD-10-CM | POA: Diagnosis not present

## 2021-02-09 DIAGNOSIS — R151 Fecal smearing: Secondary | ICD-10-CM | POA: Diagnosis not present

## 2021-02-09 DIAGNOSIS — Z85038 Personal history of other malignant neoplasm of large intestine: Secondary | ICD-10-CM

## 2021-02-09 NOTE — Patient Instructions (Signed)
Schedule colonoscopy Patient was counseled about the benefit of implementing a low FODMAP to improve symptoms and recurrent episodes. A dietary list was provided to the patient. Start IBGard/edible peppermint oil 1 tablet every 8-12 hours as needed for bloating

## 2021-02-09 NOTE — Progress Notes (Signed)
Alan Melendez, M.D. Gastroenterology & Hepatology [facility] [address] [place], [place] [address] Primary Care Physician: Curlene Labrum, MD [place] [id number]  Referring MD: PCP  Chief Complaint: Abdominal bloating  History of Present Illness: Alan Melendez is a 80 y.o. 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  Patient comes to the room with his family member who helps during the visit encounter as he has hearing difficulties.  Patient reports that after he underwent his colon surgery, he has presented increased abdominal girth and bloating. He is moving his bowels at least once a day, but sometimes he moves his bowels 4 times a day. Sometimes he feels the urge to go but cannot move his bowels so he takes some milk of magnesia. He reports that he has presented some scant amount of stools leakage in his underwear, all of which started after he had his colon surgery.  States that his abdomen was not distended in the past and now is constantly distended which has him very concerned.  In fact, he frequently complains today about having something "messed up after the surgery" as his symptoms were not present prior to his.  Most recent cross-sectional abdominal imaging was performed on 06/01/2020 at [facility].  He underwent a CT of the abdomen and pelvis with IV contrast, was found to have enlarged prostate, a tiny left inguinal hernia, stable bilateral adrenal nodules.  No recent blood work-up is available this year.  The patient denies having any nausea, vomiting, fever, chills, hematochezia, melena, hematemesis, abdominal pain, diarrhea, jaundice, pruritus or weight loss.  Last TFT:DDUKG Last  Colonoscopy:07/27/2019, he underwent a colonoscopy but the scope could only be advanced up to 15 cm as there was presence of a large mass in this area that could not be traversed, this was injected and biopsied.  3 mm polyp was removed from the rectum.  Pathology was consistent with colon cancer.  Procedure performed by Dr. Adelina Mings.  FHx: neg for any gastrointestinal/liver disease, no malignancies Social: quit smoking 2 years ago but used to smoke heavily, neg alcohol or illicit drug use Surgical: cholecystectomy and appendectomy  Past Medical History: Past Medical History:  Diagnosis Date   Anxiety    Cataracts, bilateral    Colon cancer ([place])    Depression    Epilepsy ([place])    Glaucoma    Heart attack ([place])     Past Surgical History: Past Surgical History:  Procedure Laterality Date   CHOLECYSTECTOMY      Family History: Family History  Problem Relation Age of Onset   Diabetes Father    Diabetes Sister     Social History: Social History   Tobacco Use  Smoking Status Former   Packs/day: 2.00   Years: 50.00   Pack years: 100.00   Types: Cigarettes   Quit date: 04/12/2019   Years since quitting: 1.8  Smokeless Tobacco Never   Social History   Substance and Sexual Activity  Alcohol Use Not Currently   Social History   Substance and Sexual Activity  Drug Use Never    Allergies: No Known Allergies  Medications: Current Outpatient Medications  Medication Sig Dispense Refill   alfuzosin (UROXATRAL) 10 MG 24 hr tablet Take 1 tablet (10 mg total) by  mouth daily with breakfast. (Patient not taking: Reported on 02/16/2020) 30 tablet 11   aspirin 81 MG EC tablet Take by mouth.     dorzolamide-timolol (COSOPT) 22.3-6.8 MG/ML ophthalmic solution 1 drop 3 (three) times daily.      GENTLE LAXATIVE 5 MG EC tablet Take by mouth.      hydrOXYzine (ATARAX/VISTARIL) 25 MG tablet TAKE 1 TABLET BY MOUTH FOUR TIMES DAILY (Patient not taking: Reported on 12/15/2019)      latanoprost (XALATAN) 0.005 % ophthalmic solution 1 drop at bedtime.      nitroGLYCERIN (NITROSTAT) 0.4 MG SL tablet Place under the tongue.      ondansetron (ZOFRAN-ODT) 4 MG disintegrating tablet Take by mouth. (Patient not taking: Reported on 12/15/2019)     PHENobarbital (LUMINAL) 32.4 MG tablet TAKE 2 TABLETS BY MOUTH EVERY NIGHT AT BEDTIME (Patient not taking: Reported on 02/16/2020)     phenytoin (DILANTIN) 100 MG ER capsule Take by mouth.     polyethylene glycol powder (GLYCOLAX/MIRALAX) 17 GM/SCOOP powder SMARTSIG:8.5 By Mouth Daily (Patient not taking: Reported on 12/15/2019)     rosuvastatin (CRESTOR) 40 MG tablet Take 40 mg by mouth daily. (Patient not taking: Reported on 02/16/2020)     tamsulosin (FLOMAX) 0.4 MG CAPS capsule Take 0.4 mg by mouth 2 (two) times daily. (Patient not taking: Reported on 02/16/2020)     No current facility-administered medications for this visit.    Review of Systems: GENERAL: negative for malaise, night sweats HEENT: No changes in hearing or vision, no nose bleeds or other nasal problems. NECK: Negative for lumps, goiter, pain and significant neck swelling RESPIRATORY: Negative for cough, wheezing CARDIOVASCULAR: Negative for chest pain, leg swelling, palpitations, orthopnea GI: SEE HPI MUSCULOSKELETAL: Negative for joint pain or swelling, back pain, and muscle pain. SKIN: Negative for lesions, rash PSYCH: Negative for sleep disturbance, mood disorder and recent psychosocial stressors. HEMATOLOGY Negative for prolonged bleeding, bruising easily, and swollen nodes. ENDOCRINE: Negative for cold or heat intolerance, polyuria, polydipsia and goiter. NEURO: negative for tremor, gait imbalance, syncope and seizures. The remainder of the review of systems is noncontributory.   Physical Exam: BP 127/69 (BP Location: Left Arm, Patient Position: Sitting, Cuff Size: Small)   Pulse 67   Temp 98.1 F (36.7 C) (Oral)   Ht 6' (1.829 m)   Wt 237 lb (107.5 kg)   BMI  32.14 kg/m  GENERAL: The patient is AO x3, in no acute distress. HEENT: Head is normocephalic and atraumatic. EOMI are intact. Mouth is well hydrated and without lesions. NECK: Supple. No masses LUNGS: Clear to auscultation. No presence of rhonchi/wheezing/rales. Adequate chest expansion HEART: RRR, normal s1 and s2. ABDOMEN: mildly tender to palpation diffusely , no guarding, no peritoneal signs. Abdomen is moderately distended but not tense. BS +. No masses. EXTREMITIES: Without any cyanosis, clubbing, rash, lesions or edema. NEUROLOGIC: AOx3, no focal motor deficit. SKIN: no jaundice, no rashes   Imaging/Labs: as above  I personally reviewed and interpreted the available labs, imaging and endoscopic files.  Impression and Plan: Alan Melendez is a 80 y.o. 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  The patient has presented persistent bloating which he reports started after he underwent his most recent colon surgery.  It is possible that the symptoms are related to peristalsis changes leading to bacterial  overgrowth causing bloating which I thoroughly explained to the patient and his family member. However, both the patient and the family member are interested in pursuing a repeat colonoscopy prior to have SIBO breath testing.  I considered this is fair as he has not had full colonoscopy after he underwent his most recent resection and it would be important to evaluate the presence of other polyps or synchronous malignancy.  We could certainly perform hydrogen breath test after the colonoscopy was performed based on findings.  For now, he would benefit of improvement in a low FODMAP diet and taking peppermint oil supplements to decrease the bloating episodes.  - Schedule  colonoscopy - Patient was counseled about the benefit of implementing a low FODMAP to improve symptoms and recurrent episodes. A dietary list was provided to the patient. - Start IBGard/edible peppermint oil 1 tablet every 8-12 hours as needed for bloating - Patient and family would like to hold on performing a SIBO breath test until colonoscopy is performed  All questions were answered.      Alan Peppers, MD Gastroenterology and Hepatology [facility]

## 2021-02-21 ENCOUNTER — Encounter (INDEPENDENT_AMBULATORY_CARE_PROVIDER_SITE_OTHER): Payer: Self-pay

## 2021-02-21 ENCOUNTER — Other Ambulatory Visit (INDEPENDENT_AMBULATORY_CARE_PROVIDER_SITE_OTHER): Payer: Self-pay

## 2021-02-22 ENCOUNTER — Encounter (INDEPENDENT_AMBULATORY_CARE_PROVIDER_SITE_OTHER): Payer: Self-pay

## 2021-02-22 ENCOUNTER — Other Ambulatory Visit (INDEPENDENT_AMBULATORY_CARE_PROVIDER_SITE_OTHER): Payer: Self-pay

## 2021-02-22 ENCOUNTER — Telehealth (INDEPENDENT_AMBULATORY_CARE_PROVIDER_SITE_OTHER): Payer: Self-pay

## 2021-02-22 DIAGNOSIS — Z85038 Personal history of other malignant neoplasm of large intestine: Secondary | ICD-10-CM

## 2021-02-22 MED ORDER — PEG 3350-KCL-NA BICARB-NACL 420 G PO SOLR: 4000.0000 mL | ORAL | 0 refills | Status: DC

## 2021-02-22 NOTE — Telephone Encounter (Signed)
LeighAnn Darcie Mellone, CMA  

## 2021-03-15 DIAGNOSIS — H401112 Primary open-angle glaucoma, right eye, moderate stage: Secondary | ICD-10-CM | POA: Diagnosis not present

## 2021-03-15 NOTE — Patient Instructions (Signed)
Alan Melendez  03/15/2021     @PREFPERIOPPHARMACY @   Your procedure is scheduled on  03/21/2021.   Report to [name] at  1215  P.M.   Call this number if you have problems the morning of surgery:  [phone]   Remember:  Follow the diet and prep instructions given to you by the office.    Take these medicines the morning of surgery with A SIP OF WATER                              None    Do not wear jewelry, make-up or nail polish.  Do not wear lotions, powders, or perfumes, or deodorant.  Do not shave 48 hours prior to surgery.  Men may shave face and neck.  Do not bring valuables to the hospital.  [facility] is not responsible for any belongings or valuables.  Contacts, dentures or bridgework may not be worn into surgery.  Leave your suitcase in the car.  After surgery it may be brought to your room.  For patients admitted to the hospital, discharge time will be determined by your treatment team.  Patients discharged the day of surgery will not be allowed to drive home and must have someone with them for 24 hours.    Special instructions:   DO NOT smoke tobacco or vape for 24 hours before your procedure.  Please read over the following fact sheets that you were given. Anesthesia Post-op Instructions and Care and Recovery After Surgery      Colonoscopy, Adult, Care After This sheet gives you information about how to care for yourself after your procedure. Your health care provider may also give you more specific instructions. If you have problems or questions, contact your health care provider. What can I expect after the procedure? After the procedure, it is common to have: A small amount of blood in your stool for 24 hours after the procedure. Some gas. Mild cramping or bloating of your abdomen. Follow these instructions at home: Eating and drinking  Drink enough fluid to keep your urine pale yellow. Follow instructions from your health care  provider about eating or drinking restrictions. Resume your normal diet as instructed by your health care provider. Avoid heavy or fried foods that are hard to digest. Activity Rest as told by your health care provider. Avoid sitting for a long time without moving. Get up to take short walks every 1-2 hours. This is important to improve blood flow and breathing. Ask for help if you feel weak or unsteady. Return to your normal activities as told by your health care provider. Ask your health care provider what activities are safe for you. Managing cramping and bloating  Try walking around when you have cramps or feel bloated. Apply heat to your abdomen as told by your health care provider. Use the heat source that your health care provider recommends, such as a moist heat pack or a heating pad. Place a towel between your skin and the heat source. Leave the heat on for 20-30 minutes. Remove the heat if your skin turns bright red. This is especially important if you are unable to feel pain, heat, or cold. You may have a greater risk of getting burned. General instructions If you were given a sedative during the procedure, it can affect you for several hours. Do not drive or operate machinery until your health  care provider says that it is safe. For the first 24 hours after the procedure: Do not sign important documents. Do not drink alcohol. Do your regular daily activities at a slower pace than normal. Eat soft foods that are easy to digest. Take over-the-counter and prescription medicines only as told by your health care provider. Keep all follow-up visits as told by your health care provider. This is important. Contact a health care provider if: You have blood in your stool 2-3 days after the procedure. Get help right away if you have: More than a small spotting of blood in your stool. Large blood clots in your stool. Swelling of your abdomen. Nausea or vomiting. A fever. Increasing pain  in your abdomen that is not relieved with medicine. Summary After the procedure, it is common to have a small amount of blood in your stool. You may also have mild cramping and bloating of your abdomen. If you were given a sedative during the procedure, it can affect you for several hours. Do not drive or operate machinery until your health care provider says that it is safe. Get help right away if you have a lot of blood in your stool, nausea or vomiting, a fever, or increased pain in your abdomen. This information is not intended to replace advice given to you by your health care provider. Make sure you discuss any questions you have with your health care provider. Document Revised: 05/22/2019 Document Reviewed: 12/22/2018 Elsevier Patient Education  [place] After This sheet gives you information about how to care for yourself after your procedure. Your health care provider may also give you more specific instructions. If you have problems or questions, contact your health care provider. What can I expect after the procedure? After the procedure, it is common to have: Tiredness. Forgetfulness about what happened after the procedure. Impaired judgment for important decisions. Nausea or vomiting. Some difficulty with balance. Follow these instructions at home: For the time period you were told by your health care provider:   Rest as needed. Do not participate in activities where you could fall or become injured. Do not drive or use machinery. Do not drink alcohol. Do not take sleeping pills or medicines that cause drowsiness. Do not make important decisions or sign legal documents. Do not take care of children on your own. Eating and drinking Follow the diet that is recommended by your health care provider. Drink enough fluid to keep your urine pale yellow. If you vomit: Drink water, juice, or soup when you can drink without vomiting. Make  sure you have little or no nausea before eating solid foods. General instructions Have a responsible adult stay with you for the time you are told. It is important to have someone help care for you until you are awake and alert. Take over-the-counter and prescription medicines only as told by your health care provider. If you have sleep apnea, surgery and certain medicines can increase your risk for breathing problems. Follow instructions from your health care provider about wearing your sleep device: Anytime you are sleeping, including during daytime naps. While taking prescription pain medicines, sleeping medicines, or medicines that make you drowsy. Avoid smoking. Keep all follow-up visits as told by your health care provider. This is important. Contact a health care provider if: You keep feeling nauseous or you keep vomiting. You feel light-headed. You are still sleepy or having trouble with balance after 24 hours. You develop a rash. You have  a fever. You have redness or swelling around the IV site. Get help right away if: You have trouble breathing. You have new-onset confusion at home. Summary For several hours after your procedure, you may feel tired. You may also be forgetful and have poor judgment. Have a responsible adult stay with you for the time you are told. It is important to have someone help care for you until you are awake and alert. Rest as told. Do not drive or operate machinery. Do not drink alcohol or take sleeping pills. Get help right away if you have trouble breathing, or if you suddenly become confused. This information is not intended to replace advice given to you by your health care provider. Make sure you discuss any questions you have with your health care provider. Document Revised: 02/11/2020 Document Reviewed: 04/30/2019 Elsevier Patient Education  2022 [employer].

## 2021-03-17 ENCOUNTER — Encounter (HOSPITAL_COMMUNITY): Payer: Self-pay

## 2021-03-17 ENCOUNTER — Encounter (HOSPITAL_COMMUNITY)
Admission: RE | Admit: 2021-03-17 | Discharge: 2021-03-17 | Disposition: A | Discharge status: Home / Self Care | Payer: Medicare Other | Source: Ambulatory Visit | Attending: Gastroenterology | Admitting: Gastroenterology

## 2021-03-17 DIAGNOSIS — Z01818 Encounter for other preprocedural examination: Secondary | ICD-10-CM | POA: Diagnosis not present

## 2021-03-17 LAB — BASIC METABOLIC PANEL
Anion gap: 7 (ref 5–15)
BUN: 16 mg/dL (ref 8–23)
CO2: 29 mmol/L (ref 22–32)
Calcium: 9.3 mg/dL (ref 8.9–10.3)
Chloride: 102 mmol/L (ref 98–111)
Creatinine, Ser: 0.84 mg/dL (ref 0.61–1.24)
GFR, Estimated: >60 mL/min (ref >60)
Glucose, Bld: 90 mg/dL (ref 70–99)
Potassium: 3.5 mmol/L (ref 3.5–5.1)
Sodium: 138 mmol/L (ref 135–145)

## 2021-03-21 ENCOUNTER — Encounter (HOSPITAL_COMMUNITY)
Admission: RE | Disposition: A | Discharge status: Home / Self Care | Payer: Self-pay | Source: Home / Self Care | Attending: Gastroenterology

## 2021-03-21 ENCOUNTER — Encounter (HOSPITAL_COMMUNITY): Payer: Self-pay | Admitting: Gastroenterology

## 2021-03-21 ENCOUNTER — Ambulatory Visit (HOSPITAL_COMMUNITY): Payer: Medicare Other | Admitting: Anesthesiology

## 2021-03-21 ENCOUNTER — Ambulatory Visit (HOSPITAL_COMMUNITY)
Admission: RE | Admit: 2021-03-21 | Discharge: 2021-03-21 | Disposition: A | Discharge status: Home / Self Care | Payer: Medicare Other | Attending: Gastroenterology | Admitting: Gastroenterology

## 2021-03-21 DIAGNOSIS — D125 Benign neoplasm of sigmoid colon: Secondary | ICD-10-CM | POA: Insufficient documentation

## 2021-03-21 DIAGNOSIS — Z85038 Personal history of other malignant neoplasm of large intestine: Secondary | ICD-10-CM | POA: Insufficient documentation

## 2021-03-21 DIAGNOSIS — D123 Benign neoplasm of transverse colon: Secondary | ICD-10-CM | POA: Insufficient documentation

## 2021-03-21 DIAGNOSIS — D120 Benign neoplasm of cecum: Secondary | ICD-10-CM | POA: Insufficient documentation

## 2021-03-21 DIAGNOSIS — Z08 Encounter for follow-up examination after completed treatment for malignant neoplasm: Secondary | ICD-10-CM | POA: Diagnosis not present

## 2021-03-21 DIAGNOSIS — R140 Abdominal distension (gaseous): Secondary | ICD-10-CM | POA: Insufficient documentation

## 2021-03-21 DIAGNOSIS — Z9049 Acquired absence of other specified parts of digestive tract: Secondary | ICD-10-CM | POA: Diagnosis not present

## 2021-03-21 DIAGNOSIS — I252 Old myocardial infarction: Secondary | ICD-10-CM | POA: Diagnosis not present

## 2021-03-21 DIAGNOSIS — Z79899 Other long term (current) drug therapy: Secondary | ICD-10-CM | POA: Diagnosis not present

## 2021-03-21 DIAGNOSIS — I2510 Atherosclerotic heart disease of native coronary artery without angina pectoris: Secondary | ICD-10-CM | POA: Diagnosis not present

## 2021-03-21 DIAGNOSIS — D124 Benign neoplasm of descending colon: Secondary | ICD-10-CM | POA: Insufficient documentation

## 2021-03-21 DIAGNOSIS — Z955 Presence of coronary angioplasty implant and graft: Secondary | ICD-10-CM | POA: Insufficient documentation

## 2021-03-21 DIAGNOSIS — Z980 Intestinal bypass and anastomosis status: Secondary | ICD-10-CM | POA: Insufficient documentation

## 2021-03-21 DIAGNOSIS — Z87891 Personal history of nicotine dependence: Secondary | ICD-10-CM | POA: Insufficient documentation

## 2021-03-21 DIAGNOSIS — Z7982 Long term (current) use of aspirin: Secondary | ICD-10-CM | POA: Insufficient documentation

## 2021-03-21 DIAGNOSIS — K635 Polyp of colon: Secondary | ICD-10-CM | POA: Diagnosis not present

## 2021-03-21 DIAGNOSIS — Z1211 Encounter for screening for malignant neoplasm of colon: Secondary | ICD-10-CM | POA: Diagnosis not present

## 2021-03-21 HISTORY — PX: HEMOSTASIS CLIP PLACEMENT: SHX6857

## 2021-03-21 HISTORY — PX: POLYPECTOMY: SHX5525

## 2021-03-21 HISTORY — PX: SUBMUCOSAL TATTOO INJECTION: SHX6856

## 2021-03-21 HISTORY — PX: COLONOSCOPY WITH PROPOFOL: SHX5780

## 2021-03-21 LAB — HM COLONOSCOPY

## 2021-03-21 SURGERY — COLONOSCOPY WITH PROPOFOL
Anesthesia: General

## 2021-03-21 MED ORDER — SODIUM CHLORIDE 0.9 % IV SOLN: INTRAVENOUS | Status: DC

## 2021-03-21 MED ORDER — SPOT INK MARKER SYRINGE KIT
PACK | SUBMUCOSAL | Status: DC | PRN
  Administered 2021-03-21: 2 mL via SUBMUCOSAL

## 2021-03-21 MED ORDER — LIDOCAINE HCL (CARDIAC) PF 100 MG/5ML IV SOSY
PREFILLED_SYRINGE | INTRAVENOUS | Status: DC | PRN
  Administered 2021-03-21: 100 mg via INTRAVENOUS

## 2021-03-21 MED ORDER — LACTATED RINGERS IV SOLN
INTRAVENOUS | Status: DC
  Administered 2021-03-21: 1000 mL via INTRAVENOUS

## 2021-03-21 MED ORDER — PROPOFOL 10 MG/ML IV BOLUS
INTRAVENOUS | Status: DC | PRN
  Administered 2021-03-21: 80 mg via INTRAVENOUS
  Administered 2021-03-21: 100 ug/kg/min via INTRAVENOUS

## 2021-03-21 MED ORDER — STERILE WATER FOR IRRIGATION IR SOLN
Status: DC | PRN
  Administered 2021-03-21: 100 mL

## 2021-03-21 NOTE — Discharge Instructions (Addendum)
You are being discharged to home.  Resume your previous diet.  We are waiting for your pathology results.  Your physician has recommended a repeat colonoscopy in six months for surveillance.  Refer for SIBO breath testing at [facility] in 2 days

## 2021-03-21 NOTE — Transfer of Care (Signed)
Immediate Anesthesia Transfer of Care Note  Patient: Alan Melendez  Procedure(s) Performed: COLONOSCOPY WITH PROPOFOL POLYPECTOMY HEMOSTASIS CLIP PLACEMENT SUBMUCOSAL TATTOO INJECTION  Patient Location: Short Stay  Anesthesia Type:General  Level of Consciousness: awake, alert , oriented and patient cooperative  Airway & Oxygen Therapy: Patient Spontanous Breathing  Post-op Assessment: Report given to RN, Post -op Vital signs reviewed and stable and Patient moving all extremities X 4  Post vital signs: Reviewed and stable  Last Vitals:  Vitals Value Taken Time  BP    Temp    Pulse    Resp    SpO2      Last Pain:  Vitals:   03/21/21 1117  TempSrc:   PainSc: 0-No pain      Patients Stated Pain Goal: 7 (69/45/03 8882)  Complications: No notable events documented.

## 2021-03-21 NOTE — H&P (Signed)
Alan Melendez is an 80 y.o. male.   Chief Complaint: bloating and for surveillance after colon cancer resection HPI: Alan Melendez is a 80 y.o. 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  Patient reports that after he underwent his colon surgery he has presented recurrent episodes of bloating.  He states that he believes "he had gas In his abdomen".  He reported he has felt significant discomfort endoscopic submucosal dissection but no pain, nausea, vomiting, fever or chills.  He underwent a CT scan of the abdomen at [facility] on 06/01/2020 which was unremarkable.  Last Colonoscopy:07/27/2019, he underwent a colonoscopy but the scope could only be advanced up to 15 cm as there was presence of a large mass in this area that could not be traversed, this was injected and biopsied.  3 mm polyp was removed from the rectum.  Pathology was consistent with colon cancer.  Procedure performed by Dr. Adelina Mings.    Past Medical History:  Diagnosis Date   Anxiety    Cataracts, bilateral    Colon cancer ([place])    Depression    Epilepsy ([place])    Glaucoma    Heart attack ([employer])     Past Surgical History:  Procedure Laterality Date   APPENDECTOMY     CHOLECYSTECTOMY     coloonscopy     CORONARY ANGIOPLASTY      Family History  Problem Relation Age of Onset   Diabetes Father    Diabetes Sister    Social History:  reports that he quit smoking about 23 months ago. His smoking use included cigarettes. He has a 100.00 pack-year smoking history. He has never used smokeless tobacco. He reports that he does not currently use alcohol. He reports that he does not use drugs.  Allergies: No Known Allergies  Medications Prior to  Admission  Medication Sig Dispense Refill   aspirin 325 MG EC tablet Take 325 mg by mouth daily.     cholecalciferol (VITAMIN D3) 25 MCG (1000 UNIT) tablet Take 1,000 Units by mouth daily.     dorzolamide-timolol (COSOPT) 22.3-6.8 MG/ML ophthalmic solution Place 1 drop into both eyes 2 (two) times daily.     furosemide (LASIX) 40 MG tablet Take 40 mg by mouth daily.     latanoprost (XALATAN) 0.005 % ophthalmic solution Place 1 drop into the right eye at bedtime.     Multiple Vitamins-Minerals (EYE HEALTH + LUTEIN PO) Take 1 tablet by mouth daily.     PHENobarbital (LUMINAL) 32.4 MG tablet Take 64.8 mg by mouth at bedtime.     phenytoin (DILANTIN) 100 MG ER capsule Take 200 mg by mouth at bedtime.     nitroGLYCERIN (NITROSTAT) 0.4 MG SL tablet Place 0.4 mg under the tongue every 5 (five) minutes as needed for chest pain.     polyethylene glycol-electrolytes (TRILYTE) 420 g solution Take 4,000 mLs by mouth as directed. 4000 mL 0    No results found for this or any previous visit (from the past 48 hour(s)). No results found.  Review of Systems  Constitutional: Negative.   HENT: Negative.    Eyes: Negative.   Gastrointestinal:  Positive for abdominal distention.  Endocrine: Negative.   Genitourinary: Negative.   Musculoskeletal: Negative.   Skin: Negative.  Allergic/Immunologic: Negative.   Neurological: Negative.   Hematological: Negative.   Psychiatric/Behavioral: Negative.     Blood pressure 138/67, pulse (!) 59, temperature 98.3 F (36.8 C), temperature source Oral, resp. rate 20, SpO2 100 %. Physical Exam  GENERAL: The patient is AO x3, in no acute distress. HEENT: Head is normocephalic and atraumatic. EOMI are intact. Mouth is well hydrated and without lesions. NECK: Supple. No masses LUNGS: Clear to auscultation. No presence of rhonchi/wheezing/rales. Adequate chest expansion HEART: RRR, normal s1 and s2. ABDOMEN: Soft, nontender, no guarding, no peritoneal signs, mildly  distended but not tense. BS +. No masses. RECTAL EXAM: no external lesions, normal tone, no masses, brown stool without blood. EXTREMITIES: Without any cyanosis, clubbing, rash, lesions or edema. NEUROLOGIC: AOx3, no focal motor deficit. SKIN: no jaundice, no rashes  Assessment/Plan Alan Melendez is a 80 y.o. 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  We will proceed with colonoscopy.  Harvel Quale, MD 03/21/2021, 10:44 AM

## 2021-03-21 NOTE — Anesthesia Postprocedure Evaluation (Signed)
Anesthesia Post Note  Patient: LANDERS PRAJAPATI  Procedure(s) Performed: COLONOSCOPY WITH PROPOFOL POLYPECTOMY HEMOSTASIS CLIP PLACEMENT SUBMUCOSAL TATTOO INJECTION  Patient location during evaluation: PACU Anesthesia Type: General Level of consciousness: awake and alert and oriented Pain management: pain level controlled Vital Signs Assessment: post-procedure vital signs reviewed and stable Respiratory status: spontaneous breathing and respiratory function stable Cardiovascular status: blood pressure returned to baseline and stable Postop Assessment: no apparent nausea or vomiting Anesthetic complications: no   No notable events documented.   Last Vitals:  Vitals:   03/21/21 0911 03/21/21 1221  BP: 138/67 (!) 99/55  Pulse: (!) 59 (!) 50  Resp: 20 16  Temp: 36.8 C 36.7 C  SpO2: 100% 100%    Last Pain:  Vitals:   03/21/21 1221  TempSrc: Axillary  PainSc: 0-No pain                 Ceci Taliaferro C Jarron Curley

## 2021-03-21 NOTE — Anesthesia Preprocedure Evaluation (Addendum)
Anesthesia Evaluation  Patient identified by MRN, date of birth, ID band Patient awake    Reviewed: Allergy & Precautions, NPO status , Patient's Chart, lab work & pertinent test results  Airway Mallampati: II  TM Distance: >3 FB Neck ROM: Full    Dental  (+) Dental Advisory Given, Upper Dentures, Partial Lower   Pulmonary former smoker,    Pulmonary exam normal breath sounds clear to auscultation       Cardiovascular Exercise Tolerance: Good + Past MI and + Cardiac Stents  Normal cardiovascular exam Rhythm:Regular Rate:Normal     Neuro/Psych Seizures -, Well Controlled,  PSYCHIATRIC DISORDERS Anxiety Depression    GI/Hepatic negative GI ROS, Neg liver ROS,   Endo/Other  negative endocrine ROS  Renal/GU negative Renal ROS     Musculoskeletal negative musculoskeletal ROS (+)   Abdominal   Peds  Hematology negative hematology ROS (+)   Anesthesia Other Findings   Reproductive/Obstetrics negative OB ROS                            Anesthesia Physical Anesthesia Plan  ASA: 3  Anesthesia Plan: General   Post-op Pain Management:    Induction: Intravenous  PONV Risk Score and Plan: TIVA  Airway Management Planned: Nasal Cannula and Natural Airway  Additional Equipment:   Intra-op Plan:   Post-operative Plan:   Informed Consent: I have reviewed the patients History and Physical, chart, labs and discussed the procedure including the risks, benefits and alternatives for the proposed anesthesia with the patient or authorized representative who has indicated his/her understanding and acceptance.     Dental advisory given  Plan Discussed with: CRNA and Surgeon  Anesthesia Plan Comments:         Anesthesia Quick Evaluation

## 2021-03-21 NOTE — Op Note (Addendum)
[facility] Patient Name: Alan Melendez Procedure Date: 03/21/2021 11:08 AM MRN: [id number] Date of Birth: 1941/01/04 Attending MD: Maylon Peppers ,  CSN: [id number] Age: 80 Admit Type: Outpatient Procedure:                Colonoscopy Indications:              High risk colon cancer surveillance: Personal                            history of colon cancer Providers:                Maylon Peppers, Rosina Lowenstein, RN, Nelma Rothman,                            Technician Referring MD:              Medicines:                Monitored Anesthesia Care Complications:            No immediate complications. Estimated Blood Loss:     Estimated blood loss: none. Procedure:                Pre-Anesthesia Assessment:                           - Prior to the procedure, a History and Physical                            was performed, and patient medications, allergies                            and sensitivities were reviewed. The patient's                            tolerance of previous anesthesia was reviewed.                           - The risks and benefits of the procedure and the                            sedation options and risks were discussed with the                            patient. All questions were answered and informed                            consent was obtained.                           After obtaining informed consent, the colonoscope                            was passed under direct vision. Throughout the                            procedure, the patient's blood pressure, pulse, and  oxygen saturations were monitored continuously. The                            [id number]-HQ190L ([id number]) scope was introduced through                            the anus and advanced to the the cecum, identified                            by appendiceal orifice and ileocecal valve. The                            colonoscopy was performed without difficulty. The                             patient tolerated the procedure well. Scope In: 11:18:40 AM Scope Out: 12:17:35 PM Scope Withdrawal Time: 0 hours 52 minutes 40 seconds  Total Procedure Duration: 0 hours 58 minutes 55 seconds  Findings:      The perianal and digital rectal examinations were normal.      Three sessile polyps were found in the cecum. The polyps were 2 to 6 mm       in size. These polyps were removed with a cold snare. Resection and       retrieval were complete.      A 25 mm polyp was found in the proximal transverse colon. The polyp was       multi-lobulated and sessile. The polyp was removed with a piecemeal       technique using a hot snare. Resection and retrieval were complete. To       prevent bleeding after the polypectomy, three hemostatic clips were       successfully placed. There was no bleeding at the end of the procedure.       A contralateral area 1 cm from the polyp was successfully injected with       2 mL [place] ink for tattooing.      Three sessile polyps were found in the sigmoid colon, descending colon       and transverse colon. The polyps were 3 to 8 mm in size. These polyps       were removed with a cold snare. Resection and retrieval were complete.      The retroflexed view of the distal rectum and anal verge was normal and       showed no anal or rectal abnormalities. Impression:               - Three 2 to 6 mm polyps in the cecum, removed with                            a cold snare. Resected and retrieved.                           - One 25 mm polyp in the proximal transverse colon,                            removed piecemeal using a hot snare. Resected and  retrieved. Clips were placed. Injected.                           - Three 3 to 8 mm polyps in the sigmoid colon, in                            the descending colon and in the transverse colon,                            removed with a cold snare. Resected and retrieved.                            - The distal rectum and anal verge are normal on                            retroflexion view. Moderate Sedation:      Per Anesthesia Care Recommendation:           - Discharge patient to home (ambulatory).                           - Resume previous diet.                           - Await pathology results.                           - Repeat colonoscopy in 6 months for surveillance.                           - Repeat CT scan of the abdomen and pelvis with IV                            contrast. Procedure Code(s):        --- Professional ---                           [phone], Colonoscopy, flexible; with removal of                            tumor(s), polyp(s), or other lesion(s) by snare                            technique                           [id number], Colonoscopy, flexible; with directed                            submucosal injection(s), any substance Diagnosis Code(s):        --- Professional ---                           K63.5, Polyp of colon  Z85.038, Personal history of other malignant                            neoplasm of large intestine CPT copyright 2019 American Medical Association. All rights reserved. The codes documented in this report are preliminary and upon coder review may  be revised to meet current compliance requirements. Maylon Peppers, MD Maylon Peppers,  03/21/2021 12:26:14 PM This report has been signed electronically. Number of Addenda: 0

## 2021-03-22 ENCOUNTER — Encounter (INDEPENDENT_AMBULATORY_CARE_PROVIDER_SITE_OTHER): Payer: Self-pay | Admitting: *Deleted

## 2021-03-22 ENCOUNTER — Other Ambulatory Visit (INDEPENDENT_AMBULATORY_CARE_PROVIDER_SITE_OTHER): Payer: Self-pay

## 2021-03-22 DIAGNOSIS — R109 Unspecified abdominal pain: Secondary | ICD-10-CM

## 2021-03-22 DIAGNOSIS — R140 Abdominal distension (gaseous): Secondary | ICD-10-CM

## 2021-03-22 LAB — SURGICAL PATHOLOGY

## 2021-03-23 ENCOUNTER — Encounter (INDEPENDENT_AMBULATORY_CARE_PROVIDER_SITE_OTHER): Payer: Self-pay

## 2021-03-27 ENCOUNTER — Telehealth (INDEPENDENT_AMBULATORY_CARE_PROVIDER_SITE_OTHER): Payer: Self-pay | Admitting: Gastroenterology

## 2021-03-27 ENCOUNTER — Encounter (HOSPITAL_COMMUNITY): Payer: Self-pay | Admitting: Gastroenterology

## 2021-03-27 NOTE — Telephone Encounter (Signed)
Patient  left voice mail message stating he wants to move his CT up if possible - please advise - ph# [phone]

## 2021-04-10 DIAGNOSIS — Z87891 Personal history of nicotine dependence: Secondary | ICD-10-CM | POA: Diagnosis not present

## 2021-04-10 DIAGNOSIS — I214 Non-ST elevation (NSTEMI) myocardial infarction: Secondary | ICD-10-CM | POA: Diagnosis not present

## 2021-04-10 DIAGNOSIS — I2510 Atherosclerotic heart disease of native coronary artery without angina pectoris: Secondary | ICD-10-CM | POA: Diagnosis not present

## 2021-04-10 DIAGNOSIS — I10 Essential (primary) hypertension: Secondary | ICD-10-CM | POA: Diagnosis not present

## 2021-04-18 ENCOUNTER — Ambulatory Visit (HOSPITAL_COMMUNITY)
Admission: RE | Admit: 2021-04-18 | Discharge: 2021-04-18 | Disposition: A | Discharge status: Home / Self Care | Payer: Medicare Other | Source: Ambulatory Visit | Attending: Gastroenterology | Admitting: Gastroenterology

## 2021-04-18 ENCOUNTER — Other Ambulatory Visit: Payer: Self-pay

## 2021-04-18 ENCOUNTER — Encounter (HOSPITAL_COMMUNITY): Payer: Self-pay | Admitting: Radiology

## 2021-04-18 DIAGNOSIS — R140 Abdominal distension (gaseous): Secondary | ICD-10-CM | POA: Insufficient documentation

## 2021-04-18 DIAGNOSIS — R109 Unspecified abdominal pain: Secondary | ICD-10-CM | POA: Insufficient documentation

## 2021-04-18 DIAGNOSIS — R1013 Epigastric pain: Secondary | ICD-10-CM | POA: Diagnosis not present

## 2021-04-18 LAB — POCT I-STAT CREATININE: Creatinine, Ser: 0.8 mg/dL (ref 0.61–1.24)

## 2021-04-18 MED ORDER — IOHEXOL 300 MG/ML  SOLN
100.0000 mL | Freq: Once | INTRAMUSCULAR | Status: AC | PRN
  Administered 2021-04-18: 100 mL via INTRAVENOUS

## 2021-04-19 ENCOUNTER — Telehealth (INDEPENDENT_AMBULATORY_CARE_PROVIDER_SITE_OTHER): Payer: Self-pay | Admitting: Gastroenterology

## 2021-04-19 DIAGNOSIS — Z6835 Body mass index (BMI) 35.0-35.9, adult: Secondary | ICD-10-CM | POA: Diagnosis not present

## 2021-04-19 DIAGNOSIS — K469 Unspecified abdominal hernia without obstruction or gangrene: Secondary | ICD-10-CM | POA: Diagnosis not present

## 2021-04-19 NOTE — Telephone Encounter (Signed)
Patient called to the office asking about the results of his CT scan of the abdomen and pelvis with IV contrast.  I explained to the patient that there were no acute alterations explain for his persistent bloating and again, there was no presence of any free air in his abdomen.  The patient was upset as he reported he was having persistent bloating sensation and he strongly believes that there was air being trapped in his abdomen "does require to have a needle to take it out".  I told him that this was not a treatment for this condition and that his imaging was not supportive of any obstruction or pneumoperitoneum.  The patient stated that he was not convinced about these findings.  He also question the fact that the CT scan did not show presence of a hernia that has been bothering him.  I advised him that the next step will be to have a SIBO breath testing to evaluate for bacterial overgrowth but the patient is not interested and he would like to have an evaluation by general surgeon.  We will refer him for evaluation of his hernia upon patient's request.  Hi Ann, can you please refer the patient to general surgery for evaluation of hernia?  He wants to see one of the providers at [facility],  Maylon Peppers, MD Gastroenterology and Hepatology [facility]

## 2021-04-19 NOTE — Telephone Encounter (Signed)
Referral placed in Epic, they will contact him with apt

## 2021-04-20 ENCOUNTER — Ambulatory Visit (INDEPENDENT_AMBULATORY_CARE_PROVIDER_SITE_OTHER): Payer: 59 | Admitting: Gastroenterology

## 2021-04-24 ENCOUNTER — Encounter: Payer: Self-pay | Admitting: Family Medicine

## 2021-04-25 DIAGNOSIS — Z6835 Body mass index (BMI) 35.0-35.9, adult: Secondary | ICD-10-CM | POA: Diagnosis not present

## 2021-04-25 DIAGNOSIS — R140 Abdominal distension (gaseous): Secondary | ICD-10-CM | POA: Diagnosis not present

## 2021-04-25 DIAGNOSIS — R590 Localized enlarged lymph nodes: Secondary | ICD-10-CM | POA: Diagnosis not present

## 2021-04-27 DIAGNOSIS — E041 Nontoxic single thyroid nodule: Secondary | ICD-10-CM | POA: Diagnosis not present

## 2021-04-27 DIAGNOSIS — R590 Localized enlarged lymph nodes: Secondary | ICD-10-CM | POA: Diagnosis not present

## 2021-04-27 DIAGNOSIS — I6523 Occlusion and stenosis of bilateral carotid arteries: Secondary | ICD-10-CM | POA: Diagnosis not present

## 2021-04-27 DIAGNOSIS — M47812 Spondylosis without myelopathy or radiculopathy, cervical region: Secondary | ICD-10-CM | POA: Diagnosis not present

## 2021-05-09 ENCOUNTER — Encounter: Payer: Self-pay | Admitting: Internal Medicine

## 2021-05-09 ENCOUNTER — Other Ambulatory Visit: Payer: Self-pay

## 2021-05-09 ENCOUNTER — Ambulatory Visit (INDEPENDENT_AMBULATORY_CARE_PROVIDER_SITE_OTHER): Payer: Medicare Other | Admitting: Internal Medicine

## 2021-05-09 VITALS — BP 130/78 | HR 74 | Temp 98.4°F | Ht 72.0 in | Wt 241.1 lb

## 2021-05-09 DIAGNOSIS — Z23 Encounter for immunization: Secondary | ICD-10-CM

## 2021-05-09 DIAGNOSIS — R0609 Other forms of dyspnea: Secondary | ICD-10-CM | POA: Diagnosis not present

## 2021-05-09 DIAGNOSIS — R911 Solitary pulmonary nodule: Secondary | ICD-10-CM

## 2021-05-09 NOTE — Patient Instructions (Addendum)
Eliminate salt from diet as much as you can   To get the most out of exercise, you need to be continuously aware that you are short of breath, but never out of breath,  building up to at least 30 minutes daily. As you improve, it will actually be easier for you to do the same amount of exercise  in  30 minutes so always push to the level where you are short of breath.     Please remember to go to the lab department   for your tests - we will call you with the results when they are available .  We need to get you PFTs next available - may need to go to [facility] to get this   I will call when all results are back to make a recommendation as to the next step

## 2021-05-09 NOTE — Assessment & Plan Note (Signed)
Onset p abd surgery ? Date - 05/09/2021   Walked on 3 x  3  lap(s) =  approx 450 ft  @ slow increased to mod pace, stopped due to end of study, no sob  with lowest 02 sats 97%    Needs pfts but I believe concern with doe is largely due to obesity and maybe component mild chf  Each maintenance medication was reviewed in detail including emphasizing most importantly the difference between maintenance and prns and under what circumstances the prns are to be triggered using an action plan format where appropriate.  Total time for H and P, chart review, counseling,  directly observing portions of ambulatory 02 saturation study/ and generating customized AVS unique to this office visit / same day charting = 60 min

## 2021-05-09 NOTE — Assessment & Plan Note (Addendum)
Quit smoking 2020 - 1st noted CT chest 08/24/19 > on CT neck 04/18/21 increased vs prior study up to 16 mm - PET  05/09/2021 >>>   Actual studies are at [facility] and best option is PET is insurance will cover s 1st getting whole lung CT >> then excisional bx if cardiac status and lung function not limiting   Discussed in detail all the  indications, usual  risks and alternatives  relative to the benefits with patient who agrees to proceed with w/u as outlined.

## 2021-05-09 NOTE — Progress Notes (Signed)
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   Sleeping as above  without nocturnal  or early am exacerbation  of respiratory  c/o's or need for noct saba. Also denies any obvious fluctuation of symptoms with weather or environmental changes or other aggravating or alleviating factors except as outlined above   No unusual exposure hx or h/o childhood pna/ asthma or knowledge of premature birth.  Current Allergies, Complete Past Medical History, Past Surgical History, Family History, and Social History were reviewed in [employer] record.  ROS  The following are not active complaints unless bolded Hoarseness, sore throat, dysphagia, dental problems, itching, sneezing,  nasal congestion or discharge of excess mucus or purulent secretions, ear ache,   fever, chills, sweats, unintended wt loss or wt gain, classically pleuritic or exertional  cp,  orthopnea pnd or arm/hand swelling  or leg swelling, presyncope, palpitations, abdominal pain, anorexia, nausea, vomiting, diarrhea  or change in bowel habits or change in bladder habits, change in stools or change in urine, dysuria, hematuria,  rash, arthralgias, visual complaints, headache, numbness, weakness or ataxia or problems with walking or coordination,  change in mood or  memory.           Past Medical History:  Diagnosis Date   Anxiety    Cataracts, bilateral    Colon cancer ([place])    Depression    Epilepsy ([place])    Glaucoma    Heart attack ([place])     Outpatient Medications Prior to Visit  Medication Sig Dispense Refill   aspirin 325 MG EC tablet Take 325 mg by mouth daily.     cholecalciferol (VITAMIN D3) 25 MCG (1000 UNIT) tablet Take 1,000 Units by mouth daily.     dorzolamide-timolol (COSOPT) 22.3-6.8 MG/ML ophthalmic solution Place 1 drop into both eyes 2 (two) times daily.     furosemide (LASIX) 40 MG tablet Take 40 mg by mouth daily.     latanoprost (XALATAN) 0.005 % ophthalmic solution Place 1 drop into the right eye at bedtime.     Multiple Vitamins-Minerals (EYE HEALTH + LUTEIN PO) Take 1 tablet by mouth daily.     nitroGLYCERIN (NITROSTAT) 0.4 MG SL tablet Place 0.4 mg under  the tongue every 5 (five) minutes as needed for chest pain.     PHENobarbital (LUMINAL) 32.4 MG tablet Take 64.8 mg by mouth at bedtime.     phenytoin (DILANTIN) 100 MG ER capsule Take 200 mg by mouth at bedtime.     No facility-administered medications prior to visit.     Objective:     BP 130/78   Pulse 74   Temp 98.4 F (36.9 C)   Ht 6' (1.829 m)   Wt 241 lb 1.9 oz (109.4 kg)   SpO2 99% Comment: ra  BMI 32.70 kg/m   SpO2: 99 % (ra)    Stoic amb wm nad/ somewhat cantankerous and fixated on bad outcome from prior surgery (gas used for lap caused permanent swelling in multiple parts of his body)   HEENT : pt wearing mask not removed for exam due to covid - 19 concerns.    NECK :  without JVD/Nodes/TM/ nl carotid upstrokes bilaterally   LUNGS: no acc muscle use,  Min barrel  contour chest wall with bilateral  slightly decreased bs s audible wheeze and  without cough on insp or exp maneuvers and min  Hyperresonant  to  percussion bilaterally     CV:  RRR  no s3 or murmur or increase in P2, and  trace pitting edema both LEs  ABD:  soft and nontender with pos end  insp Hoover's  in the supine position. No bruits or organomegaly appreciated, bowel sounds nl  MS:   Nl gait/  ext warm without deformities, calf tenderness, cyanosis or clubbing No obvious joint restrictions   SKIN: warm and dry without lesions    NEURO:  alert, approp, nl sensorium with  no motor or cerebellar deficits apparent.                 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       DOE (dyspnea on exertion) Onset p abd surgery ? Date - 05/09/2021   Walked on 3 x  3  lap(s) =  approx 450 ft  @ slow increased to mod pace, stopped due to end of study, no sob  with lowest 02 sats 97%    Needs pfts but I believe concern with doe is largely due to obesity and maybe component mild chf  Each maintenance medication was reviewed in detail including emphasizing most importantly the difference between maintenance and prns and under what circumstances the prns are to be triggered using an action plan format where appropriate.  Total time for H and P, chart review, counseling,  directly observing portions of ambulatory  02 saturation study/ and generating customized AVS unique to this office visit / same day charting = 60 min                    Alan Gully, MD 05/09/2021

## 2021-05-10 ENCOUNTER — Encounter: Payer: Self-pay | Admitting: Internal Medicine

## 2021-05-12 LAB — CBC WITH DIFFERENTIAL/PLATELET
Basophils Absolute: 0.1 10*3/uL (ref 0.0–0.2)
Basos: 1 %
EOS (ABSOLUTE): 0.3 10*3/uL (ref 0.0–0.4)
Eos: 4 %
Hematocrit: 38 % (ref 37.5–51.0)
Hemoglobin: 12.9 g/dL — ABNORMAL LOW (ref 13.0–17.7)
Immature Grans (Abs): 0 10*3/uL (ref 0.0–0.1)
Immature Granulocytes: 0 %
Lymphocytes Absolute: 1.8 10*3/uL (ref 0.7–3.1)
Lymphs: 26 %
MCH: 30.4 pg (ref 26.6–33.0)
MCHC: 33.9 g/dL (ref 31.5–35.7)
MCV: 89 fL (ref 79–97)
Monocytes Absolute: 0.6 10*3/uL (ref 0.1–0.9)
Monocytes: 9 %
Neutrophils Absolute: 4 10*3/uL (ref 1.4–7.0)
Neutrophils: 60 %
Platelets: 213 10*3/uL (ref 150–450)
RBC: 4.25 x10E6/uL (ref 4.14–5.80)
RDW: 12.6 % (ref 11.6–15.4)
WBC: 6.8 10*3/uL (ref 3.4–10.8)

## 2021-05-12 LAB — BASIC METABOLIC PANEL
BUN/Creatinine Ratio: 14 (ref 10–24)
BUN: 11 mg/dL (ref 8–27)
CO2: 26 mmol/L (ref 20–29)
Calcium: 9.6 mg/dL (ref 8.6–10.2)
Chloride: 103 mmol/L (ref 96–106)
Creatinine, Ser: 0.81 mg/dL (ref 0.76–1.27)
Glucose: 89 mg/dL (ref 70–99)
Potassium: 4.4 mmol/L (ref 3.5–5.2)
Sodium: 141 mmol/L (ref 134–144)
eGFR: 90 mL/min/{1.73_m2} (ref >59)

## 2021-05-12 LAB — QUANTIFERON-TB GOLD PLUS
QuantiFERON Mitogen Value: >10 IU/mL
QuantiFERON Nil Value: 0.08 IU/mL
QuantiFERON TB1 Ag Value: 0.07 IU/mL
QuantiFERON TB2 Ag Value: 0.07 IU/mL
QuantiFERON-TB Gold Plus: NEGATIVE

## 2021-05-12 LAB — HEPATIC FUNCTION PANEL
ALT: 8 IU/L (ref 0–44)
AST: 8 IU/L (ref 0–40)
Albumin: 4.2 g/dL (ref 3.7–4.7)
Alkaline Phosphatase: 155 IU/L — ABNORMAL HIGH (ref 44–121)
Bilirubin Total: 0.3 mg/dL (ref 0.0–1.2)
Bilirubin, Direct: 0.1 mg/dL (ref 0.00–0.40)
Total Protein: 6.6 g/dL (ref 6.0–8.5)

## 2021-05-12 LAB — BRAIN NATRIURETIC PEPTIDE: BNP: 30.3 pg/mL (ref 0.0–100.0)

## 2021-05-12 LAB — SEDIMENTATION RATE: Sed Rate: 13 mm/hr (ref 0–30)

## 2021-05-12 LAB — TSH: TSH: 2.13 u[IU]/mL (ref 0.450–4.500)

## 2021-05-12 LAB — IGE: IgE (Immunoglobulin E), Serum: 136 IU/mL (ref 6–495)

## 2021-05-15 ENCOUNTER — Other Ambulatory Visit: Payer: Self-pay

## 2021-05-15 ENCOUNTER — Telehealth: Payer: Self-pay | Admitting: Internal Medicine

## 2021-05-15 DIAGNOSIS — R911 Solitary pulmonary nodule: Secondary | ICD-10-CM

## 2021-05-15 NOTE — Telephone Encounter (Signed)
I did and it showed a very small nodule that the report says is slightly bigger than prior study so  I recommended he have a PET scan though I'm not sure insurance will cover s a full CT s contrast first but would try to go ahead and put thru the PET 1st  (I thought I had already ordered it but I don't see it pending and I apologize if the delay was on my part)

## 2021-05-15 NOTE — Telephone Encounter (Signed)
Pt calling to see if Dr. Melvyn Novas had looked at CD that was brought for appt.  Please advise  Dr.  Melvyn Novas please advise. I believe you did look over CD before seeing patient but want to be sure before telling patient. Thanks!

## 2021-05-15 NOTE — Telephone Encounter (Signed)
Called and spoke to patient. Let him know what Dr. Melvyn Novas said about CD and PET scan. Let him know that the PET scan was ordered today. He was asking if PET scan  would go over abdominal area. I let him know that the pet scan was skull to thigh. He is concerned about fluid/ "air" buildup in abdominal area that he talked to Dr. Melvyn Novas about during [place]. Will route to Dr. Melvyn Novas as an FYI of patients concerns/ questions.

## 2021-05-15 NOTE — Telephone Encounter (Signed)
ATC patient. No vm available.

## 2021-05-15 NOTE — Telephone Encounter (Signed)
Pt called back regarding CD for Dr. Melvyn Novas to review.  Stated he thought they would have scheduled PET scan the day he was here.  No order in patient's chart.  Please advise.

## 2021-05-15 NOTE — Telephone Encounter (Signed)
Yes the PET covers the abdomen but that's the same area already eval by GI doctor and GI CT so all calls re abd complaints should be referred to GI medicine  = Jenetta Downer

## 2021-05-16 ENCOUNTER — Other Ambulatory Visit: Payer: Self-pay

## 2021-05-16 ENCOUNTER — Encounter: Payer: Self-pay | Admitting: General Surgery

## 2021-05-16 ENCOUNTER — Telehealth: Payer: Self-pay | Admitting: Internal Medicine

## 2021-05-16 ENCOUNTER — Ambulatory Visit (INDEPENDENT_AMBULATORY_CARE_PROVIDER_SITE_OTHER): Payer: Medicare Other | Admitting: General Surgery

## 2021-05-16 VITALS — BP 141/74 | HR 92 | Temp 98.6°F | Resp 14 | Ht 72.0 in | Wt 239.0 lb

## 2021-05-16 DIAGNOSIS — R1900 Intra-abdominal and pelvic swelling, mass and lump, unspecified site: Secondary | ICD-10-CM | POA: Diagnosis not present

## 2021-05-16 NOTE — Telephone Encounter (Addendum)
Called the pt and there was no answer and no option to leave msg. Closing per protocol.

## 2021-05-16 NOTE — Progress Notes (Addendum)
Alan Melendez; [id number]; 1941-03-30   HPI Patient is a 80 year old white male who was referred to my care by Dr. Gar Ponto for evaluation and treatment of a possible incisional hernia.  Patient states he had swelling of his abdomen soon after his colectomy which was done at [facility].  He states the swelling has persisted and he thinks his whole body swelling may be secondary to the surgery or a hernia.  He states his legs, trunk, and arms of all swollen since the surgery.  The swelling seems to be worse.  He denies any nausea or vomiting.  It was hard to get a definitive history from the patient.  He denies any groin pain. Past Medical History:  Diagnosis Date   Anxiety    Cataracts, bilateral    Colon cancer ([place])    Depression    Epilepsy ([place])    Glaucoma    Heart attack ([place])     Past Surgical History:  Procedure Laterality Date   APPENDECTOMY     CHOLECYSTECTOMY     COLONOSCOPY WITH PROPOFOL N/A 03/21/2021   Procedure: COLONOSCOPY WITH PROPOFOL;  Surgeon: Harvel Quale, MD;  Location: [facility];  Service: Gastroenterology;  Laterality: N/A;  1:45, pt knows to arrive at 9:30   coloonscopy     [place]  03/21/2021   Procedure: [place];  Surgeon: Harvel Quale, MD;  Location: [facility];  Service: Gastroenterology;;   POLYPECTOMY  03/21/2021   Procedure: POLYPECTOMY;  Surgeon: Harvel Quale, MD;  Location: [facility];  Service: Gastroenterology;;   SUBMUCOSAL TATTOO INJECTION  03/21/2021   Procedure: SUBMUCOSAL TATTOO INJECTION;  Surgeon: Harvel Quale, MD;  Location: [facility];  Service: Gastroenterology;;    Family History  Problem Relation Age of Onset   Diabetes Father    Diabetes Sister     Current Outpatient Medications on File Prior to Visit  Medication Sig Dispense Refill   aspirin 325 MG EC tablet Take 325 mg by mouth daily.      cholecalciferol (VITAMIN D3) 25 MCG (1000 UNIT) tablet Take 1,000 Units by mouth daily.     dorzolamide-timolol (COSOPT) 22.3-6.8 MG/ML ophthalmic solution Place 1 drop into both eyes 2 (two) times daily.     furosemide (LASIX) 40 MG tablet Take 40 mg by mouth daily.     latanoprost (XALATAN) 0.005 % ophthalmic solution Place 1 drop into the right eye at bedtime.     Multiple Vitamins-Minerals (EYE HEALTH + LUTEIN PO) Take 1 tablet by mouth daily.     nitroGLYCERIN (NITROSTAT) 0.4 MG SL tablet Place 0.4 mg under the tongue every 5 (five) minutes as needed for chest pain.     PHENobarbital (LUMINAL) 32.4 MG tablet Take 64.8 mg by mouth at bedtime.     phenytoin (DILANTIN) 100 MG ER capsule Take 200 mg by mouth at bedtime.     No current facility-administered medications on file prior to visit.    No Known Allergies  Social History   Substance and Sexual Activity  Alcohol Use Not Currently    Social History   Tobacco Use  Smoking Status Former   Packs/day: 2.00   Years: 50.00   Pack years: 100.00   Types: Cigarettes   Quit date: 04/12/2019   Years since quitting: 2.0  Smokeless Tobacco Never    Review of Systems  Constitutional: Negative.   HENT: Negative.    Eyes:  Negative.   Respiratory: Negative.    Cardiovascular: Negative.   Gastrointestinal: Negative.   Genitourinary:  Positive for frequency.  Musculoskeletal: Negative.   Skin: Negative.   Neurological: Negative.   Endo/Heme/Allergies: Negative.   Psychiatric/Behavioral: Negative.     Objective   Vitals:   05/16/21 1446  BP: (!) 141/74  Pulse: 92  Resp: 14  Temp: 98.6 F (37 C)  SpO2: 96%    Physical Exam Vitals reviewed.  Constitutional:      Appearance: Normal appearance. He is obese. He is not ill-appearing.  HENT:     Head: Normocephalic and atraumatic.  Cardiovascular:     Rate and Rhythm: Normal rate and regular rhythm.     Heart sounds: Normal heart sounds. No murmur heard.   No friction  rub. No gallop.  Pulmonary:     Effort: Pulmonary effort is normal. No respiratory distress.     Breath sounds: Normal breath sounds. No stridor. No wheezing, rhonchi or rales.  Abdominal:     General: There is no distension.     Palpations: Abdomen is soft. There is no mass.     Tenderness: There is no abdominal tenderness. There is no guarding or rebound.     Hernia: No hernia is present.     Comments: Patient has a rotund abdomen.  An upper midline incision is present but I do not feel a discrete hernia.  Patient states that there may be one deeper down and I just cannot feel it.  Patient did not let me exam his inguinal regions.  Skin:    General: Skin is warm and dry.  Neurological:     Mental Status: He is alert and oriented to person, place, and time.   Multiple specialist visits reviewed on epic Assessment  Abdominal swelling.  It is difficult to a certain whether this is the patient's baseline or whether he does have some other process going on.  A recent CT scan of the abdomen was unremarkable.  He is scheduled for further imaging.  I tried to reassure him that he does not have an incisional hernia.  I told him that his abdominal wall may be more distended and splayed out, but this was not amenable to surgical intervention.  He has no groin pain, but he would not let me exam the inguinal regions. Plan  Follow-up here as needed.  He understands there is nothing I can offer from the surgical standpoint.

## 2021-05-17 NOTE — Telephone Encounter (Signed)
I called and spoke with the pt He is asking about gas buildup in his stomach  He states that he is worried something going on with his abdomen  I advised per last phone note that he needs to defer questions about this to GI  He verbalized understanding  Nothing further needed

## 2021-05-18 ENCOUNTER — Telehealth: Payer: Self-pay | Admitting: Internal Medicine

## 2021-05-19 NOTE — Telephone Encounter (Signed)
Spoke with patient regarding lab results. They verbalized understanding.  Also told him that the PET scan is of the full body from the top of his head to this knees. He expressed understanding. No further questions.  Nothing further needed at this time.

## 2021-05-19 NOTE — Telephone Encounter (Signed)
Patient would like PET scan for 05/25/2021 to be checked for whole body not just the lungs. Patient phone number is [phone].

## 2021-05-25 ENCOUNTER — Other Ambulatory Visit: Payer: Self-pay

## 2021-05-25 ENCOUNTER — Encounter (HOSPITAL_COMMUNITY)
Admission: RE | Admit: 2021-05-25 | Discharge: 2021-05-25 | Disposition: A | Discharge status: Home / Self Care | Payer: Medicare Other | Source: Ambulatory Visit | Attending: Internal Medicine | Admitting: Internal Medicine

## 2021-05-25 DIAGNOSIS — E041 Nontoxic single thyroid nodule: Secondary | ICD-10-CM | POA: Diagnosis not present

## 2021-05-25 DIAGNOSIS — R918 Other nonspecific abnormal finding of lung field: Secondary | ICD-10-CM | POA: Diagnosis not present

## 2021-05-25 DIAGNOSIS — R911 Solitary pulmonary nodule: Secondary | ICD-10-CM | POA: Diagnosis not present

## 2021-05-25 DIAGNOSIS — N402 Nodular prostate without lower urinary tract symptoms: Secondary | ICD-10-CM | POA: Diagnosis not present

## 2021-05-25 MED ORDER — FLUDEOXYGLUCOSE F - 18 (FDG) INJECTION
13.2810 | Freq: Once | INTRAVENOUS | Status: AC | PRN
  Administered 2021-05-25: 13.281 via INTRAVENOUS

## 2021-05-26 ENCOUNTER — Telehealth: Payer: Self-pay | Admitting: Internal Medicine

## 2021-05-26 NOTE — Telephone Encounter (Signed)
Called and spoke with Opal Sidles. She was calling with a call report for patient's PET scan. Radiologist is concerned about all 3 impressions. Impressions are listed below.   IMPRESSION: 1. 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 2. Hypermetabolic nodule in the RIGHT lobe of thyroid gland is indeterminate. Recommend ultrasound-guided biopsy. 3. Hypermetabolic nodule in the RIGHT lobe of the prostate gland. Recommend correlation with PSA and if elevated recommend prostate MRI.    MW, can you please advise? Thanks!

## 2021-05-30 NOTE — Telephone Encounter (Signed)
Due to concerns about pt's PET and with Dr. Melvyn Novas not currently being avail, routing to provider of the day about the call report that we received 12/16. This was routed to Dr. Melvyn Novas 12/16 after we received the call report but do not see where scan has been resulted by Dr. Melvyn Novas yet.  Tammy, please advise on this.

## 2021-05-30 NOTE — Telephone Encounter (Signed)
PET scan shows a small nodule in the RUL , that is hypermetabolic . That means will need to discuss further at office visit. Please make ov with Dr. Melvyn Novas  to discuss in detail  No enlarged or hypermetabolic lymph nodes.   Incidental finding were Thyroid and prostate abnormalities . These will need further evaluation by PCP , please make sure they make ov with PCP to discuss in detail .

## 2021-05-30 NOTE — Telephone Encounter (Signed)
Tried calling the pt and there was no answer and no voicemail ever picked up. Will hold in triage to call again later.

## 2021-05-31 NOTE — Telephone Encounter (Signed)
Per Dr. Melvyn Novas: We can go ahead and schedule him for PFTs and thoracic surgery consultation as I believe he is not going to have prohibitive numbers. If he wants to meet with me first, I can fit him in next week.   ATC patient. No VM available

## 2021-06-01 NOTE — Telephone Encounter (Signed)
Called and spoke to pt. Informed him of the recs per Dr. Melvyn Novas. Pt states he would prefer to come in to the office next week.    Dr. Melvyn Novas, you are full all next week in [place] and double booked two out of the four days. Please advise if ok to double book pt on Tuesday 28th or Wednesday 29th (you are not DB on these days but you are 100% full).    -Pt had more questions but the pt was breaking up and we could not hear one another. I tried to call pt back but the problem was still present. When nurse is calling pt back about appt, will need to answer other questions.

## 2021-06-02 NOTE — Telephone Encounter (Signed)
Called and spoke to patient.  He states he will ask Dr. Melvyn Novas his questions when he sees him on Tuesday. Appt made for 3:00 Tues. 06/05/21. Nothing further needed.

## 2021-06-06 ENCOUNTER — Ambulatory Visit (INDEPENDENT_AMBULATORY_CARE_PROVIDER_SITE_OTHER): Payer: Medicare Other | Admitting: Internal Medicine

## 2021-06-06 ENCOUNTER — Other Ambulatory Visit: Payer: Self-pay

## 2021-06-06 ENCOUNTER — Encounter: Payer: Self-pay | Admitting: Internal Medicine

## 2021-06-06 DIAGNOSIS — R911 Solitary pulmonary nodule: Secondary | ICD-10-CM | POA: Diagnosis not present

## 2021-06-06 DIAGNOSIS — R0609 Other forms of dyspnea: Secondary | ICD-10-CM | POA: Diagnosis not present

## 2021-06-06 NOTE — Patient Instructions (Addendum)
We need to schedule PFT's next available  We will schedule you for Thoracic Surgery hopefully same day you do the PFTs  in [facility]  Pulmonary follow up is as needed

## 2021-06-06 NOTE — Assessment & Plan Note (Signed)
Onset p abd surgery ? Date - 05/09/2021   Walked on 3 x  3  lap(s) =  approx 450 ft  @ slow increased to mod pace, stopped due to end of study, no sob  with lowest 02 sats 97%   - 06/06/2021   Walked on RA  x  3   lap(s) =  approx 450  ft  @ moderate to fast pace, stopped due to end of study, min sob with lowest 02 sats 95%    No evidence of a cardiac or pulmonary limitation to exertion at this point but will need pfts pre-op to see if can tol RULobectomy > ordered  Each maintenance medication was reviewed in detail including emphasizing most importantly the difference between maintenance and prns and under what circumstances the prns are to be triggered using an action plan format where appropriate.  Total time for H and P, chart review, counseling,  directly observing portions of ambulatory 02 saturation study/ and generating customized AVS unique to this office visit / same day charting  > 30 min

## 2021-06-06 NOTE — Assessment & Plan Note (Signed)
Quit smoking 2020 - 1st noted CT chest 08/24/19 > on CT neck 04/18/21 increased vs prior study up to 16 mm - PET   05/25/21 1. 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 2. Hypermetabolic nodule in the RIGHT lobe of thyroid gland is indeterminate. Recommend ultrasound-guided biopsy. 3. Hypermetabolic nodule in the RIGHT lobe of the prostate gland. Recommend correlation with PSA and if elevated recommend prostate MRI. >>>06/06/2021  rec refer to T surgery / urology 2nd and wait to eval thyroid until after 1st two eval done    The SPN is the most likely to be cancer and greatest risk short term re morbidity/ mortality so advised pfts and t surgery eval   The prostate is second most important and so referred him back to his urology, Dr Murriel Hopper.  The thyroid nodule can be eval p 1st  Two are complete/ advised will place in reminder file for 3 months  Discussed in detail all the  indications, usual  risks and alternatives  relative to the benefits with patient who agrees to proceed with w/u as outlined.

## 2021-06-06 NOTE — Progress Notes (Signed)
[facility], male    DOB: November 24, 1940    MRN: [id number]   Brief patient profile:  83 yowm quit smoking in 2020  p MI s/p 3 stents at [facility] and recovered fine but gained from 225 to 241 with doe x more slow walk or hills  = MMRC2 = can't walk a nl pace on a flat grade s sob but does fine slow and flat  > referred to pulmonary clinic in [place]  05/09/2021 by Alan Bal PA for RUL spiculated nodule 1st discovered 07/27/19 but increased in size to 16 mm on 04/28/21       History of Present Illness  05/09/2021  Pulmonary/ 1st office eval/ [facility]  Chief Complaint  Patient presents with   Consult    Abnormal CT consult.    Dyspnea:  MMRC2 = can't walk a nl pace on a flat grade s sob but does fine slow and flat  Cough: none  Sleep: ok flat/ one pillow  SABA use: no inhalers Sensation "swollen all over" since remote laparoscopic surgery and blames the gas that was used  Rec Eliminate salt from diet as much as you can  To get the most out of exercise, you need to be continuously aware that you are short of breath, but never out of breath,  building up to at least 30 minutes daily.    We need to get you PFTs next available - may need to go to [facility] to get this >not done as of 06/06/2021  I will call when all results are back to make a recommendation as to the next step   06/06/2021  f/u ov/[facility] re: spn/doe maint on no resp rex   Chief Complaint  Patient presents with   Follow-up    Here to talk to Dr. Melvyn Melendez about treatments/ Has questions for Dr. Melvyn Melendez.    Dyspnea:  MMRC1 = can walk nl pace, flat grade, can't hurry or go uphills or steps s sob   Cough: none  Sleeping: ok flat one pillow  SABA use: no inhalers 02: none  Covid status: vax x one    No obvious day to day or daytime variability or assoc excess/ purulent sputum or mucus plugs or hemoptysis or cp or chest tightness, subjective wheeze or overt sinus or hb symptoms.    Sleeping as above  without nocturnal  or early am exacerbation  of respiratory  c/o's or need for noct saba. Also denies any obvious fluctuation of symptoms with weather or environmental changes or other aggravating or alleviating factors except as outlined above   No unusual exposure hx or h/o childhood pna/ asthma or knowledge of premature birth.  Current Allergies, Complete Past Medical History, Past Surgical History, Family History, and Social History were reviewed in [employer] record.  ROS  The following are not active complaints unless bolded Hoarseness, sore throat, dysphagia, dental problems, itching, sneezing,  nasal congestion or discharge of excess mucus or purulent secretions, ear ache,   fever, chills, sweats, unintended wt loss or wt gain, classically pleuritic or exertional cp,  orthopnea pnd or arm/hand swelling  or leg swelling, presyncope, palpitations, abdominal pain, anorexia, nausea, vomiting, diarrhea  or change in bowel habits or change in bladder habits, change in stools or change in urine, dysuria, hematuria,  rash, arthralgias, visual complaints, headache, numbness, weakness or ataxia or problems with walking or coordination,  change in mood or  memory.  Current Meds  Medication Sig   aspirin 325 MG EC tablet Take 325 mg by mouth daily.   cholecalciferol (VITAMIN D3) 25 MCG (1000 UNIT) tablet Take 1,000 Units by mouth daily.   dorzolamide-timolol (COSOPT) 22.3-6.8 MG/ML ophthalmic solution Place 1 drop into both eyes 2 (two) times daily.   furosemide (LASIX) 40 MG tablet Take 40 mg by mouth daily.   latanoprost (XALATAN) 0.005 % ophthalmic solution Place 1 drop into the right eye at bedtime.   Multiple Vitamins-Minerals (EYE HEALTH + LUTEIN PO) Take 1 tablet by mouth daily.   nitroGLYCERIN (NITROSTAT) 0.4 MG SL tablet Place 0.4 mg under the tongue every 5 (five) minutes as needed for chest pain.   PHENobarbital (LUMINAL) 32.4 MG tablet  Take 64.8 mg by mouth at bedtime.   phenytoin (DILANTIN) 100 MG ER capsule Take 200 mg by mouth at bedtime.              Past Medical History:  Diagnosis Date   Anxiety    Cataracts, bilateral    Colon cancer ([place])    Depression    Epilepsy ([place])    Glaucoma    Heart attack ([place])        Objective:    Wt Readings from Last 3 Encounters:  06/06/21 241 lb 3.2 oz (109.4 kg)  05/16/21 239 lb (108.4 kg)  05/09/21 241 lb 1.9 oz (109.4 kg)      Vital signs reviewed  06/06/2021  - Note at rest 02 sats  97% on RA   General appearance:    obese wm hyperfocused on air trapped in body by previous abd surgery      HEENT : pt wearing mask not removed for exam due to covid - 19 concerns.   NECK :  without JVD/Nodes/TM/ nl carotid upstrokes bilaterally   LUNGS: no acc muscle use,  Min barrel  contour chest wall with bilateral  slightly decreased bs s audible wheeze and  without cough on insp or exp maneuvers and min  Hyperresonant  to  percussion bilaterally     CV:  RRR  no s3 or murmur or increase in P2, and no edema   ABD:  obese soft and nontender with pos end  insp Hoover's  in the supine position. No bruits or organomegaly appreciated, bowel sounds nl  MS:   Nl gait/  ext warm without deformities, calf tenderness, cyanosis or clubbing No obvious joint restrictions   SKIN: warm and dry without lesions    NEURO:  alert, approp, nl sensorium with  no motor or cerebellar deficits apparent.           Assessment

## 2021-06-07 ENCOUNTER — Other Ambulatory Visit: Payer: Self-pay

## 2021-06-07 ENCOUNTER — Telehealth: Payer: Self-pay

## 2021-06-07 DIAGNOSIS — R0609 Other forms of dyspnea: Secondary | ICD-10-CM

## 2021-06-07 NOTE — Telephone Encounter (Signed)
Called Traci patients daughter ok per dpr. She states she will be fine driving him to [facility] for a sooner PFT since according to [place] wants a pft before appt. And [facility] is the soonest option.  Order placed for PFT to be done at [facility].   Called RT at [facility] to schedule PFT for pt. LVM.

## 2021-06-11 DIAGNOSIS — N400 Enlarged prostate without lower urinary tract symptoms: Secondary | ICD-10-CM | POA: Insufficient documentation

## 2021-06-12 DIAGNOSIS — M5413 Radiculopathy, cervicothoracic region: Secondary | ICD-10-CM | POA: Diagnosis not present

## 2021-06-12 DIAGNOSIS — M9901 Segmental and somatic dysfunction of cervical region: Secondary | ICD-10-CM | POA: Diagnosis not present

## 2021-06-12 DIAGNOSIS — M9902 Segmental and somatic dysfunction of thoracic region: Secondary | ICD-10-CM | POA: Diagnosis not present

## 2021-06-16 DIAGNOSIS — Z1329 Encounter for screening for other suspected endocrine disorder: Secondary | ICD-10-CM | POA: Diagnosis not present

## 2021-06-16 DIAGNOSIS — Z0001 Encounter for general adult medical examination with abnormal findings: Secondary | ICD-10-CM | POA: Diagnosis not present

## 2021-06-16 DIAGNOSIS — E782 Mixed hyperlipidemia: Secondary | ICD-10-CM | POA: Diagnosis not present

## 2021-06-16 DIAGNOSIS — E7849 Other hyperlipidemia: Secondary | ICD-10-CM | POA: Diagnosis not present

## 2021-06-19 ENCOUNTER — Institutional Professional Consult (permissible substitution): Payer: Medicare Other | Admitting: Internal Medicine

## 2021-06-19 ENCOUNTER — Other Ambulatory Visit (HOSPITAL_COMMUNITY)
Admission: RE | Admit: 2021-06-19 | Discharge: 2021-06-19 | Disposition: A | Discharge status: Home / Self Care | Payer: Medicare Other | Source: Ambulatory Visit | Attending: Internal Medicine | Admitting: Internal Medicine

## 2021-06-19 DIAGNOSIS — Z20822 Contact with and (suspected) exposure to covid-19: Secondary | ICD-10-CM | POA: Diagnosis not present

## 2021-06-19 DIAGNOSIS — Z01812 Encounter for preprocedural laboratory examination: Secondary | ICD-10-CM | POA: Insufficient documentation

## 2021-06-19 DIAGNOSIS — R0609 Other forms of dyspnea: Secondary | ICD-10-CM

## 2021-06-19 LAB — SARS CORONAVIRUS 2 (TAT 6-24 HRS): SARS Coronavirus 2: NEGATIVE

## 2021-06-21 ENCOUNTER — Encounter: Payer: Medicare Other | Admitting: Thoracic Surgery (Cardiothoracic Vascular Surgery)

## 2021-06-21 DIAGNOSIS — I25119 Atherosclerotic heart disease of native coronary artery with unspecified angina pectoris: Secondary | ICD-10-CM | POA: Diagnosis not present

## 2021-06-21 DIAGNOSIS — G40909 Epilepsy, unspecified, not intractable, without status epilepticus: Secondary | ICD-10-CM | POA: Diagnosis not present

## 2021-06-21 DIAGNOSIS — R609 Edema, unspecified: Secondary | ICD-10-CM | POA: Diagnosis not present

## 2021-06-21 DIAGNOSIS — C3490 Malignant neoplasm of unspecified part of unspecified bronchus or lung: Secondary | ICD-10-CM | POA: Diagnosis not present

## 2021-06-21 DIAGNOSIS — Z0001 Encounter for general adult medical examination with abnormal findings: Secondary | ICD-10-CM | POA: Diagnosis not present

## 2021-06-21 DIAGNOSIS — J449 Chronic obstructive pulmonary disease, unspecified: Secondary | ICD-10-CM | POA: Diagnosis not present

## 2021-06-21 DIAGNOSIS — E559 Vitamin D deficiency, unspecified: Secondary | ICD-10-CM | POA: Diagnosis not present

## 2021-06-21 DIAGNOSIS — I5032 Chronic diastolic (congestive) heart failure: Secondary | ICD-10-CM | POA: Diagnosis not present

## 2021-06-22 ENCOUNTER — Ambulatory Visit (HOSPITAL_COMMUNITY)
Admission: RE | Admit: 2021-06-22 | Discharge: 2021-06-22 | Disposition: A | Discharge status: Home / Self Care | Payer: Medicare Other | Source: Ambulatory Visit | Attending: Internal Medicine | Admitting: Internal Medicine

## 2021-06-22 DIAGNOSIS — R0609 Other forms of dyspnea: Secondary | ICD-10-CM | POA: Insufficient documentation

## 2021-06-22 LAB — PULMONARY FUNCTION TEST
DL/VA % pred: 76 %
DL/VA: 3 ml/min/mmHg/L
DLCO unc % pred: 69 %
DLCO unc: 17.11 ml/min/mmHg
FEF 25-75 Post: 2.21 L/sec
FEF 25-75 Pre: 1.66 L/sec
FEF2575-%Change-Post: 33 %
FEF2575-%Pred-Post: 106 %
FEF2575-%Pred-Pre: 79 %
FEV1-%Change-Post: 22 %
FEV1-%Pred-Post: 76 %
FEV1-%Pred-Pre: 62 %
FEV1-Post: 2.26 L
FEV1-Pre: 1.85 L
FEV1FVC-%Change-Post: -6 %
FEV1FVC-%Pred-Pre: 104 %
FEV6-%Change-Post: 30 %
FEV6-%Pred-Post: 83 %
FEV6-%Pred-Pre: 63 %
FEV6-Post: 3.21 L
FEV6-Pre: 2.46 L
FEV6FVC-%Pred-Post: 106 %
FEV6FVC-%Pred-Pre: 106 %
FVC-%Change-Post: 30 %
FVC-%Pred-Post: 78 %
FVC-%Pred-Pre: 59 %
FVC-Post: 3.21 L
FVC-Pre: 2.46 L
Post FEV1/FVC ratio: 70 %
Post FEV6/FVC ratio: 100 %
Pre FEV1/FVC ratio: 75 %
Pre FEV6/FVC Ratio: 100 %
RV % pred: 186 %
RV: 4.87 L
TLC % pred: 112 %
TLC: 7.93 L

## 2021-06-22 MED ORDER — ALBUTEROL SULFATE (2.5 MG/3ML) 0.083% IN NEBU
2.5000 mg | INHALATION_SOLUTION | Freq: Once | RESPIRATORY_TRACT | Status: AC
  Administered 2021-06-22: 2.5 mg via RESPIRATORY_TRACT

## 2021-06-27 ENCOUNTER — Other Ambulatory Visit: Payer: Self-pay

## 2021-06-27 ENCOUNTER — Encounter: Payer: Self-pay | Admitting: Thoracic Surgery (Cardiothoracic Vascular Surgery)

## 2021-06-27 ENCOUNTER — Institutional Professional Consult (permissible substitution) (INDEPENDENT_AMBULATORY_CARE_PROVIDER_SITE_OTHER): Payer: Medicare Other | Admitting: Thoracic Surgery (Cardiothoracic Vascular Surgery)

## 2021-06-27 VITALS — BP 134/72 | HR 68 | Resp 20 | Ht 72.0 in | Wt 239.0 lb

## 2021-06-27 DIAGNOSIS — I2510 Atherosclerotic heart disease of native coronary artery without angina pectoris: Secondary | ICD-10-CM

## 2021-06-27 DIAGNOSIS — R911 Solitary pulmonary nodule: Secondary | ICD-10-CM

## 2021-06-27 DIAGNOSIS — I219 Acute myocardial infarction, unspecified: Secondary | ICD-10-CM | POA: Insufficient documentation

## 2021-06-27 NOTE — H&P (View-Only) (Signed)
PCP is [name], MD Referring Provider is Tanda Rockers, MD  Chief Complaint  Patient presents with   Lung Lesion    New patient consultation, PFT 1/12, PET 12/15, CT neck 11/18    HPI: [name] is sent for consultation regarding a right upper lobe lung nodule.  [facility] is a 81 year old man with a history of heavy tobacco abuse, COPD, CAD, MI, coronary stents, colon cancer, glaucoma, and epilepsy.  He is a very poor historian.  Fortunately he was accompanied by his daughter who is familiar with his medical history.  He smoked about 2 packs a day for almost 50 years.  He quit about 3 years ago after his heart attack.  He had a colon cancer resection in April 2021.  At some point he was found to have a right upper lobe lung nodule.  Recently he was referred to Dr. Melvyn Novas.  A PET/CT showed a 1.5 cm spiculated nodule in the right upper lobe that was markedly hypermetabolic.  There was no regional or distant metastatic disease.  He says that he gets short of breath with heavy exertion.  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  He can walk up a flight of stairs although he would be winded when he got to the top.  He can walk for a long distance on level ground without any issues.  He denies any recent chest pain, pressure, or tightness.  He last saw Dr. Curlene Labrum, his cardiologist, in August 2022.  Zubrod Score: At the time of surgery this patients most appropriate activity status/level should be described as: []     0    Normal activity, no symptoms [x]     1    Restricted in physical strenuous activity but ambulatory, able to do out light work []     2    Ambulatory and capable of self care, unable to do work activities, up and about >50 % of waking hours                              []     3    Only limited self care, in bed greater than 50% of waking hours []     4    Completely disabled, no self care, confined to bed or chair []     5    Moribund  Past  Medical History:  Diagnosis Date   Anxiety    Cataracts, bilateral    Colon cancer ([place])    Depression    Epilepsy ([place])    Glaucoma    Heart attack ([place])     Past Surgical History:  Procedure Laterality Date   APPENDECTOMY     CHOLECYSTECTOMY     COLONOSCOPY WITH PROPOFOL N/A 03/21/2021   Procedure: COLONOSCOPY WITH PROPOFOL;  Surgeon: Harvel Quale, MD;  [facility];  Service: Gastroenterology;  Laterality: N/A;  1:45, pt knows to arrive at 9:30   coloonscopy     [place]  03/21/2021   Procedure: [place];  Surgeon: Harvel Quale, MD;  Location: [facility];  Service: Gastroenterology;;   POLYPECTOMY  03/21/2021   Procedure: POLYPECTOMY;  Surgeon: Harvel Quale, MD;  Location: [facility];  Service: Gastroenterology;;   SUBMUCOSAL TATTOO INJECTION  03/21/2021   Procedure: SUBMUCOSAL TATTOO INJECTION;  Surgeon: Harvel Quale, MD;  Location:  [facility];  Service: Gastroenterology;;    Family History  Problem Relation Age of Onset   Diabetes Father    Diabetes Sister     Social History Social History   Tobacco Use   Smoking status: Former    Packs/day: 2.00    Years: 50.00    Pack years: 100.00    Types: Cigarettes    Quit date: 04/12/2019    Years since quitting: 2.2   Smokeless tobacco: Never  Vaping Use   Vaping Use: Never used  Substance Use Topics   Alcohol use: Not Currently   Drug use: Never    Current Outpatient Medications  Medication Sig Dispense Refill   aspirin 325 MG EC tablet Take 325 mg by mouth daily.     cholecalciferol (VITAMIN D3) 25 MCG (1000 UNIT) tablet Take 1,000 Units by mouth daily.     dorzolamide-timolol (COSOPT) 22.3-6.8 MG/ML ophthalmic solution Place 1 drop into both eyes 2 (two) times daily.     furosemide (LASIX) 40 MG tablet Take 40 mg by mouth daily.     latanoprost (XALATAN) 0.005 % ophthalmic solution  Place 1 drop into the right eye at bedtime.     Multiple Vitamins-Minerals (EYE HEALTH + LUTEIN PO) Take 1 tablet by mouth daily.     nitroGLYCERIN (NITROSTAT) 0.4 MG SL tablet Place 0.4 mg under the tongue every 5 (five) minutes as needed for chest pain.     PHENobarbital (LUMINAL) 32.4 MG tablet Take 64.8 mg by mouth at bedtime.     phenytoin (DILANTIN) 100 MG ER capsule Take 200 mg by mouth at bedtime.     No current facility-administered medications for this visit.    No Known Allergies  Review of Systems  Constitutional:  Positive for unexpected weight change (Weight gain).  HENT:  Positive for hearing loss. Negative for trouble swallowing and voice change.   Respiratory:  Positive for shortness of breath (With exertion). Negative for cough and wheezing.   Cardiovascular:  Positive for leg swelling.  Gastrointestinal:  Positive for abdominal distention.  Genitourinary:  Positive for difficulty urinating and frequency.       Prostate enlargement  Musculoskeletal:  Positive for arthralgias and joint swelling.  Neurological:  Negative for dizziness, seizures, weakness and headaches.  Hematological:  Negative for adenopathy. Does not bruise/bleed easily.   BP 134/72 (BP Location: Right Arm, Patient Position: Sitting, Cuff Size: Large)    Pulse 68    Resp 20    Ht 6' (1.829 m)    Wt 239 lb (108.4 kg)    SpO2 95% Comment: RA   BMI 32.41 kg/m  Physical Exam Vitals reviewed.  Constitutional:      General: He is not in acute distress.    Appearance: He is obese.  HENT:     Head: Normocephalic and atraumatic.  Eyes:     General: No scleral icterus.    Extraocular Movements: Extraocular movements intact.  Neck:     Vascular: No carotid bruit.  Cardiovascular:     Rate and Rhythm: Normal rate and regular rhythm.     Heart sounds: Murmur (2/6 systolic) heard.    No friction rub. No gallop.  Pulmonary:     Effort: Pulmonary effort is normal. No respiratory distress.     Breath  sounds: Normal breath sounds. No wheezing or rales.  Abdominal:     Palpations: Abdomen is soft.     Comments: Obese  Musculoskeletal:     Right lower leg:  No edema.     Left lower leg: No edema.  Lymphadenopathy:     Cervical: No cervical adenopathy.  Skin:    General: Skin is warm and dry.  Neurological:     General: No focal deficit present.     Mental Status: He is oriented to person, place, and time.     Cranial Nerves: No cranial nerve deficit.     Motor: No weakness.     Comments: Extremely hard of hearing     Diagnostic Tests: NUCLEAR MEDICINE PET SKULL BASE TO THIGH   TECHNIQUE: 8 mCi F-18 FDG was injected intravenously. Full-ring PET imaging was performed from the skull base to thigh after the radiotracer. CT data was obtained and used for attenuation correction and anatomic localization.   Fasting blood glucose: 102 mg/dl   COMPARISON:  None.   FINDINGS: Mediastinal blood pool activity: SUV max 2.37   Liver activity: SUV max NA   NECK: No hypermetabolic lymph nodes in the neck. Hypermetabolic nodule in the RIGHT lobe of thyroid gland with SUV max equal 5.8. Nodule measures 16 mm (image 76/3).   Incidental CT findings: none   CHEST: Within the RIGHT upper lobe, spiculated nodule measuring 14 mm (image 81) and has intense metabolic activity with SUV max equal 6.6.   No additional enlarged or hypermetabolic lymph nodes in the lungs.   No hypermetabolic mediastinal lymph nodes or supraclavicular lymph nodes.   Incidental CT findings: none   ABDOMEN/PELVIS: No abnormal hypermetabolic activity within the liver, pancreas, adrenal glands, or spleen. No hypermetabolic lymph nodes in the abdomen or pelvis.   Incidental CT findings: Enlargement of the RIGHT adrenal gland has low attenuation consistent with benign adenoma.   No abnormal activity in liver.  No abdominopelvic adenopathy.   There is a focus intense metabolic activity within the RIGHT lobe  of the prostate gland with SUV max equal 7.1 (image 258).   SKELETON: No focal hypermetabolic activity to suggest skeletal metastasis.   Incidental CT findings: none   IMPRESSION: 1. 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 2. Hypermetabolic nodule in the RIGHT lobe of thyroid gland is indeterminate. Recommend ultrasound-guided biopsy. 3. Hypermetabolic nodule in the RIGHT lobe of the prostate gland. Recommend correlation with PSA and if elevated recommend prostate MRI.   These results will be called to the ordering clinician or representative by the Radiologist Assistant, and communication documented in the PACS or [employer].     Electronically Signed   By: Suzy Bouchard M.D.   On: 05/26/2021 15:47   I personally reviewed the PET/CT images.  There is a 1.5 cm hypermetabolic nodule in the right upper lobe with no regional or distant metastatic disease.  Also hypermetabolic areas in right thyroid and prostate.  Pulmonary function testing 06/22/2021 FVC 2.46 (59%) FEV1 1.85 (62%) FEV1 2.26 (76%) postbronchodilator DLCO 17.11 (69%)   Impression: [facility] is a 81 year old man with a history of heavy tobacco abuse, COPD, CAD, MI, coronary stents, colon cancer, glaucoma, and epilepsy.  He had about 100-pack-year history of smoking prior to quitting 3 years ago when he had heart attack.  He was first found to have a lung nodule in February 2021.  On recent CT the nodule had increased in size.  He was referred to Dr. Melvyn Novas.  A PET/CT showed the nodule was markedly hypermetabolic.  There was no evidence of regional or distant metastatic disease.  Findings are consistent  with a new stage Ia primary bronchogenic carcinoma.  Metastatic colon cancer, infectious, and inflammatory nodules are also in the differential but are far less likely.  We discussed treatment options including surgical resection  versus stereotactic radiation.  We discussed the advantages and disadvantages of each.  He very strongly prefer surgery and does not want to consider radiation.  We discussed the proposed surgical procedure which would be a robotic right VATS for wedge resection and possible right upper lobectomy.  That would depend on the results of the intraoperative frozen section.  I informed him and his daughter of the general nature of the procedure including the need for general anesthesia, the incisions to be used, the use of a surgical robot, the use of carbon dioxide insufflation, use of a drainage tube postoperatively, the expected hospital stay, and the overall recovery.  I also informed him of the indications, risks, benefits, and alternatives.  They understand the risks include, but are not limited to death, MI, DVT, PE, bleeding, possible need for transfusion, infection, prolonged air leak, cardiac arrhythmias, as well as possibility of other unforeseeable complications.  He does have adequate pulmonary reserve to tolerate resection.  He does have a history of coronary disease with an MI and stents previously.  He is a very poor historian but as far as I can tell he is not having angina currently.  We will check with Dr. Curlene Labrum his cardiologist to see if he needs any additional testing.  Thyroid and prostate nodules-will need to be evaluated.   Plan: Needs cardiology clearance from Dr. Curlene Labrum in [place] Robotic right VATS for wedge resection and possible right upper lobectomy once cleared by cardiology. Ultimately will need urology and general surgery consults regarding prostate and thyroid findings  Alan Nakayama, MD [facility] [phone]

## 2021-06-27 NOTE — Progress Notes (Signed)
PCP is [name], MD Referring Provider is Tanda Rockers, MD  Chief Complaint  Patient presents with   Lung Lesion    New patient consultation, PFT 1/12, PET 12/15, CT neck 11/18    HPI: Mr. Solivan is sent for consultation regarding a right upper lobe lung nodule.  Riggs Dineen is a 81 year old man with a history of heavy tobacco abuse, COPD, CAD, MI, coronary stents, colon cancer, glaucoma, and epilepsy.  He is a very poor historian.  Fortunately he was accompanied by his daughter who is familiar with his medical history.  He smoked about 2 packs a day for almost 50 years.  He quit about 3 years ago after his heart attack.  He had a colon cancer resection in April 2021.  At some point he was found to have a right upper lobe lung nodule.  Recently he was referred to Dr. Melvyn Novas.  A PET/CT showed a 1.5 cm spiculated nodule in the right upper lobe that was markedly hypermetabolic.  There was no regional or distant metastatic disease.  He says that he gets short of breath with heavy exertion.  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  He can walk up a flight of stairs although he would be winded when he got to the top.  He can walk for a long distance on level ground without any issues.  He denies any recent chest pain, pressure, or tightness.  He last saw Dr. Curlene Labrum, his cardiologist, in August 2022.  Zubrod Score: At the time of surgery this patients most appropriate activity status/level should be described as: []     0    Normal activity, no symptoms [x]     1    Restricted in physical strenuous activity but ambulatory, able to do out light work []     2    Ambulatory and capable of self care, unable to do work activities, up and about >50 % of waking hours                              []     3    Only limited self care, in bed greater than 50% of waking hours []     4    Completely disabled, no self care, confined to bed or chair []     5    Moribund  Past  Medical History:  Diagnosis Date   Anxiety    Cataracts, bilateral    Colon cancer ([place])    Depression    Epilepsy ([place])    Glaucoma    Heart attack ([place])     Past Surgical History:  Procedure Laterality Date   APPENDECTOMY     CHOLECYSTECTOMY     COLONOSCOPY WITH PROPOFOL N/A 03/21/2021   Procedure: COLONOSCOPY WITH PROPOFOL;  Surgeon: Harvel Quale, MD;  [facility];  Service: Gastroenterology;  Laterality: N/A;  1:45, pt knows to arrive at 9:30   coloonscopy     [place]  03/21/2021   Procedure: [place];  Surgeon: Harvel Quale, MD;  Location: [facility];  Service: Gastroenterology;;   POLYPECTOMY  03/21/2021   Procedure: POLYPECTOMY;  Surgeon: Harvel Quale, MD;  [facility]: [facility];  Service: Gastroenterology;;   SUBMUCOSAL TATTOO INJECTION  03/21/2021   Procedure: SUBMUCOSAL TATTOO INJECTION;  Surgeon: Harvel Quale, MD;  [facility]:  [facility];  Service: Gastroenterology;;    Family History  Problem Relation Age of Onset   Diabetes Father    Diabetes Sister     Social History Social History   Tobacco Use   Smoking status: Former    Packs/day: 2.00    Years: 50.00    Pack years: 100.00    Types: Cigarettes    Quit date: 04/12/2019    Years since quitting: 2.2   Smokeless tobacco: Never  Vaping Use   Vaping Use: Never used  Substance Use Topics   Alcohol use: Not Currently   Drug use: Never    Current Outpatient Medications  Medication Sig Dispense Refill   aspirin 325 MG EC tablet Take 325 mg by mouth daily.     cholecalciferol (VITAMIN D3) 25 MCG (1000 UNIT) tablet Take 1,000 Units by mouth daily.     dorzolamide-timolol (COSOPT) 22.3-6.8 MG/ML ophthalmic solution Place 1 drop into both eyes 2 (two) times daily.     furosemide (LASIX) 40 MG tablet Take 40 mg by mouth daily.     latanoprost (XALATAN) 0.005 % ophthalmic solution  Place 1 drop into the right eye at bedtime.     Multiple Vitamins-Minerals (EYE HEALTH + LUTEIN PO) Take 1 tablet by mouth daily.     nitroGLYCERIN (NITROSTAT) 0.4 MG SL tablet Place 0.4 mg under the tongue every 5 (five) minutes as needed for chest pain.     PHENobarbital (LUMINAL) 32.4 MG tablet Take 64.8 mg by mouth at bedtime.     phenytoin (DILANTIN) 100 MG ER capsule Take 200 mg by mouth at bedtime.     No current facility-administered medications for this visit.    No Known Allergies  Review of Systems  Constitutional:  Positive for unexpected weight change (Weight gain).  HENT:  Positive for hearing loss. Negative for trouble swallowing and voice change.   Respiratory:  Positive for shortness of breath (With exertion). Negative for cough and wheezing.   Cardiovascular:  Positive for leg swelling.  Gastrointestinal:  Positive for abdominal distention.  Genitourinary:  Positive for difficulty urinating and frequency.       Prostate enlargement  Musculoskeletal:  Positive for arthralgias and joint swelling.  Neurological:  Negative for dizziness, seizures, weakness and headaches.  Hematological:  Negative for adenopathy. Does not bruise/bleed easily.   BP 134/72 (BP Location: Right Arm, Patient Position: Sitting, Cuff Size: Large)    Pulse 68    Resp 20    Ht 6' (1.829 m)    Wt 239 lb (108.4 kg)    SpO2 95% Comment: RA   BMI 32.41 kg/m  Physical Exam Vitals reviewed.  Constitutional:      General: He is not in acute distress.    Appearance: He is obese.  HENT:     Head: Normocephalic and atraumatic.  Eyes:     General: No scleral icterus.    Extraocular Movements: Extraocular movements intact.  Neck:     Vascular: No carotid bruit.  Cardiovascular:     Rate and Rhythm: Normal rate and regular rhythm.     Heart sounds: Murmur (2/6 systolic) heard.    No friction rub. No gallop.  Pulmonary:     Effort: Pulmonary effort is normal. No respiratory distress.     Breath  sounds: Normal breath sounds. No wheezing or rales.  Abdominal:     Palpations: Abdomen is soft.     Comments: Obese  Musculoskeletal:     Right lower leg:  No edema.     Left lower leg: No edema.  Lymphadenopathy:     Cervical: No cervical adenopathy.  Skin:    General: Skin is warm and dry.  Neurological:     General: No focal deficit present.     Mental Status: He is oriented to person, place, and time.     Cranial Nerves: No cranial nerve deficit.     Motor: No weakness.     Comments: Extremely hard of hearing     Diagnostic Tests: NUCLEAR MEDICINE PET SKULL BASE TO THIGH   TECHNIQUE: 8 mCi F-18 FDG was injected intravenously. Full-ring PET imaging was performed from the skull base to thigh after the radiotracer. CT data was obtained and used for attenuation correction and anatomic localization.   Fasting blood glucose: 102 mg/dl   COMPARISON:  None.   FINDINGS: Mediastinal blood pool activity: SUV max 2.37   Liver activity: SUV max NA   NECK: No hypermetabolic lymph nodes in the neck. Hypermetabolic nodule in the RIGHT lobe of thyroid gland with SUV max equal 5.8. Nodule measures 16 mm (image 76/3).   Incidental CT findings: none   CHEST: Within the RIGHT upper lobe, spiculated nodule measuring 14 mm (image 81) and has intense metabolic activity with SUV max equal 6.6.   No additional enlarged or hypermetabolic lymph nodes in the lungs.   No hypermetabolic mediastinal lymph nodes or supraclavicular lymph nodes.   Incidental CT findings: none   ABDOMEN/PELVIS: No abnormal hypermetabolic activity within the liver, pancreas, adrenal glands, or spleen. No hypermetabolic lymph nodes in the abdomen or pelvis.   Incidental CT findings: Enlargement of the RIGHT adrenal gland has low attenuation consistent with benign adenoma.   No abnormal activity in liver.  No abdominopelvic adenopathy.   There is a focus intense metabolic activity within the RIGHT lobe  of the prostate gland with SUV max equal 7.1 (image 258).   SKELETON: No focal hypermetabolic activity to suggest skeletal metastasis.   Incidental CT findings: none   IMPRESSION: 1. 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 2. Hypermetabolic nodule in the RIGHT lobe of thyroid gland is indeterminate. Recommend ultrasound-guided biopsy. 3. Hypermetabolic nodule in the RIGHT lobe of the prostate gland. Recommend correlation with PSA and if elevated recommend prostate MRI.   These results will be called to the ordering clinician or representative by the Radiologist Assistant, and communication documented in the PACS or [employer].     Electronically Signed   By: Suzy Bouchard M.D.   On: 05/26/2021 15:47   I personally reviewed the PET/CT images.  There is a 1.5 cm hypermetabolic nodule in the right upper lobe with no regional or distant metastatic disease.  Also hypermetabolic areas in right thyroid and prostate.  Pulmonary function testing 06/22/2021 FVC 2.46 (59%) FEV1 1.85 (62%) FEV1 2.26 (76%) postbronchodilator DLCO 17.11 (69%)   Impression: Alan Melendez is a 81 year old man with a history of heavy tobacco abuse, COPD, CAD, MI, coronary stents, colon cancer, glaucoma, and epilepsy.  He had about 100-pack-year history of smoking prior to quitting 3 years ago when he had heart attack.  He was first found to have a lung nodule in February 2021.  On recent CT the nodule had increased in size.  He was referred to Dr. Melvyn Novas.  A PET/CT showed the nodule was markedly hypermetabolic.  There was no evidence of regional or distant metastatic disease.  Findings are consistent  with a new stage Ia primary bronchogenic carcinoma.  Metastatic colon cancer, infectious, and inflammatory nodules are also in the differential but are far less likely.  We discussed treatment options including surgical resection  versus stereotactic radiation.  We discussed the advantages and disadvantages of each.  He very strongly prefer surgery and does not want to consider radiation.  We discussed the proposed surgical procedure which would be a robotic right VATS for wedge resection and possible right upper lobectomy.  That would depend on the results of the intraoperative frozen section.  I informed him and his daughter of the general nature of the procedure including the need for general anesthesia, the incisions to be used, the use of a surgical robot, the use of carbon dioxide insufflation, use of a drainage tube postoperatively, the expected hospital stay, and the overall recovery.  I also informed him of the indications, risks, benefits, and alternatives.  They understand the risks include, but are not limited to death, MI, DVT, PE, bleeding, possible need for transfusion, infection, prolonged air leak, cardiac arrhythmias, as well as possibility of other unforeseeable complications.  He does have adequate pulmonary reserve to tolerate resection.  He does have a history of coronary disease with an MI and stents previously.  He is a very poor historian but as far as I can tell he is not having angina currently.  We will check with Dr. Curlene Labrum his cardiologist to see if he needs any additional testing.  Thyroid and prostate nodules-will need to be evaluated.   Plan: Needs cardiology clearance from Dr. Curlene Labrum in [place] Robotic right VATS for wedge resection and possible right upper lobectomy once cleared by cardiology. Ultimately will need urology and general surgery consults regarding prostate and thyroid findings  [name], MD [facility] [phone]

## 2021-06-28 ENCOUNTER — Other Ambulatory Visit: Payer: Self-pay | Admitting: *Deleted

## 2021-07-01 ENCOUNTER — Encounter: Payer: Self-pay | Admitting: Internal Medicine

## 2021-07-01 DIAGNOSIS — J449 Chronic obstructive pulmonary disease, unspecified: Secondary | ICD-10-CM | POA: Insufficient documentation

## 2021-07-03 DIAGNOSIS — I25118 Atherosclerotic heart disease of native coronary artery with other forms of angina pectoris: Secondary | ICD-10-CM | POA: Diagnosis not present

## 2021-07-03 DIAGNOSIS — E785 Hyperlipidemia, unspecified: Secondary | ICD-10-CM | POA: Diagnosis not present

## 2021-07-11 DIAGNOSIS — Z01810 Encounter for preprocedural cardiovascular examination: Secondary | ICD-10-CM | POA: Diagnosis not present

## 2021-07-11 DIAGNOSIS — I25118 Atherosclerotic heart disease of native coronary artery with other forms of angina pectoris: Secondary | ICD-10-CM | POA: Diagnosis not present

## 2021-07-11 DIAGNOSIS — Z01818 Encounter for other preprocedural examination: Secondary | ICD-10-CM | POA: Diagnosis not present

## 2021-07-17 ENCOUNTER — Other Ambulatory Visit: Payer: Self-pay | Admitting: *Deleted

## 2021-07-17 ENCOUNTER — Encounter: Payer: Self-pay | Admitting: *Deleted

## 2021-07-17 DIAGNOSIS — Z5181 Encounter for therapeutic drug level monitoring: Secondary | ICD-10-CM

## 2021-07-17 DIAGNOSIS — R911 Solitary pulmonary nodule: Secondary | ICD-10-CM

## 2021-07-24 NOTE — Progress Notes (Signed)
Surgical Instructions    Your procedure is scheduled on July 27 2021.  Report to [facility] Main Entrance "A" at 9:41 A.M., then check in with the Admitting office.  Call this number if you have problems the morning of surgery:  [phone]   If you have any questions prior to your surgery date call [phone]: Open Monday-Friday 8am-4pm    Remember:  Do not eat or drink anything after midnight the night before your surgery      Take these medicines the morning of surgery with A SIP OF WATER:  dorzolamide-timolol (COSOPT) hydrOXYzine (ATARAX) if needed nitroGLYCERIN (NITROSTAT)    As of today, STOP taking any Aspirin (unless otherwise instructed by your surgeon) Aleve, Naproxen, Ibuprofen, Motrin, Advil, Goody's, BC's, all herbal medications, fish oil, and all vitamins.           Do not wear jewelry or makeup Do not wear lotions, powders, perfumes/colognes, or deodorant. Do not shave 48 hours prior to surgery.  Men may shave face and neck. Do not bring valuables to the hospital. Do not wear nail polish, gel polish, artificial nails, or any other type of covering on natural nails (fingers and toes) If you have artificial nails or gel coating that need to be removed by a nail salon, please have this removed prior to surgery. Artificial nails or gel coating may interfere with anesthesia's ability to adequately monitor your vital signs.  [facility] is not responsible for any belongings or valuables. .   Do NOT Smoke (Tobacco/Vaping)  24 hours prior to your procedure  If you use a CPAP at night, you may bring your mask for your overnight stay.   Contacts, glasses, hearing aids, dentures or partials may not be worn into surgery, please bring cases for these belongings   For patients admitted to the hospital, discharge time will be determined by your treatment team.   Patients discharged the day of surgery will not be allowed to drive home, and someone needs to stay with  them for 24 hours.  NO VISITORS WILL BE ALLOWED IN PRE-OP WHERE PATIENTS ARE PREPPED FOR SURGERY.  ONLY 1 SUPPORT PERSON MAY BE PRESENT IN THE WAITING ROOM WHILE YOU ARE IN SURGERY.  IF YOU ARE TO BE ADMITTED, ONCE YOU ARE IN YOUR ROOM YOU WILL BE ALLOWED TWO (2) VISITORS. 1 (ONE) VISITOR MAY STAY OVERNIGHT BUT MUST ARRIVE TO THE ROOM BY 8pm.  Minor children may have two parents present. Special consideration for safety and communication needs will be reviewed on a case by case basis.  Special instructions:    Oral Hygiene is also important to reduce your risk of infection.  Remember - BRUSH YOUR TEETH THE MORNING OF SURGERY WITH YOUR REGULAR TOOTHPASTE   [place]- Preparing For Surgery  Before surgery, you can play an important role. Because skin is not sterile, your skin needs to be as free of germs as possible. You can reduce the number of germs on your skin by washing with CHG (chlorahexidine gluconate) Soap before surgery.  CHG is an antiseptic cleaner which kills germs and bonds with the skin to continue killing germs even after washing.     Please do not use if you have an allergy to CHG or antibacterial soaps. If your skin becomes reddened/irritated stop using the CHG.  Do not shave (including legs and underarms) for at least 48 hours prior to first CHG shower. It is OK to shave your face.  Please follow these instructions carefully.  Shower the NIGHT BEFORE SURGERY and the MORNING OF SURGERY with CHG Soap.   If you chose to wash your hair, wash your hair first as usual with your normal shampoo. After you shampoo, rinse your hair and body thoroughly to remove the shampoo.  Then [employer] and genitals (private parts) with your normal soap and rinse thoroughly to remove soap.  After that Use CHG Soap as you would any other liquid soap. You can apply CHG directly to the skin and wash gently with a scrungie or a clean washcloth.   Apply the CHG Soap to your body ONLY FROM THE NECK  DOWN.  Do not use on open wounds or open sores. Avoid contact with your eyes, ears, mouth and genitals (private parts). Wash Face and genitals (private parts)  with your normal soap.   Wash thoroughly, paying special attention to the area where your surgery will be performed.  Thoroughly rinse your body with warm water from the neck down.  DO NOT shower/wash with your normal soap after using and rinsing off the CHG Soap.  Pat yourself dry with a CLEAN TOWEL.  Wear CLEAN PAJAMAS to bed the night before surgery  Place CLEAN SHEETS on your bed the night before your surgery  DO NOT SLEEP WITH PETS.   Day of Surgery:  Take a shower with CHG soap. Wear Clean/Comfortable clothing the morning of surgery Do not apply any deodorants/lotions.   Remember to brush your teeth WITH YOUR REGULAR TOOTHPASTE.    COVID testing  If you are going to stay overnight or be admitted after your procedure/surgery and require a pre-op COVID test, please follow these instructions after your COVID test   You are not required to quarantine however you are required to wear a well-fitting mask when you are out and around people not in your household.  If your mask becomes wet or soiled, replace with a new one.  Wash your hands often with soap and water for 20 seconds or clean your hands with an alcohol-based hand sanitizer that contains at least 60% alcohol.  Do not share personal items.  Notify your provider: if you are in close contact with someone who has COVID  or if you develop a fever of 100.4 or greater, sneezing, cough, sore throat, shortness of breath or body aches.    Please read over the following fact sheets that you were given.

## 2021-07-25 ENCOUNTER — Ambulatory Visit (HOSPITAL_COMMUNITY)
Admission: RE | Admit: 2021-07-25 | Discharge: 2021-07-25 | Disposition: A | Discharge status: Home / Self Care | Payer: Medicare Other | Source: Ambulatory Visit | Attending: Thoracic Surgery (Cardiothoracic Vascular Surgery) | Admitting: Thoracic Surgery (Cardiothoracic Vascular Surgery)

## 2021-07-25 ENCOUNTER — Other Ambulatory Visit: Payer: Self-pay

## 2021-07-25 ENCOUNTER — Encounter (HOSPITAL_COMMUNITY): Payer: Self-pay

## 2021-07-25 ENCOUNTER — Encounter (HOSPITAL_COMMUNITY)
Admission: RE | Admit: 2021-07-25 | Discharge: 2021-07-25 | Disposition: A | Discharge status: Home / Self Care | Payer: Medicare Other | Source: Ambulatory Visit | Attending: Thoracic Surgery (Cardiothoracic Vascular Surgery) | Admitting: Thoracic Surgery (Cardiothoracic Vascular Surgery)

## 2021-07-25 VITALS — BP 129/56 | HR 64 | Temp 98.3°F | Resp 17 | Ht 72.0 in | Wt 243.5 lb

## 2021-07-25 DIAGNOSIS — Z87891 Personal history of nicotine dependence: Secondary | ICD-10-CM | POA: Diagnosis not present

## 2021-07-25 DIAGNOSIS — D62 Acute posthemorrhagic anemia: Secondary | ICD-10-CM | POA: Diagnosis not present

## 2021-07-25 DIAGNOSIS — Z01818 Encounter for other preprocedural examination: Secondary | ICD-10-CM

## 2021-07-25 DIAGNOSIS — R911 Solitary pulmonary nodule: Secondary | ICD-10-CM

## 2021-07-25 DIAGNOSIS — Z85038 Personal history of other malignant neoplasm of large intestine: Secondary | ICD-10-CM | POA: Diagnosis not present

## 2021-07-25 DIAGNOSIS — J449 Chronic obstructive pulmonary disease, unspecified: Secondary | ICD-10-CM | POA: Diagnosis not present

## 2021-07-25 DIAGNOSIS — G40909 Epilepsy, unspecified, not intractable, without status epilepticus: Secondary | ICD-10-CM | POA: Diagnosis not present

## 2021-07-25 DIAGNOSIS — F418 Other specified anxiety disorders: Secondary | ICD-10-CM | POA: Diagnosis not present

## 2021-07-25 DIAGNOSIS — J9811 Atelectasis: Secondary | ICD-10-CM | POA: Diagnosis not present

## 2021-07-25 DIAGNOSIS — Z6833 Body mass index (BMI) 33.0-33.9, adult: Secondary | ICD-10-CM | POA: Diagnosis not present

## 2021-07-25 DIAGNOSIS — I252 Old myocardial infarction: Secondary | ICD-10-CM | POA: Diagnosis not present

## 2021-07-25 DIAGNOSIS — Z7982 Long term (current) use of aspirin: Secondary | ICD-10-CM | POA: Diagnosis not present

## 2021-07-25 DIAGNOSIS — H409 Unspecified glaucoma: Secondary | ICD-10-CM | POA: Diagnosis not present

## 2021-07-25 DIAGNOSIS — R079 Chest pain, unspecified: Secondary | ICD-10-CM | POA: Diagnosis not present

## 2021-07-25 DIAGNOSIS — C3411 Malignant neoplasm of upper lobe, right bronchus or lung: Secondary | ICD-10-CM | POA: Diagnosis not present

## 2021-07-25 DIAGNOSIS — I517 Cardiomegaly: Secondary | ICD-10-CM | POA: Diagnosis not present

## 2021-07-25 DIAGNOSIS — Z955 Presence of coronary angioplasty implant and graft: Secondary | ICD-10-CM | POA: Diagnosis not present

## 2021-07-25 DIAGNOSIS — C3401 Malignant neoplasm of right main bronchus: Secondary | ICD-10-CM | POA: Diagnosis not present

## 2021-07-25 DIAGNOSIS — E669 Obesity, unspecified: Secondary | ICD-10-CM | POA: Diagnosis not present

## 2021-07-25 DIAGNOSIS — J90 Pleural effusion, not elsewhere classified: Secondary | ICD-10-CM | POA: Diagnosis not present

## 2021-07-25 DIAGNOSIS — Z9049 Acquired absence of other specified parts of digestive tract: Secondary | ICD-10-CM | POA: Diagnosis not present

## 2021-07-25 DIAGNOSIS — Z20822 Contact with and (suspected) exposure to covid-19: Secondary | ICD-10-CM | POA: Diagnosis not present

## 2021-07-25 DIAGNOSIS — Z5181 Encounter for therapeutic drug level monitoring: Secondary | ICD-10-CM

## 2021-07-25 DIAGNOSIS — I2510 Atherosclerotic heart disease of native coronary artery without angina pectoris: Secondary | ICD-10-CM | POA: Diagnosis not present

## 2021-07-25 DIAGNOSIS — I255 Ischemic cardiomyopathy: Secondary | ICD-10-CM | POA: Diagnosis present

## 2021-07-25 DIAGNOSIS — Z79899 Other long term (current) drug therapy: Secondary | ICD-10-CM | POA: Diagnosis not present

## 2021-07-25 DIAGNOSIS — J939 Pneumothorax, unspecified: Secondary | ICD-10-CM | POA: Diagnosis not present

## 2021-07-25 HISTORY — DX: Personal history of urinary calculi: Z87.442

## 2021-07-25 LAB — COMPREHENSIVE METABOLIC PANEL
ALT: 11 U/L (ref 0–44)
AST: 12 U/L — ABNORMAL LOW (ref 15–41)
Albumin: 3.5 g/dL (ref 3.5–5.0)
Alkaline Phosphatase: 123 U/L (ref 38–126)
Anion gap: 9 (ref 5–15)
BUN: 13 mg/dL (ref 8–23)
CO2: 24 mmol/L (ref 22–32)
Calcium: 9.1 mg/dL (ref 8.9–10.3)
Chloride: 105 mmol/L (ref 98–111)
Creatinine, Ser: 0.79 mg/dL (ref 0.61–1.24)
GFR, Estimated: >60 mL/min (ref >60)
Glucose, Bld: 104 mg/dL — ABNORMAL HIGH (ref 70–99)
Potassium: 3.6 mmol/L (ref 3.5–5.1)
Sodium: 138 mmol/L (ref 135–145)
Total Bilirubin: 0.6 mg/dL (ref 0.3–1.2)
Total Protein: 6.3 g/dL — ABNORMAL LOW (ref 6.5–8.1)

## 2021-07-25 LAB — BLOOD GAS, ARTERIAL
Acid-Base Excess: 4.5 mmol/L — ABNORMAL HIGH (ref 0.0–2.0)
Bicarbonate: 28.4 mmol/L — ABNORMAL HIGH (ref 20.0–28.0)
Drawn by: 60286
FIO2: 21 %
O2 Saturation: 97.9 %
Patient temperature: 37
pCO2 arterial: 39 mmHg (ref 32–48)
pH, Arterial: 7.47 — ABNORMAL HIGH (ref 7.35–7.45)
pO2, Arterial: 100 mmHg (ref 83–108)

## 2021-07-25 LAB — SURGICAL PCR SCREEN
MRSA, PCR: NEGATIVE
Staphylococcus aureus: NEGATIVE

## 2021-07-25 LAB — CBC
HCT: 37.1 % — ABNORMAL LOW (ref 39.0–52.0)
Hemoglobin: 12.3 g/dL — ABNORMAL LOW (ref 13.0–17.0)
MCH: 29.4 pg (ref 26.0–34.0)
MCHC: 33.2 g/dL (ref 30.0–36.0)
MCV: 88.5 fL (ref 80.0–100.0)
Platelets: 215 10*3/uL (ref 150–400)
RBC: 4.19 MIL/uL — ABNORMAL LOW (ref 4.22–5.81)
RDW: 13.4 % (ref 11.5–15.5)
WBC: 6.1 10*3/uL (ref 4.0–10.5)
nRBC: 0 % (ref 0.0–0.2)

## 2021-07-25 LAB — URINALYSIS, ROUTINE W REFLEX MICROSCOPIC
Bilirubin Urine: NEGATIVE
Glucose, UA: NEGATIVE mg/dL
Hgb urine dipstick: NEGATIVE
Ketones, ur: 5 mg/dL — AB
Leukocytes,Ua: NEGATIVE
Nitrite: NEGATIVE
Protein, ur: 30 mg/dL — AB
Specific Gravity, Urine: 1.027 (ref 1.005–1.030)
pH: 6 (ref 5.0–8.0)

## 2021-07-25 LAB — SARS CORONAVIRUS 2 (TAT 6-24 HRS): SARS Coronavirus 2: NEGATIVE

## 2021-07-25 LAB — APTT: aPTT: 39 seconds — ABNORMAL HIGH (ref 24–36)

## 2021-07-25 LAB — PROTIME-INR
INR: 1.1 (ref 0.8–1.2)
Prothrombin Time: 13.9 seconds (ref 11.4–15.2)

## 2021-07-25 NOTE — Progress Notes (Signed)
Notified Rosanna Randy, RN with Dr. Roxan Hockey of patient's abnormal urinalysis. Per Thurmond Butts, she will notify Dr. Roxan Hockey.

## 2021-07-25 NOTE — Progress Notes (Signed)
Surgical Instructions    Your procedure is scheduled on July 27 2021.   Report to [facility] Main Entrance "A" at 08:00 A.M., then check in with the Admitting office.  Call this number if you have problems the morning of surgery:  [phone]   If you have any questions prior to your surgery date call [phone]: Open Monday-Friday 8am-4pm    Remember:  Do not eat or drink anything after midnight the night before your surgery      Take these medicines the morning of surgery with A SIP OF WATER:  dorzolamide-timolol (COSOPT) hydrOXYzine (ATARAX) if needed nitroGLYCERIN (NITROSTAT)    As of today, STOP taking any Aspirin (unless otherwise instructed by your surgeon) Aleve, Naproxen, Ibuprofen, Motrin, Advil, Goody's, BC's, all herbal medications, fish oil, and all vitamins.           Do not wear jewelry or makeup Do not wear lotions, powders, perfumes/colognes, or deodorant. Do not shave 48 hours prior to surgery.  Men may shave face and neck. Do not bring valuables to the hospital. Do not wear nail polish, gel polish, artificial nails, or any other type of covering on natural nails (fingers and toes) If you have artificial nails or gel coating that need to be removed by a nail salon, please have this removed prior to surgery. Artificial nails or gel coating may interfere with anesthesia's ability to adequately monitor your vital signs.  [facility] is not responsible for any belongings or valuables. .   Do NOT Smoke (Tobacco/Vaping)  24 hours prior to your procedure  If you use a CPAP at night, you may bring your mask for your overnight stay.   Contacts, glasses, hearing aids, dentures or partials may not be worn into surgery, please bring cases for these belongings   For patients admitted to the hospital, discharge time will be determined by your treatment team.   Patients discharged the day of surgery will not be allowed to drive home, and someone needs to stay with  them for 24 hours.  NO VISITORS WILL BE ALLOWED IN PRE-OP WHERE PATIENTS ARE PREPPED FOR SURGERY.  ONLY 1 SUPPORT PERSON MAY BE PRESENT IN THE WAITING ROOM WHILE YOU ARE IN SURGERY.  IF YOU ARE TO BE ADMITTED, ONCE YOU ARE IN YOUR ROOM YOU WILL BE ALLOWED TWO (2) VISITORS. 1 (ONE) VISITOR MAY STAY OVERNIGHT BUT MUST ARRIVE TO THE ROOM BY 8pm.  Minor children may have two parents present. Special consideration for safety and communication needs will be reviewed on a case by case basis.  Special instructions:    Oral Hygiene is also important to reduce your risk of infection.  Remember - BRUSH YOUR TEETH THE MORNING OF SURGERY WITH YOUR REGULAR TOOTHPASTE   [place]- Preparing For Surgery  Before surgery, you can play an important role. Because skin is not sterile, your skin needs to be as free of germs as possible. You can reduce the number of germs on your skin by washing with CHG (chlorahexidine gluconate) Soap before surgery.  CHG is an antiseptic cleaner which kills germs and bonds with the skin to continue killing germs even after washing.     Please do not use if you have an allergy to CHG or antibacterial soaps. If your skin becomes reddened/irritated stop using the CHG.  Do not shave (including legs and underarms) for at least 48 hours prior to first CHG shower. It is OK to shave your face.  Please follow these instructions  carefully.     Shower the NIGHT BEFORE SURGERY and the MORNING OF SURGERY with CHG Soap.   If you chose to wash your hair, wash your hair first as usual with your normal shampoo. After you shampoo, rinse your hair and body thoroughly to remove the shampoo.  Then [employer] and genitals (private parts) with your normal soap and rinse thoroughly to remove soap.  After that Use CHG Soap as you would any other liquid soap. You can apply CHG directly to the skin and wash gently with a scrungie or a clean washcloth.   Apply the CHG Soap to your body ONLY FROM THE NECK  DOWN.  Do not use on open wounds or open sores. Avoid contact with your eyes, ears, mouth and genitals (private parts). Wash Face and genitals (private parts)  with your normal soap.   Wash thoroughly, paying special attention to the area where your surgery will be performed.  Thoroughly rinse your body with warm water from the neck down.  DO NOT shower/wash with your normal soap after using and rinsing off the CHG Soap.  Pat yourself dry with a CLEAN TOWEL.  Wear CLEAN PAJAMAS to bed the night before surgery  Place CLEAN SHEETS on your bed the night before your surgery  DO NOT SLEEP WITH PETS.   Day of Surgery:  Take a shower with CHG soap. Wear Clean/Comfortable clothing the morning of surgery Do not apply any deodorants/lotions.   Remember to brush your teeth WITH YOUR REGULAR TOOTHPASTE.    COVID testing  If you are going to stay overnight or be admitted after your procedure/surgery and require a pre-op COVID test, please follow these instructions after your COVID test   You are not required to quarantine however you are required to wear a well-fitting mask when you are out and around people not in your household.  If your mask becomes wet or soiled, replace with a new one.  Wash your hands often with soap and water for 20 seconds or clean your hands with an alcohol-based hand sanitizer that contains at least 60% alcohol.  Do not share personal items.  Notify your provider: if you are in close contact with someone who has COVID  or if you develop a fever of 100.4 or greater, sneezing, cough, sore throat, shortness of breath or body aches.    Please read over the following fact sheets that you were given.

## 2021-07-25 NOTE — Progress Notes (Signed)
PCP - Dr. Gar Ponto Cardiologist - Dr. Candis Musa  PPM/ICD - n/a Device Orders - n/a Rep Notified - n/a  Chest x-ray - 07/25/21 EKG - 07/25/21 Stress Test - 07/11/21- Care Everywhere ECHO - 09/2019 Cardiac Cath - 05/06/19  Sleep Study - denies CPAP - n/a  Fasting Blood Sugar - n/a Checks Blood Sugar _____ times a day- n/a  Blood Thinner Instructions: n/a Aspirin Instructions: As of today stop taking Aspirin unless otherwise instructed by your surgeon.   ERAS Protcol - No. NPO  COVID TEST- 07/25/21. Pending.    Anesthesia review: Yes. Cardiac History. EKG Review  Patient denies shortness of breath, fever, cough and chest pain at PAT appointment   All instructions explained to the patient, with a verbal understanding of the material. Patient agrees to go over the instructions while at home for a better understanding. Patient also instructed to self quarantine after being tested for COVID-19. The opportunity to ask questions was provided.

## 2021-07-26 NOTE — Progress Notes (Signed)
Anesthesia Chart Review:  Case: [id number] Date/Time: 07/27/21 0945   Procedure: XI ROBOTIC ASSISTED THORASCOPY-WEDGE RESECTION, possible lobectomy (Right: Chest)   Anesthesia type: General   Pre-op diagnosis: RUL NODULE   Location: [facility] OR ROOM 10 / [place] [facility]   Surgeons: [name], MD       DISCUSSION: Patient is a 81 year old male scheduled for the above procedure.   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 BMI is consistent with obesity.  Patient had a preoperative cardiology evaluation by Assar, Jodelle Gross, DO on 07/03/21 and underwent a stress test on 07/11/21. Regarding perioperative risk assessment, Dr. Candis Musa wrote, "Stress test is abnormal with intermediate risk. No LAD territory ischemia (prior stents) and mild non-obstructive disease of RCA on cath 2020. Patient has not been able to tolerate BB therapy and was previously non-compliant with DaPT. In light of this and the fact that he wants to proceed with surgery to get cancer out, we will hold off on heart cath. His surgical risk is at least moderate and his procedure should be done at a facility with IC/CTS backup."  Presurgical COVID-19 test negative on 07/25/2021.  Anesthesia team to evaluate on the day of surgery. Reviewed cardiology input with anesthesiologist Hoy Morn, MD.   VS: BP (!) 129/56    Pulse 64    Temp 36.8 C (Oral)    Resp 17    Ht 6' (1.829 m)    Wt 110.5 kg    SpO2 97%    BMI 33.02 kg/m    PROVIDERS: [name], MD is PCP  Assar, Jodelle Gross, DO is cardiologist Christinia Gully, MD is pulmonologist Montez Morita, Quillian Quince, MD is GI. He colonoscopy on 03/21/21  that showed tubular adenoma, sessile serrated polyp, and no high grade dysplasia or malignancy on pathology.    LABS: Labs reviewed: Acceptable for surgery. (all labs ordered are listed, but only abnormal results are displayed)  Labs Reviewed  CBC - Abnormal; Notable for the following components:      Result Value   RBC 4.19 (*)    Hemoglobin 12.3 (*)    HCT 37.1 (*)    All other components within normal limits  COMPREHENSIVE METABOLIC PANEL - Abnormal; Notable for the following components:   Glucose, Bld 104 (*)    Total Protein 6.3 (*)    AST 12 (*)    All other components within normal limits  BLOOD GAS, ARTERIAL - Abnormal; Notable for the following components:   pH, Arterial 7.47 (*)    Bicarbonate 28.4 (*)    Acid-Base Excess 4.5 (*)    All other components within normal limits  APTT - Abnormal; Notable for the following components:   aPTT 39 (*)    All other components within normal limits  URINALYSIS, ROUTINE W REFLEX MICROSCOPIC - Abnormal; Notable for the following components:   APPearance HAZY (*)    Ketones, ur 5 (*)    Protein, ur 30 (*)    Bacteria, UA RARE (*)    All other components within normal limits  SURGICAL PCR SCREEN  SARS CORONAVIRUS 2 (TAT 6-24 HRS)  PROTIME-INR  TYPE AND SCREEN    PFTs 06/22/21:  FVC 2.46 (  59%) FEV1 1.85 (62%) FEV1 2.26 (76%) postbronchodilator DLCO 17.11 (69%)    IMAGES: CXR 07/25/21: FINDINGS: The heart size and mediastinal contours are within normal limits. There is mild atherosclerotic calcification of the aorta. No consolidation, effusion, or pneumothorax. There is a vague spiculated nodule in the right upper lobe measuring 1.1 cm. IMPRESSION: 1. No active cardiopulmonary disease. 2. Vague nodule in the right upper lobe measuring 1.1 cm.  PET Scan 05/25/21: IMPRESSION: 1. 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 2. Hypermetabolic  nodule in the RIGHT lobe of thyroid gland is indeterminate. Recommend ultrasound-guided biopsy. 3. Hypermetabolic nodule in the RIGHT lobe of the prostate gland. Recommend correlation with PSA and if elevated recommend prostate MRI.  CT Neck soft tissue 04/27/21 [facility]): Impression: 1. No pathologic lymph nodes in the neck. 9 mm left level 4 lymph  node behind the head of the clavicle mild likely palpable and not  pathologically enlarged.  2. Negative for pharyngeal mass.  3. 16 mm spiculated nodule right upper lobe highly suspicious for  carcinoma of the lung.  4. Unsuccessful attempts were made to reach the referring physician  by telephone.    EKG: 07/25/21: Sinus rhythm with occasional Premature ventricular complexes Left axis deviation Abnormal ECG No significant change since last tracing 17-Mar-2021 09:26, PREVIOUS ECG IS PRESENT Confirmed by Quay Burow [phone]) on 07/25/2021 5:12:33 PM   CV: Nuclear stress test 07/11/21 [facility]): Impression: 1. Moderate size region of moderate reversible ischemia in the  apical and mid segments of the inferolateral wall and inferior wall.  2. Normal left ventricular wall motion.  3. Left ventricular ejection fraction 67%  4. Non invasive risk stratification*: Intermediate     Limited Echo 09/25/19 [facility]): Summary    1. Limited study to assess ventricular function.    2. The left ventricle is upper normal in size with normal wall thickness.    3. The left ventricular systolic function is normal, LVEF is visually  estimated at > 55%.    4. The right ventricle is normal in size, with normal systolic function.     Echo 09/16/19 [facility]): Summary    1. The left ventricle is normal in size with normal wall thickness.    2. The left ventricular systolic function is normal, LVEF is visually  estimated at > 55%.    3. The aortic valve is trileaflet with mildly thickened leaflets with normal  excursion.    4. The right ventricle is  normal in size, with normal systolic function.    5. Technically difficult study due to chest wall/lung interference.    Cardiac cath/PCI 05/11/19 [facility]): Findings:  1. There is Significant 2-vessel coronary artery disease, including 95%  mid LAD stenosis at takeoff of D2 (culprit lesion) and complete occlusion  (likely chronic) of branch of OM1 with right to left collaterals. There is  non obstructive coronary disease 30-40% in the proximal RCA.  2. Upper normal left ventricular filling pressures (LVEDP = 15 mm Hg).  3. Mild-moderately reduced left ventricular systolic function with  anterior hypokinesis (estimated LVEF = 40-45%).  4. Successful PCI to the mid LAD with placement of a Synergy (3.0 x 24 mm)  DES excellent angiographic result and TIMI 3 flow.  5. Successful PCI to the ostial diagonal with placement of a Onyx (2.5 x  12 mm) DES excellent angiographic result and TIMI 3 flow.  6. Successful PCI to the  proximal LAD with placement of a Synergy (3.5x24  mm) DES excellent angiographic result and TIMI 3 flow.   Recommendations:  1. Dual antiplatelet therapy with aspirin and Clopidogrel for at least 12  months, ideally longer.  2. Cardiac rehab referral prior to discharge    Past Medical History:  Diagnosis Date   Anxiety    Cataracts, bilateral    Colon cancer ([place])    Depression    Epilepsy ([place])    Glaucoma    Heart attack ([place])    History of kidney stones     Past Surgical History:  Procedure Laterality Date   APPENDECTOMY     CHOLECYSTECTOMY     COLONOSCOPY WITH PROPOFOL N/A 03/21/2021   Procedure: COLONOSCOPY WITH PROPOFOL;  Surgeon: Harvel Quale, MD;  Location: [facility];  Service: Gastroenterology;  Laterality: N/A;  1:45, pt knows to arrive at 9:30   coloonscopy     [place]  03/21/2021   Procedure: [place];  Surgeon: Montez Morita, Quillian Quince, MD;  Location: [facility] END[facility] SUITE;   Service: Gastroenterology;;   POLYPECTOMY  03/21/2021   Procedure: POLYPECTOMY;  Surgeon: Montez Morita, Quillian Quince, MD;  Location: [facility];  Service: Gastroenterology;;   SUBMUCOSAL TATTOO INJECTION  03/21/2021   Procedure: SUBMUCOSAL TATTOO INJECTION;  Surgeon: Montez Morita, Quillian Quince, MD;  Location: [facility];  Service: Gastroenterology;;    MEDICATIONS:  aspirin 325 MG EC tablet   cholecalciferol (VITAMIN D3) 25 MCG (1000 UNIT) tablet   dorzolamide-timolol (COSOPT) 22.3-6.8 MG/ML ophthalmic solution   furosemide (LASIX) 40 MG tablet   hydrOXYzine (ATARAX) 25 MG tablet   latanoprost (XALATAN) 0.005 % ophthalmic solution   Multiple Vitamins-Minerals (EYE HEALTH + LUTEIN PO)   nitroGLYCERIN (NITROSTAT) 0.4 MG SL tablet   PHENobarbital (LUMINAL) 32.4 MG tablet   phenytoin (DILANTIN) 100 MG ER capsule   No current facility-administered medications for this encounter.    Myra Gianotti, PA-C Surgical Short Stay/Anesthesiology [facility] Phone [phone] [facility] Phone [phone] 07/26/2021 11:50 AM

## 2021-07-26 NOTE — Anesthesia Preprocedure Evaluation (Addendum)
Anesthesia Evaluation  Patient identified by MRN, date of birth, ID band Patient awake    Reviewed: Allergy & Precautions, NPO status , Patient's Chart, lab work & pertinent test results  Airway Mallampati: II  TM Distance: >3 FB Neck ROM: Full    Dental  (+) Edentulous Upper, Dental Advisory Given   Pulmonary COPD, former smoker,    Pulmonary exam normal breath sounds clear to auscultation       Cardiovascular + CAD, + Past MI, + Cardiac Stents and + DOE   Rhythm:Regular Rate:Normal     Neuro/Psych Seizures -,  PSYCHIATRIC DISORDERS Anxiety Depression    GI/Hepatic negative GI ROS, Neg liver ROS,   Endo/Other  negative endocrine ROS  Renal/GU negative Renal ROS     Musculoskeletal negative musculoskeletal ROS (+)   Abdominal (+) + obese,   Peds  Hematology negative hematology ROS (+)   Anesthesia Other Findings   Reproductive/Obstetrics                                                           Anesthesia Evaluation  Patient identified by MRN, date of birth, ID band Patient awake    Reviewed: Allergy & Precautions, NPO status , Patient's Chart, lab work & pertinent test results  Airway Mallampati: II  TM Distance: >3 FB Neck ROM: Full    Dental  (+) Dental Advisory Given, Upper Dentures, Partial Lower   Pulmonary former smoker,    Pulmonary exam normal breath sounds clear to auscultation       Cardiovascular Exercise Tolerance: Good + Past MI and + Cardiac Stents  Normal cardiovascular exam Rhythm:Regular Rate:Normal     Neuro/Psych Seizures -, Well Controlled,  PSYCHIATRIC DISORDERS Anxiety Depression    GI/Hepatic negative GI ROS, Neg liver ROS,   Endo/Other  negative endocrine ROS  Renal/GU negative Renal ROS     Musculoskeletal negative musculoskeletal ROS (+)   Abdominal   Peds  Hematology negative hematology ROS (+)   Anesthesia Other  Findings   Reproductive/Obstetrics negative OB ROS                            Anesthesia Physical Anesthesia Plan  ASA: 3  Anesthesia Plan: General   Post-op Pain Management:    Induction: Intravenous  PONV Risk Score and Plan: TIVA  Airway Management Planned: Nasal Cannula and Natural Airway  Additional Equipment:   Intra-op Plan:   Post-operative Plan:   Informed Consent: I have reviewed the patients History and Physical, chart, labs and discussed the procedure including the risks, benefits and alternatives for the proposed anesthesia with the patient or authorized representative who has indicated his/her understanding and acceptance.     Dental advisory given  Plan Discussed with: CRNA and Surgeon  Anesthesia Plan Comments:         Anesthesia Quick Evaluation  Anesthesia Physical Anesthesia Plan  ASA: 3  Anesthesia Plan: General   Post-op Pain Management: Precedex and Ofirmev IV (intra-op)*   Induction: Intravenous  PONV Risk Score and Plan: 3 and Treatment may vary due to age or medical condition, Ondansetron and Dexamethasone  Airway Management Planned: Double Lumen EBT  Additional Equipment: Arterial line and CVP  Intra-op Plan:   Post-operative Plan: Extubation in OR  Informed  Consent: I have reviewed the patients History and Physical, chart, labs and discussed the procedure including the risks, benefits and alternatives for the proposed anesthesia with the patient or authorized representative who has indicated his/her understanding and acceptance.     Dental advisory given  Plan Discussed with: CRNA  Anesthesia Plan Comments: (PAT note written 07/26/2021 by Myra Gianotti, PA-C.  Preoperative cardiology evaluation by Assar, Jodelle Gross, DO following 07/11/21 stress test, "Stress test is abnormal with intermediate risk. No LAD territory ischemia (prior stents) and mild non-obstructive disease of RCA on cath 2020.  Patient has not been able to tolerate BB therapy and was previously non-compliant with DaPT. In light of this and the fact that he wants to proceed with surgery to get cancer out, we will hold off on heart cath. His surgical risk is at least moderate and his procedure should be done at a facility with IC/CTS backup."  )      Anesthesia Quick Evaluation

## 2021-07-27 ENCOUNTER — Inpatient Hospital Stay (HOSPITAL_COMMUNITY): Payer: Medicare Other | Admitting: Certified Registered Nurse Anesthetist

## 2021-07-27 ENCOUNTER — Other Ambulatory Visit: Payer: Self-pay

## 2021-07-27 ENCOUNTER — Encounter (HOSPITAL_COMMUNITY)
Admission: RE | Disposition: A | Discharge status: Home / Self Care | Payer: Self-pay | Source: Home / Self Care | Attending: Thoracic Surgery (Cardiothoracic Vascular Surgery)

## 2021-07-27 ENCOUNTER — Inpatient Hospital Stay (HOSPITAL_COMMUNITY): Payer: Medicare Other

## 2021-07-27 ENCOUNTER — Inpatient Hospital Stay (HOSPITAL_COMMUNITY)
Admission: RE | Admit: 2021-07-27 | Discharge: 2021-07-30 | DRG: 164 | Disposition: A | Discharge status: Home / Self Care | Payer: Medicare Other | Attending: Thoracic Surgery (Cardiothoracic Vascular Surgery) | Admitting: Thoracic Surgery (Cardiothoracic Vascular Surgery)

## 2021-07-27 ENCOUNTER — Encounter (HOSPITAL_COMMUNITY): Payer: Self-pay | Admitting: Thoracic Surgery (Cardiothoracic Vascular Surgery)

## 2021-07-27 DIAGNOSIS — I2510 Atherosclerotic heart disease of native coronary artery without angina pectoris: Secondary | ICD-10-CM | POA: Diagnosis present

## 2021-07-27 DIAGNOSIS — F418 Other specified anxiety disorders: Secondary | ICD-10-CM | POA: Diagnosis not present

## 2021-07-27 DIAGNOSIS — J449 Chronic obstructive pulmonary disease, unspecified: Secondary | ICD-10-CM | POA: Diagnosis present

## 2021-07-27 DIAGNOSIS — I255 Ischemic cardiomyopathy: Secondary | ICD-10-CM | POA: Diagnosis present

## 2021-07-27 DIAGNOSIS — C3411 Malignant neoplasm of upper lobe, right bronchus or lung: Secondary | ICD-10-CM | POA: Diagnosis not present

## 2021-07-27 DIAGNOSIS — Z9049 Acquired absence of other specified parts of digestive tract: Secondary | ICD-10-CM | POA: Diagnosis not present

## 2021-07-27 DIAGNOSIS — R079 Chest pain, unspecified: Secondary | ICD-10-CM | POA: Diagnosis not present

## 2021-07-27 DIAGNOSIS — Z20822 Contact with and (suspected) exposure to covid-19: Secondary | ICD-10-CM | POA: Diagnosis present

## 2021-07-27 DIAGNOSIS — I252 Old myocardial infarction: Secondary | ICD-10-CM

## 2021-07-27 DIAGNOSIS — Z87891 Personal history of nicotine dependence: Secondary | ICD-10-CM

## 2021-07-27 DIAGNOSIS — E669 Obesity, unspecified: Secondary | ICD-10-CM | POA: Diagnosis present

## 2021-07-27 DIAGNOSIS — D62 Acute posthemorrhagic anemia: Secondary | ICD-10-CM | POA: Diagnosis not present

## 2021-07-27 DIAGNOSIS — Z6833 Body mass index (BMI) 33.0-33.9, adult: Secondary | ICD-10-CM

## 2021-07-27 DIAGNOSIS — Z85038 Personal history of other malignant neoplasm of large intestine: Secondary | ICD-10-CM | POA: Diagnosis not present

## 2021-07-27 DIAGNOSIS — Z955 Presence of coronary angioplasty implant and graft: Secondary | ICD-10-CM | POA: Diagnosis not present

## 2021-07-27 DIAGNOSIS — R911 Solitary pulmonary nodule: Secondary | ICD-10-CM | POA: Diagnosis not present

## 2021-07-27 DIAGNOSIS — G40909 Epilepsy, unspecified, not intractable, without status epilepticus: Secondary | ICD-10-CM | POA: Diagnosis present

## 2021-07-27 DIAGNOSIS — J939 Pneumothorax, unspecified: Secondary | ICD-10-CM | POA: Diagnosis not present

## 2021-07-27 DIAGNOSIS — J9811 Atelectasis: Secondary | ICD-10-CM | POA: Diagnosis not present

## 2021-07-27 DIAGNOSIS — Z7982 Long term (current) use of aspirin: Secondary | ICD-10-CM | POA: Diagnosis not present

## 2021-07-27 DIAGNOSIS — I517 Cardiomegaly: Secondary | ICD-10-CM | POA: Diagnosis not present

## 2021-07-27 DIAGNOSIS — Z79899 Other long term (current) drug therapy: Secondary | ICD-10-CM | POA: Diagnosis not present

## 2021-07-27 DIAGNOSIS — C3401 Malignant neoplasm of right main bronchus: Secondary | ICD-10-CM | POA: Diagnosis not present

## 2021-07-27 DIAGNOSIS — Z4682 Encounter for fitting and adjustment of non-vascular catheter: Secondary | ICD-10-CM

## 2021-07-27 DIAGNOSIS — J90 Pleural effusion, not elsewhere classified: Secondary | ICD-10-CM | POA: Diagnosis not present

## 2021-07-27 DIAGNOSIS — H409 Unspecified glaucoma: Secondary | ICD-10-CM | POA: Diagnosis present

## 2021-07-27 DIAGNOSIS — Z902 Acquired absence of lung [part of]: Secondary | ICD-10-CM

## 2021-07-27 HISTORY — PX: INTERCOSTAL NERVE BLOCK: SHX5021

## 2021-07-27 HISTORY — PX: LYMPH NODE DISSECTION: SHX5087

## 2021-07-27 LAB — POCT I-STAT 7, (LYTES, BLD GAS, ICA,H+H)
Acid-Base Excess: 1 mmol/L (ref 0.0–2.0)
Bicarbonate: 28 mmol/L (ref 20.0–28.0)
Calcium, Ion: 1.27 mmol/L (ref 1.15–1.40)
HCT: 34 % — ABNORMAL LOW (ref 39.0–52.0)
Hemoglobin: 11.6 g/dL — ABNORMAL LOW (ref 13.0–17.0)
O2 Saturation: 100 %
Patient temperature: 36.1
Potassium: 3.9 mmol/L (ref 3.5–5.1)
Sodium: 139 mmol/L (ref 135–145)
TCO2: 30 mmol/L (ref 22–32)
pCO2 arterial: 53.2 mmHg — ABNORMAL HIGH (ref 32–48)
pH, Arterial: 7.325 — ABNORMAL LOW (ref 7.35–7.45)
pO2, Arterial: 397 mmHg — ABNORMAL HIGH (ref 83–108)

## 2021-07-27 LAB — GLUCOSE, CAPILLARY: Glucose-Capillary: 123 mg/dL — ABNORMAL HIGH (ref 70–99)

## 2021-07-27 LAB — PREPARE RBC (CROSSMATCH)

## 2021-07-27 LAB — MRSA NEXT GEN BY PCR, NASAL: MRSA by PCR Next Gen: NOT DETECTED

## 2021-07-27 LAB — ABO/RH: ABO/RH(D): O POS

## 2021-07-27 SURGERY — WEDGE RESECTION, LUNG, ROBOT-ASSISTED, THORACOSCOPIC
Anesthesia: General | Site: Chest | Laterality: Right

## 2021-07-27 MED ORDER — HYDROMORPHONE HCL 1 MG/ML IJ SOLN
INTRAMUSCULAR | Status: AC
  Filled 2021-07-27: qty 1

## 2021-07-27 MED ORDER — DEXAMETHASONE SODIUM PHOSPHATE 10 MG/ML IJ SOLN
INTRAMUSCULAR | Status: AC
  Filled 2021-07-27: qty 1

## 2021-07-27 MED ORDER — ROCURONIUM BROMIDE 10 MG/ML (PF) SYRINGE
PREFILLED_SYRINGE | INTRAVENOUS | Status: AC
  Filled 2021-07-27: qty 10

## 2021-07-27 MED ORDER — ONDANSETRON HCL 4 MG/2ML IJ SOLN
4.0000 mg | Freq: Four times a day (QID) | INTRAMUSCULAR | Status: DC | PRN
  Administered 2021-07-29: 4 mg via INTRAVENOUS
  Filled 2021-07-27: qty 2

## 2021-07-27 MED ORDER — PHENOBARBITAL 32.4 MG PO TABS
64.8000 mg | ORAL_TABLET | Freq: Every day | ORAL | Status: DC
  Administered 2021-07-28 – 2021-07-29 (×3): 64.8 mg via ORAL
  Filled 2021-07-27 (×4): qty 2

## 2021-07-27 MED ORDER — LIDOCAINE 2% (20 MG/ML) 5 ML SYRINGE
INTRAMUSCULAR | Status: AC
  Filled 2021-07-27: qty 5

## 2021-07-27 MED ORDER — ACETAMINOPHEN 160 MG/5ML PO SOLN: 1000.0000 mg | Freq: Four times a day (QID) | ORAL | Status: DC

## 2021-07-27 MED ORDER — CEFAZOLIN SODIUM-DEXTROSE 2-4 GM/100ML-% IV SOLN
2.0000 g | INTRAVENOUS | Status: AC
  Administered 2021-07-27: 2 g via INTRAVENOUS
  Filled 2021-07-27: qty 100

## 2021-07-27 MED ORDER — SUGAMMADEX SODIUM 500 MG/5ML IV SOLN
INTRAVENOUS | Status: AC
  Filled 2021-07-27: qty 5

## 2021-07-27 MED ORDER — PHENYLEPHRINE HCL-NACL 20-0.9 MG/250ML-% IV SOLN
INTRAVENOUS | Status: DC | PRN
  Administered 2021-07-27: 20 ug/min via INTRAVENOUS

## 2021-07-27 MED ORDER — LACTATED RINGERS IV SOLN: INTRAVENOUS | Status: DC

## 2021-07-27 MED ORDER — FENTANYL CITRATE (PF) 100 MCG/2ML IJ SOLN
25.0000 ug | INTRAMUSCULAR | Status: AC | PRN
  Administered 2021-07-27 (×6): 25 ug via INTRAVENOUS

## 2021-07-27 MED ORDER — SODIUM CHLORIDE 0.9 % IV SOLN
INTRAVENOUS | Status: AC | PRN
  Administered 2021-07-27: 1000 mL

## 2021-07-27 MED ORDER — ALBUMIN HUMAN 5 % IV SOLN: INTRAVENOUS | Status: DC | PRN

## 2021-07-27 MED ORDER — SUGAMMADEX SODIUM 500 MG/5ML IV SOLN
INTRAVENOUS | Status: DC | PRN
  Administered 2021-07-27 (×3): 100 mg via INTRAVENOUS

## 2021-07-27 MED ORDER — FENTANYL CITRATE (PF) 250 MCG/5ML IJ SOLN
INTRAMUSCULAR | Status: DC | PRN
  Administered 2021-07-27 (×2): 50 ug via INTRAVENOUS
  Administered 2021-07-27: 25 ug via INTRAVENOUS
  Administered 2021-07-27: 75 ug via INTRAVENOUS
  Administered 2021-07-27: 50 ug via INTRAVENOUS

## 2021-07-27 MED ORDER — EYE HEALTH + LUTEIN PO TABS
1.0000 | ORAL_TABLET | Freq: Every day | ORAL | Status: DC
Start: 2021-07-28 — End: 2021-07-27

## 2021-07-27 MED ORDER — CHLORHEXIDINE GLUCONATE CLOTH 2 % EX PADS
6.0000 | MEDICATED_PAD | Freq: Every day | CUTANEOUS | Status: DC
Start: 2021-07-27 — End: 2021-07-29
  Administered 2021-07-27 – 2021-07-29 (×2): 6 via TOPICAL

## 2021-07-27 MED ORDER — PROPOFOL 10 MG/ML IV BOLUS
INTRAVENOUS | Status: AC
  Filled 2021-07-27: qty 20

## 2021-07-27 MED ORDER — PROPOFOL 10 MG/ML IV BOLUS
INTRAVENOUS | Status: DC | PRN
  Administered 2021-07-27: 110 mg via INTRAVENOUS
  Administered 2021-07-27: 20 mg via INTRAVENOUS

## 2021-07-27 MED ORDER — ENOXAPARIN SODIUM 40 MG/0.4ML IJ SOSY
40.0000 mg | PREFILLED_SYRINGE | INTRAMUSCULAR | Status: DC
  Administered 2021-07-28 – 2021-07-29 (×2): 40 mg via SUBCUTANEOUS
  Filled 2021-07-27 (×2): qty 0.4

## 2021-07-27 MED ORDER — ORAL CARE MOUTH RINSE: 15.0000 mL | Freq: Once | OROMUCOSAL | Status: AC

## 2021-07-27 MED ORDER — SENNOSIDES-DOCUSATE SODIUM 8.6-50 MG PO TABS
1.0000 | ORAL_TABLET | Freq: Every day | ORAL | Status: DC
  Administered 2021-07-27: 21:00:00 1 via ORAL
  Filled 2021-07-27 (×3): qty 1

## 2021-07-27 MED ORDER — FENTANYL CITRATE (PF) 100 MCG/2ML IJ SOLN
INTRAMUSCULAR | Status: AC
  Filled 2021-07-27: qty 2

## 2021-07-27 MED ORDER — SODIUM CHLORIDE 0.9% IV SOLUTION: Freq: Once | INTRAVENOUS | Status: DC

## 2021-07-27 MED ORDER — TRAMADOL HCL 50 MG PO TABS
50.0000 mg | ORAL_TABLET | Freq: Four times a day (QID) | ORAL | Status: DC | PRN
  Administered 2021-07-28 – 2021-07-29 (×3): 100 mg via ORAL
  Filled 2021-07-27 (×3): qty 2

## 2021-07-27 MED ORDER — CHLORHEXIDINE GLUCONATE 0.12 % MT SOLN
15.0000 mL | Freq: Once | OROMUCOSAL | Status: AC
  Administered 2021-07-27: 15 mL via OROMUCOSAL
  Filled 2021-07-27: qty 15

## 2021-07-27 MED ORDER — FENTANYL CITRATE (PF) 250 MCG/5ML IJ SOLN
INTRAMUSCULAR | Status: AC
  Filled 2021-07-27: qty 5

## 2021-07-27 MED ORDER — LACTATED RINGERS IV SOLN
INTRAVENOUS | Status: DC
Start: 2021-07-27 — End: 2021-07-27

## 2021-07-27 MED ORDER — LATANOPROST 0.005 % OP SOLN
1.0000 [drp] | Freq: Every day | OPHTHALMIC | Status: DC
  Administered 2021-07-27 – 2021-07-29 (×3): 1 [drp] via OPHTHALMIC
  Filled 2021-07-27: qty 2.5

## 2021-07-27 MED ORDER — PHENYLEPHRINE 40 MCG/ML (10ML) SYRINGE FOR IV PUSH (FOR BLOOD PRESSURE SUPPORT)
PREFILLED_SYRINGE | INTRAVENOUS | Status: DC | PRN
  Administered 2021-07-27: 40 ug via INTRAVENOUS

## 2021-07-27 MED ORDER — 0.9 % SODIUM CHLORIDE (POUR BTL) OPTIME
TOPICAL | Status: DC | PRN
  Administered 2021-07-27 (×2): 1000 mL

## 2021-07-27 MED ORDER — DROPERIDOL 2.5 MG/ML IJ SOLN: 0.6250 mg | Freq: Once | INTRAMUSCULAR | Status: DC | PRN

## 2021-07-27 MED ORDER — ACETAMINOPHEN 500 MG PO TABS
1000.0000 mg | ORAL_TABLET | Freq: Four times a day (QID) | ORAL | Status: DC
  Administered 2021-07-27 – 2021-07-30 (×10): 1000 mg via ORAL
  Filled 2021-07-27 (×11): qty 2

## 2021-07-27 MED ORDER — MIDAZOLAM HCL 2 MG/2ML IJ SOLN
INTRAMUSCULAR | Status: AC
  Filled 2021-07-27: qty 2

## 2021-07-27 MED ORDER — BUPIVACAINE HCL (PF) 0.5 % IJ SOLN
INTRAMUSCULAR | Status: AC
  Filled 2021-07-27: qty 30

## 2021-07-27 MED ORDER — BISACODYL 5 MG PO TBEC
10.0000 mg | DELAYED_RELEASE_TABLET | Freq: Every day | ORAL | Status: DC
  Administered 2021-07-27 – 2021-07-28 (×2): 10 mg via ORAL
  Filled 2021-07-27 (×3): qty 2

## 2021-07-27 MED ORDER — ROCURONIUM BROMIDE 10 MG/ML (PF) SYRINGE
PREFILLED_SYRINGE | INTRAVENOUS | Status: DC | PRN
  Administered 2021-07-27: 10 mg via INTRAVENOUS
  Administered 2021-07-27: 70 mg via INTRAVENOUS
  Administered 2021-07-27 (×2): 30 mg via INTRAVENOUS

## 2021-07-27 MED ORDER — ACETAMINOPHEN 500 MG PO TABS
1000.0000 mg | ORAL_TABLET | Freq: Once | ORAL | Status: AC
  Administered 2021-07-27: 1000 mg via ORAL
  Filled 2021-07-27: qty 2

## 2021-07-27 MED ORDER — DEXAMETHASONE SODIUM PHOSPHATE 10 MG/ML IJ SOLN
INTRAMUSCULAR | Status: DC | PRN
  Administered 2021-07-27: 5 mg via INTRAVENOUS

## 2021-07-27 MED ORDER — BUPIVACAINE LIPOSOME 1.3 % IJ SUSP
INTRAMUSCULAR | Status: AC
  Filled 2021-07-27: qty 20

## 2021-07-27 MED ORDER — HEMOSTATIC AGENTS (NO CHARGE) OPTIME
TOPICAL | Status: DC | PRN
  Administered 2021-07-27 (×2): 1 via TOPICAL

## 2021-07-27 MED ORDER — CEFAZOLIN SODIUM-DEXTROSE 2-4 GM/100ML-% IV SOLN
2.0000 g | Freq: Three times a day (TID) | INTRAVENOUS | Status: AC
  Administered 2021-07-27 – 2021-07-28 (×2): 2 g via INTRAVENOUS
  Filled 2021-07-27 (×2): qty 100

## 2021-07-27 MED ORDER — LACTATED RINGERS IV SOLN: INTRAVENOUS | Status: DC | PRN

## 2021-07-27 MED ORDER — FENTANYL CITRATE PF 50 MCG/ML IJ SOSY
25.0000 ug | PREFILLED_SYRINGE | INTRAMUSCULAR | Status: DC | PRN
  Administered 2021-07-27 – 2021-07-29 (×3): 25 ug via INTRAVENOUS
  Administered 2021-07-29: 50 ug via INTRAVENOUS
  Filled 2021-07-27 (×4): qty 1

## 2021-07-27 MED ORDER — ONDANSETRON HCL 4 MG/2ML IJ SOLN
INTRAMUSCULAR | Status: AC
  Filled 2021-07-27: qty 2

## 2021-07-27 MED ORDER — DORZOLAMIDE HCL-TIMOLOL MAL 2-0.5 % OP SOLN
1.0000 [drp] | Freq: Two times a day (BID) | OPHTHALMIC | Status: DC
  Administered 2021-07-27 – 2021-07-30 (×6): 1 [drp] via OPHTHALMIC
  Filled 2021-07-27: qty 10

## 2021-07-27 MED ORDER — POTASSIUM CHLORIDE IN NACL 20-0.9 MEQ/L-% IV SOLN
INTRAVENOUS | Status: DC
  Filled 2021-07-27 (×3): qty 1000

## 2021-07-27 MED ORDER — ASPIRIN 325 MG PO TABS: 325.0000 mg | ORAL_TABLET | Freq: Every day | ORAL | Status: DC

## 2021-07-27 MED ORDER — PROSIGHT PO TABS
1.0000 | ORAL_TABLET | Freq: Every day | ORAL | Status: DC
  Administered 2021-07-27 – 2021-07-30 (×4): 1 via ORAL
  Filled 2021-07-27 (×4): qty 1

## 2021-07-27 MED ORDER — ONDANSETRON HCL 4 MG/2ML IJ SOLN
INTRAMUSCULAR | Status: DC | PRN
  Administered 2021-07-27: 4 mg via INTRAVENOUS

## 2021-07-27 MED ORDER — HYDROMORPHONE HCL 1 MG/ML IJ SOLN
0.2500 mg | INTRAMUSCULAR | Status: DC | PRN
  Administered 2021-07-27 (×2): 0.25 mg via INTRAVENOUS

## 2021-07-27 MED ORDER — KETOROLAC TROMETHAMINE 15 MG/ML IJ SOLN
15.0000 mg | Freq: Four times a day (QID) | INTRAMUSCULAR | Status: AC
  Administered 2021-07-27 – 2021-07-29 (×8): 15 mg via INTRAVENOUS
  Filled 2021-07-27 (×8): qty 1

## 2021-07-27 MED ORDER — LIDOCAINE 2% (20 MG/ML) 5 ML SYRINGE
INTRAMUSCULAR | Status: DC | PRN
  Administered 2021-07-27: 100 mg via INTRAVENOUS

## 2021-07-27 MED ORDER — MIDAZOLAM HCL 5 MG/5ML IJ SOLN
INTRAMUSCULAR | Status: DC | PRN
Start: 2021-07-27 — End: 2021-07-27
  Administered 2021-07-27: .5 mg via INTRAVENOUS
  Administered 2021-07-27: 1 mg via INTRAVENOUS

## 2021-07-27 MED ORDER — SODIUM CHLORIDE FLUSH 0.9 % IV SOLN
INTRAVENOUS | Status: DC | PRN
  Administered 2021-07-27: 100 mL

## 2021-07-27 MED ORDER — PHENYTOIN SODIUM EXTENDED 100 MG PO CAPS
200.0000 mg | ORAL_CAPSULE | Freq: Every day | ORAL | Status: DC
  Administered 2021-07-27 – 2021-07-29 (×3): 200 mg via ORAL
  Filled 2021-07-27 (×4): qty 2

## 2021-07-27 SURGICAL SUPPLY — 121 items
ADH SKN CLS APL DERMABOND .7 (GAUZE/BANDAGES/DRESSINGS) ×1
ADH SKN CLS LQ APL DERMABOND (GAUZE/BANDAGES/DRESSINGS) ×1
APPLIER CLIP ROT 10 11.4 M/L (STAPLE)
APR CLP MED LRG 11.4X10 (STAPLE)
BAG SPEC RTRVL C1550 15 (MISCELLANEOUS) ×1
BAG TISS RTRVL C300 12X14 (MISCELLANEOUS) ×1
BLADE CLIPPER SURG (BLADE) ×1 IMPLANT
BNDG COHESIVE 6X5 TAN STRL LF (GAUZE/BANDAGES/DRESSINGS) IMPLANT
CANISTER SUCT 3000ML PPV (MISCELLANEOUS) ×4 IMPLANT
CANNULA REDUC XI 12-8 STAPL (CANNULA) ×4
CANNULA REDUCER 12-8 DVNC XI (CANNULA) ×2 IMPLANT
CLIP APPLIE ROT 10 11.4 M/L (STAPLE) IMPLANT
CLIP VESOCCLUDE MED 6/CT (CLIP) IMPLANT
CNTNR URN SCR LID CUP LEK RST (MISCELLANEOUS) ×5 IMPLANT
CONN ST 1/4X3/8  BEN (MISCELLANEOUS)
CONN ST 1/4X3/8 BEN (MISCELLANEOUS) IMPLANT
CONT SPEC 4OZ STRL OR WHT (MISCELLANEOUS) ×40
DEFOGGER SCOPE WARMER CLEARIFY (MISCELLANEOUS) ×2 IMPLANT
DERMABOND ADHESIVE PROPEN (GAUZE/BANDAGES/DRESSINGS) ×1
DERMABOND ADVANCED (GAUZE/BANDAGES/DRESSINGS) ×1
DERMABOND ADVANCED .7 DNX12 (GAUZE/BANDAGES/DRESSINGS) ×1 IMPLANT
DERMABOND ADVANCED .7 DNX6 (GAUZE/BANDAGES/DRESSINGS) IMPLANT
DRAIN CHANNEL 28F RND 3/8 FF (WOUND CARE) ×1 IMPLANT
DRAIN CHANNEL 32F RND 10.7 FF (WOUND CARE) IMPLANT
DRAPE ARM DVNC X/XI (DISPOSABLE) ×4 IMPLANT
DRAPE COLUMN DVNC XI (DISPOSABLE) ×1 IMPLANT
DRAPE CV SPLIT W-CLR ANES SCRN (DRAPES) ×2 IMPLANT
DRAPE DA VINCI XI ARM (DISPOSABLE) ×8
DRAPE DA VINCI XI COLUMN (DISPOSABLE) ×2
DRAPE HALF SHEET 40X57 (DRAPES) ×2 IMPLANT
DRAPE INCISE IOBAN 66X45 STRL (DRAPES) IMPLANT
DRAPE ORTHO SPLIT 77X108 STRL (DRAPES) ×2
DRAPE SURG ORHT 6 SPLT 77X108 (DRAPES) ×1 IMPLANT
ELECT BLADE 6.5 EXT (BLADE) IMPLANT
ELECT REM PT RETURN 9FT ADLT (ELECTROSURGICAL) ×2
ELECTRODE REM PT RTRN 9FT ADLT (ELECTROSURGICAL) ×1 IMPLANT
GAUZE 4X4 16PLY ~~LOC~~+RFID DBL (SPONGE) ×2 IMPLANT
GAUZE KITTNER 4X5 RF (MISCELLANEOUS) ×5 IMPLANT
GAUZE SPONGE 4X4 12PLY STRL (GAUZE/BANDAGES/DRESSINGS) ×2 IMPLANT
GLOVE SURG MICRO LTX SZ7.5 (GLOVE) ×4 IMPLANT
GOWN STRL REUS W/ TWL LRG LVL3 (GOWN DISPOSABLE) ×2 IMPLANT
GOWN STRL REUS W/ TWL XL LVL3 (GOWN DISPOSABLE) ×2 IMPLANT
GOWN STRL REUS W/TWL 2XL LVL3 (GOWN DISPOSABLE) ×2 IMPLANT
GOWN STRL REUS W/TWL LRG LVL3 (GOWN DISPOSABLE) ×4
GOWN STRL REUS W/TWL XL LVL3 (GOWN DISPOSABLE) ×4
HEMOSTAT SURGICEL 2X14 (HEMOSTASIS) ×5 IMPLANT
IRRIGATION STRYKERFLOW (MISCELLANEOUS) ×1 IMPLANT
IRRIGATOR STRYKERFLOW (MISCELLANEOUS) ×2
KIT BASIN OR (CUSTOM PROCEDURE TRAY) ×2 IMPLANT
KIT SUCTION CATH 14FR (SUCTIONS) IMPLANT
KIT TURNOVER KIT B (KITS) ×2 IMPLANT
NDL HYPO 25GX1X1/2 BEV (NEEDLE) ×1 IMPLANT
NDL SPNL 22GX3.5 QUINCKE BK (NEEDLE) ×1 IMPLANT
NEEDLE HYPO 25GX1X1/2 BEV (NEEDLE) ×2 IMPLANT
NEEDLE SPNL 22GX3.5 QUINCKE BK (NEEDLE) ×2 IMPLANT
NS IRRIG 1000ML POUR BTL (IV SOLUTION) ×2 IMPLANT
PACK CHEST (CUSTOM PROCEDURE TRAY) ×2 IMPLANT
PAD ARMBOARD 7.5X6 YLW CONV (MISCELLANEOUS) ×4 IMPLANT
PORT ACCESS TROCAR AIRSEAL 12 (TROCAR) ×1 IMPLANT
PORT ACCESS TROCAR AIRSEAL 5M (TROCAR) ×1
RELOAD STAPLE 45 2.5 WHT DVNC (STAPLE) IMPLANT
RELOAD STAPLE 45 3.5 BLU DVNC (STAPLE) IMPLANT
RELOAD STAPLE 45 4.3 GRN DVNC (STAPLE) IMPLANT
RELOAD STAPLE 45 4.6 BLK DVNC (STAPLE) IMPLANT
RELOAD STAPLER 2.5X45 WHT DVNC (STAPLE) ×4 IMPLANT
RELOAD STAPLER 3.5X45 BLU DVNC (STAPLE) ×1 IMPLANT
RELOAD STAPLER 4.3X45 GRN DVNC (STAPLE) ×4 IMPLANT
RELOAD STAPLER 45 4.6 BLK DVNC (STAPLE) ×1 IMPLANT
SCISSORS LAP 5X35 DISP (ENDOMECHANICALS) IMPLANT
SEAL CANN UNIV 5-8 DVNC XI (MISCELLANEOUS) ×2 IMPLANT
SEAL XI 5MM-8MM UNIVERSAL (MISCELLANEOUS) ×4
SEALANT PROGEL (MISCELLANEOUS) IMPLANT
SET TRI-LUMEN FLTR TB AIRSEAL (TUBING) ×2 IMPLANT
SOLUTION ELECTROLUBE (MISCELLANEOUS) ×3 IMPLANT
SPONGE INTESTINAL PEANUT (DISPOSABLE) IMPLANT
SPONGE T-LAP 18X18 ~~LOC~~+RFID (SPONGE) ×5 IMPLANT
SPONGE T-LAP 4X18 ~~LOC~~+RFID (SPONGE) ×1 IMPLANT
SPONGE TONSIL 1 RF SGL (DISPOSABLE) ×1 IMPLANT
SPONGE TONSIL TAPE 1 RFD (DISPOSABLE) IMPLANT
STAPLER 45 SUREFORM CVD (STAPLE) ×2
STAPLER 45 SUREFORM CVD DVNC (STAPLE) IMPLANT
STAPLER CANNULA SEAL DVNC XI (STAPLE) ×2 IMPLANT
STAPLER CANNULA SEAL XI (STAPLE) ×4
STAPLER RELOAD 2.5X45 WHITE (STAPLE) ×8
STAPLER RELOAD 2.5X45 WHT DVNC (STAPLE) ×4
STAPLER RELOAD 3.5X45 BLU DVNC (STAPLE) ×1
STAPLER RELOAD 3.5X45 BLUE (STAPLE) ×2
STAPLER RELOAD 4.3X45 GREEN (STAPLE) ×8
STAPLER RELOAD 4.3X45 GRN DVNC (STAPLE) ×4
STAPLER RELOAD 45 4.6 BLK (STAPLE) ×2
STAPLER RELOAD 45 4.6 BLK DVNC (STAPLE) ×1
SUT PDS AB 0 CT 36 (SUTURE) ×1 IMPLANT
SUT PDS AB 3-0 SH 27 (SUTURE) IMPLANT
SUT PROLENE 4 0 RB 1 (SUTURE)
SUT PROLENE 4-0 RB1 .5 CRCL 36 (SUTURE) IMPLANT
SUT SILK  1 MH (SUTURE) ×4
SUT SILK 1 MH (SUTURE) ×2 IMPLANT
SUT SILK 1 TIES 10X30 (SUTURE) IMPLANT
SUT SILK 2 0 SH (SUTURE) ×1 IMPLANT
SUT SILK 2 0SH CR/8 30 (SUTURE) IMPLANT
SUT SILK 3 0SH CR/8 30 (SUTURE) IMPLANT
SUT VIC AB 1 CTX 36 (SUTURE) ×2
SUT VIC AB 1 CTX36XBRD ANBCTR (SUTURE) IMPLANT
SUT VIC AB 2-0 CTX 36 (SUTURE) ×1 IMPLANT
SUT VIC AB 3-0 MH 27 (SUTURE) IMPLANT
SUT VIC AB 3-0 X1 27 (SUTURE) ×4 IMPLANT
SUT VICRYL 0 TIES 12 18 (SUTURE) ×2 IMPLANT
SUT VICRYL 0 UR6 27IN ABS (SUTURE) ×4 IMPLANT
SUT VICRYL 2 TP 1 (SUTURE) IMPLANT
SYR 20ML LL LF (SYRINGE) ×4 IMPLANT
SYSTEM RETRIEVAL ANCHOR 12 (MISCELLANEOUS) ×1 IMPLANT
SYSTEM RETRIEVAL ANCHOR 15 (MISCELLANEOUS) ×1 IMPLANT
SYSTEM SAHARA CHEST DRAIN ATS (WOUND CARE) ×2 IMPLANT
TAPE CLOTH 4X10 WHT NS (GAUZE/BANDAGES/DRESSINGS) ×2 IMPLANT
TIP APPLICATOR SPRAY EXTEND 16 (VASCULAR PRODUCTS) IMPLANT
TOWEL GREEN STERILE (TOWEL DISPOSABLE) ×4 IMPLANT
TRAY FOLEY MTR SLVR 16FR STAT (SET/KITS/TRAYS/PACK) ×2 IMPLANT
TROCAR BLADELESS 15MM (ENDOMECHANICALS) IMPLANT
TROCAR XCEL 12X100 BLDLESS (ENDOMECHANICALS) IMPLANT
TROCAR XCEL BLADELESS 5X75MML (TROCAR) IMPLANT
WATER STERILE IRR 1000ML POUR (IV SOLUTION) ×2 IMPLANT

## 2021-07-27 NOTE — Anesthesia Procedure Notes (Signed)
Arterial Line Insertion Start/End2/16/2023 9:15 AM Performed by: Janene Harvey, CRNA, CRNA  Patient location: Pre-op. Preanesthetic checklist: patient identified, IV checked, risks and benefits discussed and pre-op evaluation Lidocaine 1% used for infiltration and patient sedated Left, radial was placed Catheter size: 20 G Hand hygiene performed  and maximum sterile barriers used  Allen's test indicative of satisfactory collateral circulation Attempts: 1 Procedure performed without using ultrasound guided technique. Following insertion, dressing applied and Biopatch. Post procedure assessment: unchanged  Patient tolerated the procedure well with no immediate complications.

## 2021-07-27 NOTE — Brief Op Note (Addendum)
07/27/2021  12:58 PM  PATIENT:  Alan Melendez  82 y.o. 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   DISPOSITION OF SPECIMEN:  PATHOLOGY. Frozen of wedge RUL showed adenocarcinoma. Bronchial margin of RUL negative for cancer  COUNTS CORRECT:  YES  DICTATION: .Dragon Dictation  PLAN OF CARE: Admit to inpatient   PATIENT DISPOSITION:  PACU - hemodynamically stable.   Delay start of Pharmacological VTE agent (>24hrs) due to surgical blood loss or risk of bleeding: no

## 2021-07-27 NOTE — Interval H&P Note (Signed)
History and Physical Interval Note:  Cleared by Cardiology All questions answered  07/27/2021 8:16 AM  Alan Melendez  has presented today for surgery, with the diagnosis of RUL NODULE.  The various methods of treatment have been discussed with the patient and family. After consideration of risks, benefits and other options for treatment, the patient has consented to  Procedure(s): XI ROBOTIC ASSISTED THORASCOPY-WEDGE RESECTION, possible lobectomy (Right) as a surgical intervention.  The patient's history has been reviewed, patient examined, no change in status, stable for surgery.  I have reviewed the patient's chart and labs.  Questions were answered to the patient's satisfaction.     [name]

## 2021-07-27 NOTE — Op Note (Signed)
NAME: Alan Melendez, Alan Melendez MEDICAL RECORD NO: [id number] ACCOUNT NO: [id number] DATE OF BIRTH: [birth year] FACILITY: [facility] LOCATION: [facility] PHYSICIAN: Revonda Standard. Roxan Hockey, MD  Operative Report   DATE OF PROCEDURE: 07/27/2021  PREOPERATIVE DIAGNOSIS:  Right upper lobe lung nodule.  POSTOPERATIVE DIAGNOSIS:  Non-small cell carcinoma, right upper lobe- clinical stage IA (T1, N0).  PROCEDURE:   Xi robotic-assisted right thoracoscopy,  Wedge resection of right upper lobe nodule,  Right upper lobectomy,  Lymph node dissection and  Intercostal nerve blocks levels 3 through 10.  SURGEON:  Revonda Standard. Roxan Hockey, MD  ASSISTANT:  Lars Pinks, PA-C.  ANESTHESIA:  General.  FINDINGS:  Frozen section of nodule revealed non-small cell carcinoma.  Bronchial margin negative for tumor.  CLINICAL NOTE: Alan Melendez is a 81 year old man with a history of tobacco abuse, who was found to have a right upper lobe lung nodule. On PET CT, the nodule was markedly hypermetabolic and he was referred for possible surgical resection.  The  indications, risks, benefits, and alternatives were discussed in detail with the patient.  He understood and accepted the risks and agreed to proceed.  OPERATIVE NOTE: Alan Melendez was brought to the preoperative holding area on 07/27/2021.  Anesthesia placed an arterial blood pressure monitoring line and established intravenous access. He was taken to the operating room and anesthetized and intubated  with a double lumen endotracheal tube.  Intravenous antibiotics were administered.  A Foley catheter was placed.  Sequential compression devices were placed on the calves for DVT prophylaxis.  He was placed in a left lateral decubitus position and the  right chest was prepped and draped in the usual sterile fashion.  Single lung ventilation of the left lung was initiated and was tolerated well throughout the procedure.  A timeout was performed.  A solution containing 20 mL of  liposomal bupivacaine, 30 mL of 0.5% bupivacaine and 50 mL of saline was prepared.  This was used for local at the incision sites as well as for the intercostal nerve blocks.  An incision was made  in the eighth interspace in the midaxillary line and an 8 mm robotic port was inserted.  The thoracoscope was advanced into the chest.  After confirming intrapleural placement, carbon dioxide was insufflated per protocol.  A 12 mm port was placed in the  eighth interspace anterior to the camera port.  Intercostal nerve blocks were performed from the third to the tenth interspace injecting 10 mL of the bupivacaine solution into a subpleural plane at each level.  A 12 mm AirSeal port was placed in the  tenth interspace posterolaterally and 2 additional eighth interspace robotic ports were placed. The robot was deployed, the camera arm was docked, targeting was performed.  The remaining arms were docked.  The robotic instruments were inserted with  thoracoscopic visualization.  There was no pleural effusion and no abnormality of the parietal pleura.  The upper lobe was retracted downward and there was invagination of the visceral pleura near the apex at the site where the nodule was expected to be. Palpation of the lung with  the robotic instruments showed that the nodule was clearly in that area.  A wedge resection was performed with sequential firings of the robotic stapler using green and black cartridges.  The specimen was placed into an endoscopic retrieval bag, removed, and sent for  frozen section.  Because of the high index of suspicion, the procedure continued while awaiting the results of the frozen section. The lower lobe was retracted  superiorly and the inferior pulmonary ligament was divided. All lymph nodes that were  encountered during the dissection were removed and sent as separate specimens for permanent pathology.  Level 9 nodes were removed.  The pleural reflection was divided at the hilum  posteriorly.  Working superiorly, multiple level 7 nodes were removed, 2  of these nodes were relatively large, but otherwise benign, but there were large bronchial arteries and there was some bleeding, which was controlled with bipolar cautery and packed with gauze. Dissection was carried up along the bronchus  intermedius to the bifurcation of the upper lobe bronchus from the bronchus intermedius.  Dissection was carried superiorly and level 10 nodes were removed.  The pleura was incised over the mediastinum superior to the azygos vein and the level 4R nodes were  removed. There was some minor bleeding at that site and Surgicel was packed into it as well.  At this point, the frozen section returned showing non-small cell carcinoma and we proceed with lobectomy as planned.  The pleural reflection was divided  anteriorly and the pulmonary veins were identified.  Inspection of the fissure showed the minor fissure was complete and no stapling was required in that area.  The pleura overlying the pulmonary artery was incised with bipolar cautery.  This dissection  was carried laterally. Vascularized lymph nodes were encountered.  There was a small part of the fissure posteriorly which was not complete and that was divided with a stapler.  Additional lymph nodes were removed.  A level 11 node was removed and the  posterior ascending branch of the pulmonary artery was clearly demarcated.  It was encircled and divided with the vascular staple cartridge.  The pulmonary veins were dissected out.  The superior pulmonary vein had a small middle lobe  branch feeding into the posterior right upper lobe branch.  The posterior right upper lobe branch was divided separately preserving the small middle lobe branch and then the remaining branches from the upper lobe were encircled and divided again using  the robotic stapler.  Additional dissection was carried out along the pulmonary artery and there was a large anterior  apical trunk.  Once this had been dissected out, the robotic stapler was used to divide it as well.  Additional nodes were removed from  around the bronchus.  The robotic stapler was placed across the right upper lobe bronchus.  A green cartridge was used for the bronchial staple line.  The stapler was closed.  A test inflation was performed.  The lower and middle lobes showed good aeration.  The  stapler was fired, transecting the right upper lobe bronchus.  The specimen was left in place initially. The chest was copiously irrigated with warm saline.  A test inflation to 30 cm water revealed no air leakage from the bronchial stump or the staple  line along the superior segment.  The sponges that had been placed were removed with the exception of one which was removed later.  The robotic instruments were removed and the robot was undocked.  The anterior eighth interspace incision was lengthened to approximately 3 cm.  The upper lobe was manipulated into a 15 mm endoscopic retrieval bag.  The specimen was removed and sent for frozen section of the bronchial margin, which subsequently  returned with no tumor seen.  Chest was again copiously irrigated.  After ensuring that all sponges had been removed, a 28-French Blake drain was placed through original port incision and directed to the apex.  It was secured with #1 silk suture.  The  incisions then were closed in standard fashion.  Dermabond was applied.  The patient was placed back in a supine position.  The chest tube was placed to a Pleur-evac on waterseal.  He was extubated in the operating room and taken to the postanesthetic  care unit in good condition.  All sponge, needle and instrument counts were correct at the end of the procedure.  Experienced assistance was necessary for this case due to complexity.  Lars Pinks assisted providing assistance with port placement, instrument exchange, suctioning, specimen retrieval and exposure, and wound  closure.   NIK D: 07/27/2021 5:43:48 pm T: 07/27/2021 11:53:00 pm  JOB: [id number]/ [id number]

## 2021-07-27 NOTE — Discharge Instructions (Signed)
Robot-Assisted Thoracic Surgery, Care After The following information offers guidance on how to care for yourself after your procedure. Your health care provider may also give you more specific instructions. If you have problems or questions, contact your health care provider. What can I expect after the procedure? After the procedure, it is common to have: Some pain and aches in the area of your surgical incisions. Pain when breathing in (inhaling) and coughing. Tiredness (fatigue). Trouble sleeping. Constipation. Follow these instructions at home: Medicines Take over-the-counter and prescription medicines only as told by your health care provider. If you were prescribed an antibiotic medicine, take it as told by your health care provider. Do not stop taking the antibiotic even if you start to feel better. Talk with your health care provider about safe and effective ways to manage pain after your procedure. Pain management should fit your specific health needs. Take pain medicine before pain becomes severe. Relieving and controlling your pain will make breathing easier for you. Ask your health care provider if the medicine prescribed to you requires you to avoid driving or using machinery. Eating and drinking Follow instructions from your health care provider about eating or drinking restrictions. These will vary depending on what procedure you had. Your health care provider may recommend: A liquid diet or soft diet for the first few days. Meals that are smaller and more frequent. A diet of fruits, vegetables, whole grains, and low-fat proteins. Limiting foods that are high in fat and processed sugar, including fried or sweet foods. Incision care Follow instructions from your health care provider about how to take care of your incisions. Make sure you: Wash your hands with soap and water for at least 20 seconds before and after you change your bandage (dressing). If soap and water are not  available, use hand sanitizer. Change your dressing as told by your health care provider. Leave stitches (sutures), skin glue, or adhesive strips in place. These skin closures may need to stay in place for 2 weeks or longer. If adhesive strip edges start to loosen and curl up, you may trim the loose edges. Do not remove adhesive strips completely unless your health care provider tells you to do that. Check your incision area every day for signs of infection. Check for: Redness, swelling, or more pain. Fluid or blood. Warmth. Pus or a bad smell. Activity Return to your normal activities as told by your health care provider. Ask your health care provider what activities are safe for you. Ask your health care provider when it is safe for you to drive. Do not lift anything that is heavier than 10 lb (4.5 kg), or the limit that you are told, until your health care provider says that it is safe. Rest as told by your health care provider. Avoid sitting for a long time without moving. Get up to take short walks every 1-2 hours. This is important to improve blood flow and breathing. Ask for help if you feel weak or unsteady. Do exercises as told by your health care provider. Pneumonia prevention Do deep breathing exercises and cough regularly as directed. This helps clear mucus and opens your lungs. Doing this helps prevent lung infection (pneumonia). If you were given an incentive spirometer, use it as told. An incentive spirometer is a tool that measures how well you are filling your lungs with each breath. Coughing may hurt less if you try to support your chest. This is called splinting. Try one of these when you cough:  Hold a pillow against your chest. Place the palms of both hands on top of your incision area. Do not use any products that contain nicotine or tobacco. These products include cigarettes, chewing tobacco, and vaping devices, such as e-cigarettes. If you need help quitting, ask your  health care provider. Avoid secondhand smoke. General instructions If you have a drainage tube: Follow instructions from your health care provider about how to take care of it. Do not travel by airplane after your tube is removed until your health care provider tells you it is safe. You may need to take these actions to prevent or treat constipation: Drink enough fluid to keep your urine pale yellow. Take over-the-counter or prescription medicines. Eat foods that are high in fiber, such as beans, whole grains, and fresh fruits and vegetables. Limit foods that are high in fat and processed sugars, such as fried or sweet foods. Keep all follow-up visits. This is important. Contact a health care provider if: You have redness, swelling, or more pain around an incision. You have fluid or blood coming from an incision. An incision feels warm to the touch. You have pus or a bad smell coming from an incision. You have a fever. You cannot eat or drink without vomiting. Your pain medicine is not controlling your pain. Get help right away if: You have chest pain. Your heart is beating quickly. You have trouble breathing. You have trouble speaking. You are confused. You feel weak or dizzy, or you faint. These symptoms may represent a serious problem that is an emergency. Do not wait to see if the symptoms will go away. Get medical help right away. Call your local emergency services (911 in the [place] Do not drive yourself to the hospital. Summary Talk with your health care provider about safe and effective ways to manage pain after your procedure. Pain management should fit your specific health needs. Return to your normal activities as told by your health care provider. Ask your health care provider what activities are safe for you. Do deep breathing exercises and cough regularly as directed. This helps to clear mucus and prevent pneumonia. If it hurts to cough, ease pain by holding a pillow  against your chest or by placing the palms of both hands over your incisions. This information is not intended to replace advice given to you by your health care provider. Make sure you discuss any questions you have with your health care provider. Document Revised: 02/19/2020 Document Reviewed: 02/19/2020 Elsevier Patient Education  2022 [employer].

## 2021-07-27 NOTE — Anesthesia Procedure Notes (Signed)
Central Venous Catheter Insertion Performed by: Nolon Nations, MD, anesthesiologist Start/End2/16/2023 8:55 AM, 07/27/2021 9:20 AM Patient location: Pre-op. Preanesthetic checklist: patient identified, IV checked, site marked, risks and benefits discussed, surgical consent, monitors and equipment checked, pre-op evaluation, timeout performed and anesthesia consent Lidocaine 1% used for infiltration and patient sedated Hand hygiene performed  and maximum sterile barriers used  Catheter size: 8.5 Fr Sheath introducer Procedure performed using ultrasound guided technique. Ultrasound Notes:anatomy identified, needle tip was noted to be adjacent to the nerve/plexus identified, no ultrasound evidence of intravascular and/or intraneural injection and image(s) printed for medical record Attempts: 1 Following insertion, line sutured, dressing applied and Biopatch. Post procedure assessment: blood return through all ports, free fluid flow and no air  Patient tolerated the procedure well with no immediate complications.

## 2021-07-27 NOTE — Transfer of Care (Signed)
Immediate Anesthesia Transfer of Care Note  Patient: Alan Melendez  Procedure(s) Performed: XI ROBOTIC ASSISTED THORASCOPY-RIGHT UPPER LOBE  WEDGE RESECTION, RIGHT UPPER LOBECTOMY (Right: Chest) INTERCOSTAL NERVE BLOCK (Right: Chest) LYMPH NODE DISSECTION (Right: Chest)  Patient Location: PACU  Anesthesia Type:General  Level of Consciousness: drowsy and patient cooperative  Airway & Oxygen Therapy: Patient Spontanous Breathing and Patient connected to face mask oxygen  Post-op Assessment: Report given to RN and Post -op Vital signs reviewed and stable  Post vital signs: Reviewed and stable  Last Vitals:  Vitals Value Taken Time  BP 153/78 07/27/21 1321  Temp    Pulse 66 07/27/21 1324  Resp 16 07/27/21 1324  SpO2 99 % 07/27/21 1324  Vitals shown include unvalidated device data.  Last Pain:  Vitals:   07/27/21 0807  PainSc: 0-No pain         Complications: No notable events documented.

## 2021-07-27 NOTE — Anesthesia Procedure Notes (Addendum)
Procedure Name: Intubation Date/Time: 07/27/2021 9:49 AM Performed by: Janene Harvey, CRNA Pre-anesthesia Checklist: Patient identified, Emergency Drugs available, Suction available and Patient being monitored Patient Re-evaluated:Patient Re-evaluated prior to induction Oxygen Delivery Method: Circle system utilized Preoxygenation: Pre-oxygenation with 100% oxygen Induction Type: IV induction Ventilation: Two handed mask ventilation required and Oral airway inserted - appropriate to patient size Laryngoscope Size: Mac and 4 Grade View: Grade I Tube type: Oral Endobronchial tube: Left, Double lumen EBT, EBT position confirmed by fiberoptic bronchoscope and EBT position confirmed by auscultation and 39 Fr Number of attempts: 1 Airway Equipment and Method: Stylet and Oral airway Placement Confirmation: ETT inserted through vocal cords under direct vision, positive ETCO2 and breath sounds checked- equal and bilateral Secured at: 31 cm Tube secured with: Tape Dental Injury: Teeth and Oropharynx as per pre-operative assessment

## 2021-07-27 NOTE — Hospital Course (Addendum)
HPI: This is a 81 year old man with a history of heavy tobacco abuse, COPD, CAD, MI, coronary stents, colon cancer, glaucoma, and epilepsy.  He is a very poor historian.  Fortunately he was accompanied by his daughter who is familiar with his medical history.  He smoked about 2 packs a day for almost 50 years.  He quit about 3 years ago after his heart attack.  He had a colon cancer resection in April 2021.  At some point he was found to have a right upper lobe lung nodule.  Recently he was referred to Dr. Melvyn Novas.  A PET/CT showed a 1.5 cm spiculated nodule in the right upper lobe that was markedly hypermetabolic.  There was no regional or distant metastatic disease.  He says that he gets short of breath with heavy exertion.  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  He can walk up a flight of stairs although he would be winded when he got to the top.  He can walk for a long distance on level ground without any issues.  He denies any recent chest pain, pressure, or tightness.  He last saw Dr. Curlene Labrum, his cardiologist, in August 2022.   Dr. Roxan Hockey discussed the proposed surgical procedure which would be a robotic right VATS for wedge resection and possible right upper lobectomy.  That would depend on the results of the intraoperative frozen section. He discussed potential risks, benefits, and complications of the surgery with patient and his daughter and the patient agreed to proceed with surgery.  Hospital Course: Patient underwent a Xi robotic assisted right thoracoscopy, wedge RUL, RUL, lymph node dissection, and intercostal nerve block. He was extubated and then transferred to PACU in stable condition. Chest tube was to water seal. There was tidling with cough but no true air leak. Daily chest x rays were obtained and showed small right pneumothorax.  His chest tube was removed on 07/29/2020.  Follow up CXR showed stable pneumothorax. He has been tolerating a diet. He was  on 2 liters of oxygen then weaned to room air with good oxygenation. All wounds are clean, dry, and healing without signs of infection.  He is felt medically stable for discharge home today.

## 2021-07-27 NOTE — Discharge Summary (Addendum)
hysician Discharge Summary       [address]       [place] [address]             [phone]    Patient ID: Alan Melendez MRN: [id number] DOB/AGE: 02/27/1941 81 y.o.  Admit date: 07/27/2021 Discharge date: 07/30/2021  Admission Diagnoses: Right upper lobe nodulue Discharge Diagnoses:  Adenocarcinoma RUL-pathologic stage Ib (T2a, N0) S/p Xi robotic assisted right thoracoscopy, wedge RUL, RUL, LN dissection, and intercostal nerve block\ History of colon cancer ([place]) History of epilepsy ([place]) History of glaucoma History of bilateral cataracts History of anxiety History of heart attack ([place]) History of tobacco abuse   Consults: None  Procedure (s):  Xi robotic-assisted right thoracoscopy, wedge resection of right upper lobe nodule, right upper lobectomy, lymph node dissection and intercostal nerve blocks levels 3 through 10 by Dr. Roxan Hockey on 07/27/2021.  Pathology:**  HPI: This is a 81 year old man with a history of heavy tobacco abuse, COPD, CAD, MI, coronary stents, colon cancer, glaucoma, and epilepsy.  He is a very poor historian.  Fortunately he was accompanied by his daughter who is familiar with his medical history.  He smoked about 2 packs a day for almost 50 years.  He quit about 3 years ago after his heart attack.  He had a colon cancer resection in April 2021.  At some point he was found to have a right upper lobe lung nodule.  Recently he was referred to Dr. Melvyn Novas.  A PET/CT showed a 1.5 cm spiculated nodule in the right upper lobe that was markedly hypermetabolic.  There was no regional or distant metastatic disease.  He says that he gets short of breath with heavy exertion.  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  He can walk up a flight of stairs although he would be winded when he got to the top.  He can walk for a long distance on level ground without any issues.  He denies any recent chest pain, pressure, or  tightness.  He last saw Dr. Curlene Labrum, his cardiologist, in August 2022.   Dr. Roxan Hockey discussed the proposed surgical procedure which would be a robotic right VATS for wedge resection and possible right upper lobectomy.  That would depend on the results of the intraoperative frozen section. He discussed potential risks, benefits, and complications of the surgery with patient and his daughter and the patient agreed to proceed with surgery.  Hospital Course: Patient underwent a Xi robotic assisted right thoracoscopy, wedge RUL, RUL, lymph node dissection, and intercostal nerve block. He was extubated and then transferred to PACU in stable condition. Chest tube was to water seal. There was tidling with cough but no true air leak. Daily chest x rays were obtained and showed small right pneumothorax.  His chest tube was removed on 07/29/2020.  Follow up CXR showed stable pneumothorax. He has been tolerating a diet. He was on 2 liters of oxygen then weaned to room air with good oxygenation. All wounds are clean, dry, and healing without signs of infection.  He is felt medically stable for discharge home today.    Latest Vital Signs: Blood pressure 133/76, pulse 80, temperature 97.9 F (36.6 C), temperature source Oral, resp. rate 20, height 6' (1.829 m), weight 110 kg, SpO2 96 %.  Physical Exam: performed by Dr. Roxan Hockey  General appearance: alert, cooperative, and no distress Neurologic: intact Heart: regular rate and rhythm Lungs: diminished breath sounds right  base Wound: clean and dry  Discharge Condition:Stable and discharged to home.  Recent laboratory studies:  Lab Results  Component Value Date   WBC 9.7 07/29/2021   HGB 10.8 (L) 07/29/2021   HCT 33.9 (L) 07/29/2021   MCV 91.1 07/29/2021   PLT 179 07/29/2021   Lab Results  Component Value Date   NA 138 07/29/2021   K 3.9 07/29/2021   CL 104 07/29/2021   CO2 27 07/29/2021   CREATININE 0.78 07/29/2021   GLUCOSE 122 (H)  07/29/2021      Diagnostic Studies: DG Chest 2 View  Result Date: 07/30/2021 CLINICAL DATA:  Status post lobectomy EXAM: CHEST - 2 VIEW COMPARISON:  07/29/2021 FINDINGS: Unchanged cardiomediastinal silhouette. Stable small right apical pneumothorax. Unchanged right middle lobe opacities. Small right pleural effusion. There is a chronic midthoracic compression deformity seen on a prior CT in February 2021. IMPRESSION: Stable small right apical pneumothorax. Small right pleural effusion. Right middle lobe consolidation, could be atelectasis or infection. Electronically Signed   By: Maurine Simmering M.D.   On: 07/30/2021 08:52   DG Chest 2 View  Result Date: 07/25/2021 CLINICAL DATA:  Right upper lung nodule. EXAM: CHEST - 2 VIEW COMPARISON:  09/02/2020. FINDINGS: The heart size and mediastinal contours are within normal limits. There is mild atherosclerotic calcification of the aorta. No consolidation, effusion, or pneumothorax. There is a vague spiculated nodule in the right upper lobe measuring 1.1 cm. IMPRESSION: 1. No active cardiopulmonary disease. 2. Vague nodule in the right upper lobe measuring 1.1 cm. Electronically Signed   By: Brett Fairy M.D.   On: 07/25/2021 23:21   DG Chest 1V REPEAT Same Day  Result Date: 07/29/2021 CLINICAL DATA:  Removal of right chest tube EXAM: CHEST - 1 VIEW SAME DAY COMPARISON:  Previous studies including the examination done earlier today FINDINGS: There is interval removal of right chest tube. There is small right apical pneumothorax. Cardiac size is within normal limits. There are no signs of pulmonary edema. Increased markings are seen in the right lower lung fields. Small transverse linear density in the left lower lung fields has not changed. Lateral CP angles are clear. IMPRESSION: Interval removal of right chest tube. Small right apical pneumothorax. Increased markings in the right lower lung fields may suggest crowding of bronchovascular structures due to poor  inspiration or atelectasis/pneumonia. Electronically Signed   By: Elmer Picker M.D.   On: 07/29/2021 11:30   DG CHEST PORT 1 VIEW  Result Date: 07/29/2021 CLINICAL DATA:  Chest tube present, pneumothorax history of lobectomy EXAM: PORTABLE CHEST 1 VIEW COMPARISON:  Radiograph 07/28/2021 FINDINGS: Unchanged cardiomediastinal silhouette. Stable right apical chest tube. Unchanged right neck catheter sheath. There is no visible pneumothorax. No large effusion. Bibasilar atelectasis. No acute osseous abnormality. IMPRESSION: Stable right apical chest tube without visible pneumothorax. Bibasilar atelectasis Electronically Signed   By: Maurine Simmering M.D.   On: 07/29/2021 08:12   DG Chest Port 1 View  Result Date: 07/28/2021 CLINICAL DATA:  Status post lobectomy. EXAM: PORTABLE CHEST 1 VIEW COMPARISON:  July 27, 2021. FINDINGS: Stable cardiomediastinal silhouette. Stable right-sided chest tube is noted without definite pneumothorax. Minimal to mild bibasilar subsegmental atelectasis is noted. Bony thorax is unremarkable. IMPRESSION: Stable right-sided chest tube without pneumothorax. Minimal to mild bibasilar subsegmental atelectasis. Electronically Signed   By: Marijo Conception M.D.   On: 07/28/2021 07:43   DG Chest Port 1 View  Result Date: 07/27/2021 CLINICAL DATA:  Follow-up of pneumothorax EXAM: PORTABLE  CHEST 1 VIEW COMPARISON:  07/25/2021 FINDINGS: Right IJ Cordis sheath terminates in the high SVC or right brachiocephalic vein. Right-sided chest tube terminates over the apex of the right hemithorax. Mild cardiomegaly. No pleural fluid. Suspect a small, approximately 5% lateral right pneumothorax. Superiorly, this may extend more medially or this linear density may be attributed to the fourth posterior right rib. The previously described right upper lobe pulmonary nodule has been resected. Underlying pulmonary interstitial thickening likely relates to emphysema/chronic bronchitis. IMPRESSION:  Interval right upper lobe wedge resection. Right chest tube in place with suspicion of a 5% lateral right pneumothorax. Cardiomegaly without congestive failure. Electronically Signed   By: Abigail Miyamoto M.D.   On: 07/27/2021 14:48      Discharge Medications: Allergies as of 07/30/2021   No Known Allergies      Medication List     TAKE these medications    acetaminophen 500 MG tablet Commonly known as: TYLENOL Take 1-2 tablets (500-1,000 mg total) by mouth every 6 (six) hours as needed.   aspirin 325 MG EC tablet Take 325 mg by mouth daily.   cholecalciferol 25 MCG (1000 UNIT) tablet Commonly known as: VITAMIN D3 Take 1,000 Units by mouth daily.   dorzolamide-timolol 22.3-6.8 MG/ML ophthalmic solution Commonly known as: COSOPT Place 1 drop into both eyes 2 (two) times daily.   EYE HEALTH + LUTEIN PO Take 1 tablet by mouth daily.   furosemide 40 MG tablet Commonly known as: LASIX Take 40 mg by mouth daily.   hydrOXYzine 25 MG tablet Commonly known as: ATARAX Take 25 mg by mouth 4 (four) times daily as needed for itching.   latanoprost 0.005 % ophthalmic solution Commonly known as: XALATAN Place 1 drop into the right eye at bedtime.   nitroGLYCERIN 0.4 MG SL tablet Commonly known as: NITROSTAT Place 0.4 mg under the tongue every 5 (five) minutes as needed for chest pain.   PHENobarbital 32.4 MG tablet Commonly known as: LUMINAL Take 64.8 mg by mouth at bedtime.   phenytoin 100 MG ER capsule Commonly known as: DILANTIN Take 200 mg by mouth at bedtime.   traMADol 50 MG tablet Commonly known as: ULTRAM Take 1 tablet (50 mg total) by mouth every 6 (six) hours as needed (mild pain).        Follow Up Appointments:  Follow-up Information     [name], MD. Go on 08/15/2021.   Specialty: Cardiothoracic Surgery Why: PA/LAT CXR to be taken (at [facility] which is in the same building as Dr. Leonarda Salon office, on the ground floor) on 03/07 at  8:30 am;Appointment time is at 9:00 am Contact information: [place] [address] [phone]                 Signed: Ellamae Sia 07/30/2021, 10:01 AM

## 2021-07-27 NOTE — Anesthesia Postprocedure Evaluation (Signed)
Anesthesia Post Note  Patient: Alan Melendez  Procedure(s) Performed: XI ROBOTIC ASSISTED THORASCOPY-RIGHT UPPER LOBE  WEDGE RESECTION, RIGHT UPPER LOBECTOMY (Right: Chest) INTERCOSTAL NERVE BLOCK (Right: Chest) LYMPH NODE DISSECTION (Right: Chest)     Patient location during evaluation: PACU Anesthesia Type: General Level of consciousness: sedated and patient cooperative Pain management: pain level controlled Vital Signs Assessment: post-procedure vital signs reviewed and stable Respiratory status: spontaneous breathing Cardiovascular status: stable Anesthetic complications: no   No notable events documented.  Last Vitals:  Vitals:   07/27/21 1649 07/27/21 1704  BP: 140/72 (!) 146/72  Pulse: 68 72  Resp: 14 13  Temp:    SpO2: 97% 98%    Last Pain:  Vitals:   07/27/21 0807  PainSc: 0-No pain                 Nolon Nations

## 2021-07-28 ENCOUNTER — Encounter (HOSPITAL_COMMUNITY): Payer: Self-pay | Admitting: Thoracic Surgery (Cardiothoracic Vascular Surgery)

## 2021-07-28 ENCOUNTER — Inpatient Hospital Stay (HOSPITAL_COMMUNITY): Payer: Medicare Other

## 2021-07-28 LAB — CBC
HCT: 34.4 % — ABNORMAL LOW (ref 39.0–52.0)
Hemoglobin: 11.1 g/dL — ABNORMAL LOW (ref 13.0–17.0)
MCH: 29.7 pg (ref 26.0–34.0)
MCHC: 32.3 g/dL (ref 30.0–36.0)
MCV: 92 fL (ref 80.0–100.0)
Platelets: 173 10*3/uL (ref 150–400)
RBC: 3.74 MIL/uL — ABNORMAL LOW (ref 4.22–5.81)
RDW: 13.4 % (ref 11.5–15.5)
WBC: 8.8 10*3/uL (ref 4.0–10.5)
nRBC: 0 % (ref 0.0–0.2)

## 2021-07-28 LAB — BASIC METABOLIC PANEL
Anion gap: 7 (ref 5–15)
BUN: 8 mg/dL (ref 8–23)
CO2: 26 mmol/L (ref 22–32)
Calcium: 8.2 mg/dL — ABNORMAL LOW (ref 8.9–10.3)
Chloride: 103 mmol/L (ref 98–111)
Creatinine, Ser: 0.79 mg/dL (ref 0.61–1.24)
GFR, Estimated: >60 mL/min (ref >60)
Glucose, Bld: 169 mg/dL — ABNORMAL HIGH (ref 70–99)
Potassium: 3.8 mmol/L (ref 3.5–5.1)
Sodium: 136 mmol/L (ref 135–145)

## 2021-07-28 MED ORDER — OXYCODONE HCL 5 MG PO TABS
5.0000 mg | ORAL_TABLET | ORAL | Status: DC | PRN
  Administered 2021-07-28 – 2021-07-29 (×3): 5 mg via ORAL
  Filled 2021-07-28 (×3): qty 1

## 2021-07-28 MED ORDER — HYDROXYZINE HCL 25 MG PO TABS: 25.0000 mg | ORAL_TABLET | Freq: Four times a day (QID) | ORAL | Status: DC | PRN

## 2021-07-28 MED ORDER — ASPIRIN EC 325 MG PO TBEC
325.0000 mg | DELAYED_RELEASE_TABLET | Freq: Every day | ORAL | Status: DC
  Administered 2021-07-28 – 2021-07-30 (×3): 325 mg via ORAL
  Filled 2021-07-28 (×3): qty 1

## 2021-07-28 MED ORDER — FUROSEMIDE 40 MG PO TABS
40.0000 mg | ORAL_TABLET | Freq: Every day | ORAL | Status: DC
  Administered 2021-07-28 – 2021-07-30 (×3): 40 mg via ORAL
  Filled 2021-07-28 (×3): qty 1

## 2021-07-28 MED ORDER — ORAL CARE MOUTH RINSE
15.0000 mL | Freq: Two times a day (BID) | OROMUCOSAL | Status: DC
  Administered 2021-07-28 (×2): 15 mL via OROMUCOSAL

## 2021-07-28 MED ORDER — POTASSIUM CHLORIDE CRYS ER 20 MEQ PO TBCR
20.0000 meq | EXTENDED_RELEASE_TABLET | Freq: Every day | ORAL | Status: DC
  Administered 2021-07-29 – 2021-07-30 (×2): 20 meq via ORAL
  Filled 2021-07-28 (×2): qty 1

## 2021-07-28 MED ORDER — POTASSIUM CHLORIDE CRYS ER 20 MEQ PO TBCR
20.0000 meq | EXTENDED_RELEASE_TABLET | Freq: Two times a day (BID) | ORAL | Status: AC
  Administered 2021-07-28 (×2): 20 meq via ORAL
  Filled 2021-07-28 (×2): qty 1

## 2021-07-28 MED ORDER — NYSTATIN 100000 UNIT/GM EX POWD
Freq: Two times a day (BID) | CUTANEOUS | Status: DC
  Filled 2021-07-28: qty 15

## 2021-07-28 NOTE — Plan of Care (Signed)

## 2021-07-28 NOTE — Progress Notes (Signed)
Pt sates he has trouble urinating at home. He urinated in toilet, an unknown amount since foley removed this morning. Post void residual bladder scan done and 14 cc remaining in the bladder.

## 2021-07-28 NOTE — Addendum Note (Signed)
Addendum  created 07/28/21 1603 by Janene Harvey, CRNA   Flowsheet accepted

## 2021-07-28 NOTE — Progress Notes (Signed)
° °   °  [address]       [place] [address]             [phone]       C/o pain from tube C/o being "raw" in groins  BP 127/70    Pulse 71    Temp 98.5 F (36.9 C) (Oral)    Resp 19    Ht 6' (1.829 m)    Wt 108.9 kg    SpO2 94%    BMI 32.55 kg/m   Intake/Output Summary (Last 24 hours) at 07/28/2021 1635 Last data filed at 07/28/2021 1200 Gross per 24 hour  Intake 1403.83 ml  Output 1400 ml  Net 3.83 ml   Will give Nystatin powder for groin  Remo Lipps C. Roxan Hockey, MD [facility] [phone]

## 2021-07-28 NOTE — Progress Notes (Signed)
°  Transition of Care [facility]) Screening Note   Patient Details  Name: Alan Melendez Date of Birth: 01-Sep-1940   Transition of Care [facility]) CM/SW Contact:    Milas Gain, [place] Phone Number: 07/28/2021, 4:39 PM    Transition of Care Department [facility]) has reviewed patient and no TOC needs have been identified at this time. We will continue to monitor patient advancement through interdisciplinary progression rounds. If new patient transition needs arise, please place a TOC consult.

## 2021-07-28 NOTE — Progress Notes (Addendum)
[facility] DAILY ICU PROGRESS NOTE                   [address]            [place] [address]          [phone]   1 Day Post-Op Procedure(s) (LRB): XI ROBOTIC ASSISTED THORASCOPY-RIGHT UPPER LOBE  WEDGE RESECTION, RIGHT UPPER LOBECTOMY (Right) INTERCOSTAL NERVE BLOCK (Right) LYMPH NODE DISSECTION (Right)  Total Length of Stay:  LOS: 1 day   Subjective: Patient eating breakfast this am. When asked if he is having a lot of pain, he said no.  Objective: Vital signs in last 24 hours: Temp:  [97 F (36.1 C)-98.5 F (36.9 C)] 97.9 F (36.6 C) (02/17 0300) Pulse Rate:  [59-79] 67 (02/17 0600) Cardiac Rhythm: Normal sinus rhythm (02/17 0400) Resp:  [10-22] 17 (02/17 0600) BP: (105-156)/(55-128) 120/56 (02/17 0600) SpO2:  [92 %-100 %] 97 % (02/17 0600) Arterial Line BP: (177-192)/(65-73) 187/65 (02/16 1349) Weight:  [108.9 kg] 108.9 kg (02/16 0758)  Filed Weights   07/27/21 0758  Weight: 108.9 kg      Intake/Output from previous day: 02/16 0701 - 02/17 0700 In: 2978.8 [I.V.:2428.8; IV Piggyback:550] Out: 8099 [Urine:990; Blood:200; Chest Tube:515]  Intake/Output this shift: No intake/output data recorded.  Current Meds: Scheduled Meds:  acetaminophen  1,000 mg Oral Q6H   Or   acetaminophen (TYLENOL) oral liquid 160 mg/5 mL  1,000 mg Oral Q6H   aspirin  325 mg Oral Daily   bisacodyl  10 mg Oral Daily   Chlorhexidine Gluconate Cloth  6 each Topical Q0600   dorzolamide-timolol  1 drop Both Eyes BID   enoxaparin (LOVENOX) injection  40 mg Subcutaneous Q24H   ketorolac  15 mg Intravenous Q6H   latanoprost  1 drop Right Eye QHS   mouth rinse  15 mL Mouth Rinse BID   multivitamin  1 tablet Oral Daily   PHENobarbital  64.8 mg Oral QHS   phenytoin  200 mg Oral QHS   senna-docusate  1 tablet Oral QHS   Continuous Infusions:  0.9 % NaCl with KCl 20 mEq / L 100 mL/hr at 07/28/21 0600   PRN Meds:.fentaNYL (SUBLIMAZE) injection, ondansetron (ZOFRAN) IV,  traMADol  General appearance: alert, cooperative, and no distress Neurologic: intact Heart: RRR Lungs: Mostly clear Abdomen: Soft, obese, non tender, bowel sounds present Extremities: Trace LE edema Wounds: Clean and dry. Chest tube dressing with some bloody drainage on it. Chest tube: to water seal, tidling with cough but no true air leak  Lab Results: CBC: Recent Labs    07/25/21 1314 07/27/21 1009 07/28/21 0412  WBC 6.1  --  8.8  HGB 12.3* 11.6* 11.1*  HCT 37.1* 34.0* 34.4*  PLT 215  --  173   BMET:  Recent Labs    07/25/21 1314 07/27/21 1009 07/28/21 0412  NA 138 139 136  K 3.6 3.9 3.8  CL 105  --  103  CO2 24  --  26  GLUCOSE 104*  --  169*  BUN 13  --  8  CREATININE 0.79  --  0.79  CALCIUM 9.1  --  8.2*    CMET: Lab Results  Component Value Date   WBC 8.8 07/28/2021   HGB 11.1 (L) 07/28/2021   HCT 34.4 (L) 07/28/2021   PLT 173 07/28/2021   GLUCOSE 169 (H) 07/28/2021   ALT 11 07/25/2021   AST 12 (L) 07/25/2021   NA 136  07/28/2021   K 3.8 07/28/2021   CL 103 07/28/2021   CREATININE 0.79 07/28/2021   BUN 8 07/28/2021   CO2 26 07/28/2021   TSH 2.130 05/09/2021   PSA 5.5 (H) 12/15/2019   INR 1.1 07/25/2021      PT/INR:  Recent Labs    07/25/21 1314  LABPROT 13.9  INR 1.1   Radiology: [facility] Chest Port 1 View  Result Date: 07/27/2021 CLINICAL DATA:  Follow-up of pneumothorax EXAM: PORTABLE CHEST 1 VIEW COMPARISON:  07/25/2021 FINDINGS: Right IJ Cordis sheath terminates in the high SVC or right brachiocephalic vein. Right-sided chest tube terminates over the apex of the right hemithorax. Mild cardiomegaly. No pleural fluid. Suspect a small, approximately 5% lateral right pneumothorax. Superiorly, this may extend more medially or this linear density may be attributed to the fourth posterior right rib. The previously described right upper lobe pulmonary nodule has been resected. Underlying pulmonary interstitial thickening likely relates to  emphysema/chronic bronchitis. IMPRESSION: Interval right upper lobe wedge resection. Right chest tube in place with suspicion of a 5% lateral right pneumothorax. Cardiomegaly without congestive failure. Electronically Signed   By: Abigail Miyamoto M.D.   On: 07/27/2021 14:48     Assessment/Plan: S/P Procedure(s) (LRB): XI ROBOTIC ASSISTED THORASCOPY-RIGHT UPPER LOBE  WEDGE RESECTION, RIGHT UPPER LOBECTOMY (Right) INTERCOSTAL NERVE BLOCK (Right) LYMPH NODE DISSECTION (Right) 1. CV - SR. 2.  Pulmonary - On 2 liters of oxygen via [place]. Chest tube with 515 cc of output since surgery. Chest tube is to water seal, tidling with cough but no air leak. CXR this am appears stable (trace right apical pneumothorax). Chest tube likely to remain secondary to output. Encourage incentive spirometer. Await final pathology. 3. Decrease IVF, remove foley 4. Expected post op blood loss anemia-H and H this am stable at 11.1 and 34.4 5. On Lovenox for DVT prophylaxis 6. Will restart oral Lasix as patient taken prior to surgery;supplement potassium 7. Ambulate tid  Nani Skillern PA-C 07/28/2021 7:20 AM   Patient seen and examined, agree with above I don't se any pneumo on CXR this AM, no air leak, but moderate drainage- leave tube on water sea today SCD + enoxaparin for DVT prophylaxis Mobilize  [place] C. Roxan Hockey, MD [facility] [phone]

## 2021-07-29 ENCOUNTER — Inpatient Hospital Stay (HOSPITAL_COMMUNITY): Payer: Medicare Other

## 2021-07-29 LAB — COMPREHENSIVE METABOLIC PANEL
ALT: 10 U/L (ref 0–44)
AST: 12 U/L — ABNORMAL LOW (ref 15–41)
Albumin: 2.8 g/dL — ABNORMAL LOW (ref 3.5–5.0)
Alkaline Phosphatase: 98 U/L (ref 38–126)
Anion gap: 7 (ref 5–15)
BUN: 12 mg/dL (ref 8–23)
CO2: 27 mmol/L (ref 22–32)
Calcium: 8.5 mg/dL — ABNORMAL LOW (ref 8.9–10.3)
Chloride: 104 mmol/L (ref 98–111)
Creatinine, Ser: 0.78 mg/dL (ref 0.61–1.24)
GFR, Estimated: >60 mL/min (ref >60)
Glucose, Bld: 122 mg/dL — ABNORMAL HIGH (ref 70–99)
Potassium: 3.9 mmol/L (ref 3.5–5.1)
Sodium: 138 mmol/L (ref 135–145)
Total Bilirubin: 0.4 mg/dL (ref 0.3–1.2)
Total Protein: 5.4 g/dL — ABNORMAL LOW (ref 6.5–8.1)

## 2021-07-29 LAB — CBC
HCT: 33.9 % — ABNORMAL LOW (ref 39.0–52.0)
Hemoglobin: 10.8 g/dL — ABNORMAL LOW (ref 13.0–17.0)
MCH: 29 pg (ref 26.0–34.0)
MCHC: 31.9 g/dL (ref 30.0–36.0)
MCV: 91.1 fL (ref 80.0–100.0)
Platelets: 179 10*3/uL (ref 150–400)
RBC: 3.72 MIL/uL — ABNORMAL LOW (ref 4.22–5.81)
RDW: 13.6 % (ref 11.5–15.5)
WBC: 9.7 10*3/uL (ref 4.0–10.5)
nRBC: 0 % (ref 0.0–0.2)

## 2021-07-29 NOTE — Progress Notes (Signed)
2 Days Post-Op Procedure(s) (LRB): XI ROBOTIC ASSISTED THORASCOPY-RIGHT UPPER LOBE  WEDGE RESECTION, RIGHT UPPER LOBECTOMY (Right) INTERCOSTAL NERVE BLOCK (Right) LYMPH NODE DISSECTION (Right) Subjective: Still some pain, better. Didn't rest well  Objective: Vital signs in last 24 hours: Temp:  [97.8 F (36.6 C)-98.2 F (36.8 C)] 97.9 F (36.6 C) (02/18 0755) Pulse Rate:  [64-85] 76 (02/18 0725) Cardiac Rhythm: Normal sinus rhythm (02/18 0400) Resp:  [12-32] 17 (02/18 0725) BP: (104-143)/(50-88) 113/69 (02/18 0725) SpO2:  [90 %-98 %] 94 % (02/18 0725) Weight:  [110 kg] 110 kg (02/18 0600)  Hemodynamic parameters for last 24 hours:    Intake/Output from previous day: 02/17 0701 - 02/18 0700 In: 724.8 [P.O.:475; I.V.:249.8] Out: 445 [Urine:175; Chest Tube:270] Intake/Output this shift: Total I/O In: -  Out: 80 [Chest Tube:80]  General appearance: alert, cooperative, and no distress Neurologic: intact Heart: regular rate and rhythm Lungs: diminished breath sounds right base No air leak  Lab Results: Recent Labs    07/28/21 0412 07/29/21 0355  WBC 8.8 9.7  HGB 11.1* 10.8*  HCT 34.4* 33.9*  PLT 173 179   BMET:  Recent Labs    07/28/21 0412 07/29/21 0355  NA 136 138  K 3.8 3.9  CL 103 104  CO2 26 27  GLUCOSE 169* 122*  BUN 8 12  CREATININE 0.79 0.78  CALCIUM 8.2* 8.5*    PT/INR: No results for input(s): LABPROT, INR in the last 72 hours. ABG    Component Value Date/Time   PHART 7.325 (L) 07/27/2021 1009   HCO3 28.0 07/27/2021 1009   TCO2 30 07/27/2021 1009   O2SAT 100 07/27/2021 1009   CBG (last 3)  Recent Labs    07/27/21 1946  GLUCAP 123*    Assessment/Plan: S/P Procedure(s) (LRB): XI ROBOTIC ASSISTED THORASCOPY-RIGHT UPPER LOBE  WEDGE RESECTION, RIGHT UPPER LOBECTOMY (Right) INTERCOSTAL NERVE BLOCK (Right) LYMPH NODE DISSECTION (Right) Awaiting transfer to [facility] Doing well No air leak and decreased drainage, CXR looks good- dc chest  tube Continue ambulation, SCD, enoxaparin Possibly home tomorrow    LOS: 2 days    [name] 07/29/2021

## 2021-07-30 ENCOUNTER — Inpatient Hospital Stay (HOSPITAL_COMMUNITY): Payer: Medicare Other

## 2021-07-30 MED ORDER — TRAMADOL HCL 50 MG PO TABS: 50.0000 mg | ORAL_TABLET | Freq: Four times a day (QID) | ORAL | 0 refills | Status: DC | PRN

## 2021-07-30 MED ORDER — ACETAMINOPHEN 500 MG PO TABS: 500.0000 mg | ORAL_TABLET | Freq: Four times a day (QID) | ORAL | 0 refills | Status: AC | PRN

## 2021-07-30 NOTE — Plan of Care (Signed)
°  Problem: Education: Goal: Knowledge of General Education information will improve Description: Including pain rating scale, medication(s)/side effects and non-pharmacologic comfort measures Outcome: Progressing   Problem: Health Behavior/Discharge Planning: Goal: Ability to manage health-related needs will improve Outcome: Progressing   Problem: Clinical Measurements: Goal: Ability to maintain clinical measurements within normal limits will improve Outcome: Progressing Goal: Will remain free from infection Outcome: Progressing Goal: Diagnostic test results will improve Outcome: Progressing   Problem: Coping: Goal: Level of anxiety will decrease Outcome: Progressing   Problem: Safety: Goal: Ability to remain free from injury will improve Outcome: Progressing

## 2021-07-30 NOTE — Progress Notes (Signed)
3 Days Post-Op Procedure(s) (LRB): XI ROBOTIC ASSISTED THORASCOPY-RIGHT UPPER LOBE  WEDGE RESECTION, RIGHT UPPER LOBECTOMY (Right) INTERCOSTAL NERVE BLOCK (Right) LYMPH NODE DISSECTION (Right) Subjective: Unhappy. Didn't sleep well and doesn't like the food  Objective: Vital signs in last 24 hours: Temp:  [97.6 F (36.4 C)-98.4 F (36.9 C)] 97.9 F (36.6 C) (02/19 0812) Pulse Rate:  [67-90] 80 (02/19 0812) Cardiac Rhythm: Normal sinus rhythm (02/19 0700) Resp:  [14-21] 20 (02/19 0812) BP: (106-159)/(57-76) 133/76 (02/19 0812) SpO2:  [92 %-96 %] 96 % (02/19 0812)  Hemodynamic parameters for last 24 hours:    Intake/Output from previous day: 02/18 0701 - 02/19 0700 In: 776.2 [P.O.:600; I.V.:176.2] Out: 955 [Urine:875; Chest Tube:80] Intake/Output this shift: Total I/O In: 200 [P.O.:200] Out: -   General appearance: alert, cooperative, and no distress Neurologic: intact Heart: regular rate and rhythm Lungs: diminished breath sounds right base Wound: clean and dry  Lab Results: Recent Labs    07/28/21 0412 07/29/21 0355  WBC 8.8 9.7  HGB 11.1* 10.8*  HCT 34.4* 33.9*  PLT 173 179   BMET:  Recent Labs    07/28/21 0412 07/29/21 0355  NA 136 138  K 3.8 3.9  CL 103 104  CO2 26 27  GLUCOSE 169* 122*  BUN 8 12  CREATININE 0.79 0.78  CALCIUM 8.2* 8.5*    PT/INR: No results for input(s): LABPROT, INR in the last 72 hours. ABG    Component Value Date/Time   PHART 7.325 (L) 07/27/2021 1009   HCO3 28.0 07/27/2021 1009   TCO2 30 07/27/2021 1009   O2SAT 100 07/27/2021 1009   CBG (last 3)  Recent Labs    07/27/21 1946  GLUCAP 123*    Assessment/Plan: S/P Procedure(s) (LRB): XI ROBOTIC ASSISTED THORASCOPY-RIGHT UPPER LOBE  WEDGE RESECTION, RIGHT UPPER LOBECTOMY (Right) INTERCOSTAL NERVE BLOCK (Right) LYMPH NODE DISSECTION (Right) Plan for discharge: see discharge orders POD  # 3 Good O2 sat on room air Denies pain Ambulating Dc home later today    LOS: 3 days    [name] 07/30/2021

## 2021-07-30 NOTE — Progress Notes (Signed)
Patient provided with verbal discharge instructions. Paper copy of discharge provided to patient. Daughters at beside for d/c instructions. RN answered all questions. VSS at discharge. IV removed. Patient belongings sent with patient. Patient dc'd via wheelchair by NT to private vehicle.

## 2021-07-31 LAB — TYPE AND SCREEN
ABO/RH(D): O POS
Antibody Screen: NEGATIVE
Unit division: 0
Unit division: 0

## 2021-07-31 LAB — BPAM RBC
Blood Product Expiration Date: 202303182359
Blood Product Expiration Date: 202303182359
ISSUE DATE / TIME: 202302111110
ISSUE DATE / TIME: 202302111110
Unit Type and Rh: 5100
Unit Type and Rh: 5100

## 2021-07-31 LAB — SURGICAL PATHOLOGY

## 2021-08-08 DIAGNOSIS — I214 Non-ST elevation (NSTEMI) myocardial infarction: Secondary | ICD-10-CM | POA: Diagnosis not present

## 2021-08-08 DIAGNOSIS — I10 Essential (primary) hypertension: Secondary | ICD-10-CM | POA: Diagnosis not present

## 2021-08-10 ENCOUNTER — Other Ambulatory Visit: Payer: Self-pay | Admitting: *Deleted

## 2021-08-10 NOTE — Progress Notes (Signed)
The proposed treatment discussed in cancer conference is for discussion purpose only and is not a binding recommendation. The patient was not physically examined nor presented with their treatment options. Therefore, final treatment plans cannot be decided.  ?

## 2021-08-14 ENCOUNTER — Other Ambulatory Visit: Payer: Self-pay | Admitting: Thoracic Surgery (Cardiothoracic Vascular Surgery)

## 2021-08-14 DIAGNOSIS — R911 Solitary pulmonary nodule: Secondary | ICD-10-CM

## 2021-08-15 ENCOUNTER — Other Ambulatory Visit: Payer: Self-pay

## 2021-08-15 ENCOUNTER — Ambulatory Visit
Admission: RE | Admit: 2021-08-15 | Discharge: 2021-08-15 | Disposition: A | Discharge status: Home / Self Care | Payer: Medicare Other | Source: Ambulatory Visit | Attending: Thoracic Surgery (Cardiothoracic Vascular Surgery) | Admitting: Thoracic Surgery (Cardiothoracic Vascular Surgery)

## 2021-08-15 ENCOUNTER — Ambulatory Visit (INDEPENDENT_AMBULATORY_CARE_PROVIDER_SITE_OTHER): Payer: Self-pay | Admitting: Thoracic Surgery (Cardiothoracic Vascular Surgery)

## 2021-08-15 ENCOUNTER — Encounter: Payer: Self-pay | Admitting: Thoracic Surgery (Cardiothoracic Vascular Surgery)

## 2021-08-15 DIAGNOSIS — J90 Pleural effusion, not elsewhere classified: Secondary | ICD-10-CM | POA: Diagnosis not present

## 2021-08-15 DIAGNOSIS — R911 Solitary pulmonary nodule: Secondary | ICD-10-CM

## 2021-08-15 DIAGNOSIS — Z9889 Other specified postprocedural states: Secondary | ICD-10-CM

## 2021-08-15 MED ORDER — PROMETHAZINE HCL 12.5 MG PO TABS: 12.5000 mg | ORAL_TABLET | Freq: Four times a day (QID) | ORAL | 1 refills | Status: DC | PRN

## 2021-08-15 NOTE — Progress Notes (Signed)
? ?   ?[address] ?      [place] [address] ?            [phone]   ? ? ?HPI: Alan Melendez returns for a scheduled follow-up visit after right upper lobectomy ? ?Alan Melendez is a 81 year old man with a history of heavy tobacco abuse, COPD, CAD, MI, stents, colon cancer, glaucoma, epilepsy, and a right upper lobe lung nodule.  He has about 100-pack-year history of smoking.  He quit 3 years ago after having a heart attack. ? ?He was found to have a right upper lobe lung nodule on a CT done for surveillance of his colon cancer.  On PET/CT he had a 1.5 cm hypermetabolic spiculated nodule in the right upper lobe. ? ?I did a robotic right upper lobectomy on 07/27/2021.  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  He went home on postoperative day #3. ? ?His primary complaint today is nausea.  He says been having problems with that since he left the hospital.  He gives a very vague history about constipation.  He also complains of some pain along the right costal margin.  He had to take 2 Tylenol for that last night.  He is not taking any narcotics.  His daughter says he had a prescription for a nausea medication from Dr. Quillian Quince.  She thinks it was Zofran.  She is requesting Phenergan. ? ?Past Medical History:  ?Diagnosis Date  ? Anxiety   ? Cataracts, bilateral   ? Colon cancer ([place])   ? Depression   ? Epilepsy ([place])   ? Glaucoma   ? Heart attack ([place])   ? History of kidney stones   ? ? ?Current Outpatient Medications  ?Medication Sig Dispense Refill  ? acetaminophen (TYLENOL) 500 MG tablet Take 1-2 tablets (500-1,000 mg total) by mouth every 6 (six) hours as needed. 30 tablet 0  ? aspirin 325 MG EC tablet Take 325 mg by mouth daily.    ? cholecalciferol (VITAMIN D3) 25 MCG (1000 UNIT) tablet Take 1,000 Units by mouth daily.    ? dorzolamide-timolol (COSOPT) 22.3-6.8 MG/ML ophthalmic solution Place 1 drop into both eyes 2 (two) times daily.    ? furosemide (LASIX) 40 MG tablet Take 40 mg by  mouth daily.    ? hydrOXYzine (ATARAX) 25 MG tablet Take 25 mg by mouth 4 (four) times daily as needed for itching.    ? latanoprost (XALATAN) 0.005 % ophthalmic solution Place 1 drop into the right eye at bedtime.    ? Multiple Vitamins-Minerals (EYE HEALTH + LUTEIN PO) Take 1 tablet by mouth daily.    ? nitroGLYCERIN (NITROSTAT) 0.4 MG SL tablet Place 0.4 mg under the tongue every 5 (five) minutes as needed for chest pain.    ? PHENobarbital (LUMINAL) 32.4 MG tablet Take 64.8 mg by mouth at bedtime.    ? phenytoin (DILANTIN) 100 MG ER capsule Take 200 mg by mouth at bedtime.    ? promethazine (PHENERGAN) 12.5 MG tablet Take 1 tablet (12.5 mg total) by mouth every 6 (six) hours as needed for nausea or vomiting. 15 tablet 1  ? traMADol (ULTRAM) 50 MG tablet Take 1 tablet (50 mg total) by mouth every 6 (six) hours as needed (mild pain). 30 tablet 0  ? ?No current facility-administered medications for this visit.  ? ? ?Physical Exam ?BP 103/61 (BP Location: Left Arm, Patient Position: Sitting, Cuff Size: Large)   Pulse 72  Resp 20   Ht 6' (1.829 m)   Wt 235 lb 3.2 oz (106.7 kg)   SpO2 94% Comment: RA  BMI 31.75 kg/m?  ?81 year old man in no acute distress ?Alert and oriented x3 with no focal deficits ?Cardiac regular rate and rhythm ?Slight diminished breath sounds at right base but otherwise clear ?Incisions healing well ? ?Diagnostic Tests: ?I personally reviewed his chest x-ray.  Shows postoperative changes.  No concerning findings. ? ?Impression: ?Alan Melendez is a 81 year old male with a history of tobacco abuse and COPD who was found to have a right upper lobe lung nodule on CT done for screening for colon cancer metastases.  Over time that nodule increased in size.  It was hypermetabolic on PET/CT. ? ?I did a robotic right upper lobectomy on 07/27/2021.  His postoperative course was uncomplicated and he went home on postoperative day #3. ? ?He is having issues with nausea.  He is not on any new medications  other than the pain pills.  He is not really taking the pain medication he is just using Tylenol for that.  He did not get relief with Zofran.  I going to give him a low-dose of Phenergan to see if that is more effective.  I did caution he and his daughter that that can sometimes cause confusion in elderly patients and to keep a close eye on that. ? ?Postoperative pain as expected.  Controlled with Tylenol. ? ?He would like to see oncology at [facility] rather than [place].  We will schedule him to see Dr. Delton Coombes. ? ?Plan: ?Referral to Dr. Delton Coombes ?Phenergan as needed for nausea ?Return in 4 weeks with PA and lateral chest x-ray to check on progress ? ?[name], MD ?[facility] ?([phone] ? ? ? ?

## 2021-08-21 DIAGNOSIS — Z902 Acquired absence of lung [part of]: Secondary | ICD-10-CM | POA: Diagnosis not present

## 2021-08-21 DIAGNOSIS — D649 Anemia, unspecified: Secondary | ICD-10-CM | POA: Diagnosis not present

## 2021-08-21 DIAGNOSIS — D500 Iron deficiency anemia secondary to blood loss (chronic): Secondary | ICD-10-CM | POA: Diagnosis not present

## 2021-08-21 DIAGNOSIS — I252 Old myocardial infarction: Secondary | ICD-10-CM | POA: Diagnosis not present

## 2021-08-21 DIAGNOSIS — I4510 Unspecified right bundle-branch block: Secondary | ICD-10-CM | POA: Diagnosis not present

## 2021-08-21 DIAGNOSIS — I2510 Atherosclerotic heart disease of native coronary artery without angina pectoris: Secondary | ICD-10-CM | POA: Diagnosis not present

## 2021-08-21 DIAGNOSIS — M533 Sacrococcygeal disorders, not elsewhere classified: Secondary | ICD-10-CM | POA: Diagnosis not present

## 2021-08-21 DIAGNOSIS — K921 Melena: Secondary | ICD-10-CM | POA: Diagnosis not present

## 2021-08-21 DIAGNOSIS — Z20822 Contact with and (suspected) exposure to covid-19: Secondary | ICD-10-CM | POA: Diagnosis not present

## 2021-08-21 DIAGNOSIS — E7800 Pure hypercholesterolemia, unspecified: Secondary | ICD-10-CM | POA: Diagnosis not present

## 2021-08-21 DIAGNOSIS — R1084 Generalized abdominal pain: Secondary | ICD-10-CM | POA: Diagnosis not present

## 2021-08-21 DIAGNOSIS — Z7982 Long term (current) use of aspirin: Secondary | ICD-10-CM | POA: Diagnosis not present

## 2021-08-21 DIAGNOSIS — R110 Nausea: Secondary | ICD-10-CM | POA: Diagnosis not present

## 2021-08-21 DIAGNOSIS — K922 Gastrointestinal hemorrhage, unspecified: Secondary | ICD-10-CM | POA: Diagnosis not present

## 2021-08-21 DIAGNOSIS — R531 Weakness: Secondary | ICD-10-CM | POA: Diagnosis not present

## 2021-08-22 ENCOUNTER — Inpatient Hospital Stay (HOSPITAL_COMMUNITY)
Admission: AD | Admit: 2021-08-22 | Discharge: 2021-08-25 | DRG: 378 | Disposition: A | Discharge status: Home Health | Payer: Medicare Other | Source: Other Acute Inpatient Hospital | Attending: Internal Medicine | Admitting: Internal Medicine

## 2021-08-22 ENCOUNTER — Other Ambulatory Visit: Payer: Self-pay

## 2021-08-22 DIAGNOSIS — Z85038 Personal history of other malignant neoplasm of large intestine: Secondary | ICD-10-CM | POA: Diagnosis not present

## 2021-08-22 DIAGNOSIS — Z902 Acquired absence of lung [part of]: Secondary | ICD-10-CM

## 2021-08-22 DIAGNOSIS — E669 Obesity, unspecified: Secondary | ICD-10-CM | POA: Diagnosis present

## 2021-08-22 DIAGNOSIS — I252 Old myocardial infarction: Secondary | ICD-10-CM | POA: Diagnosis not present

## 2021-08-22 DIAGNOSIS — F32A Depression, unspecified: Secondary | ICD-10-CM | POA: Diagnosis not present

## 2021-08-22 DIAGNOSIS — K269 Duodenal ulcer, unspecified as acute or chronic, without hemorrhage or perforation: Secondary | ICD-10-CM | POA: Diagnosis not present

## 2021-08-22 DIAGNOSIS — E7800 Pure hypercholesterolemia, unspecified: Secondary | ICD-10-CM | POA: Diagnosis not present

## 2021-08-22 DIAGNOSIS — Z79899 Other long term (current) drug therapy: Secondary | ICD-10-CM

## 2021-08-22 DIAGNOSIS — H409 Unspecified glaucoma: Secondary | ICD-10-CM | POA: Diagnosis present

## 2021-08-22 DIAGNOSIS — Z9049 Acquired absence of other specified parts of digestive tract: Secondary | ICD-10-CM | POA: Diagnosis not present

## 2021-08-22 DIAGNOSIS — Z6831 Body mass index (BMI) 31.0-31.9, adult: Secondary | ICD-10-CM

## 2021-08-22 DIAGNOSIS — K922 Gastrointestinal hemorrhage, unspecified: Principal | ICD-10-CM | POA: Diagnosis present

## 2021-08-22 DIAGNOSIS — Z87891 Personal history of nicotine dependence: Secondary | ICD-10-CM

## 2021-08-22 DIAGNOSIS — K08109 Complete loss of teeth, unspecified cause, unspecified class: Secondary | ICD-10-CM | POA: Diagnosis present

## 2021-08-22 DIAGNOSIS — K2971 Gastritis, unspecified, with bleeding: Secondary | ICD-10-CM | POA: Diagnosis not present

## 2021-08-22 DIAGNOSIS — K921 Melena: Secondary | ICD-10-CM | POA: Diagnosis not present

## 2021-08-22 DIAGNOSIS — K5909 Other constipation: Secondary | ICD-10-CM | POA: Diagnosis present

## 2021-08-22 DIAGNOSIS — Z87442 Personal history of urinary calculi: Secondary | ICD-10-CM | POA: Diagnosis not present

## 2021-08-22 DIAGNOSIS — A048 Other specified bacterial intestinal infections: Secondary | ICD-10-CM | POA: Diagnosis not present

## 2021-08-22 DIAGNOSIS — K254 Chronic or unspecified gastric ulcer with hemorrhage: Secondary | ICD-10-CM | POA: Diagnosis present

## 2021-08-22 DIAGNOSIS — J449 Chronic obstructive pulmonary disease, unspecified: Secondary | ICD-10-CM | POA: Diagnosis not present

## 2021-08-22 DIAGNOSIS — G40909 Epilepsy, unspecified, not intractable, without status epilepticus: Secondary | ICD-10-CM | POA: Diagnosis not present

## 2021-08-22 DIAGNOSIS — Z955 Presence of coronary angioplasty implant and graft: Secondary | ICD-10-CM

## 2021-08-22 DIAGNOSIS — I2510 Atherosclerotic heart disease of native coronary artery without angina pectoris: Secondary | ICD-10-CM | POA: Diagnosis not present

## 2021-08-22 DIAGNOSIS — Z7982 Long term (current) use of aspirin: Secondary | ICD-10-CM

## 2021-08-22 DIAGNOSIS — Z85118 Personal history of other malignant neoplasm of bronchus and lung: Secondary | ICD-10-CM

## 2021-08-22 DIAGNOSIS — K259 Gastric ulcer, unspecified as acute or chronic, without hemorrhage or perforation: Secondary | ICD-10-CM | POA: Diagnosis not present

## 2021-08-22 DIAGNOSIS — K2970 Gastritis, unspecified, without bleeding: Secondary | ICD-10-CM | POA: Diagnosis not present

## 2021-08-22 DIAGNOSIS — Z20822 Contact with and (suspected) exposure to covid-19: Secondary | ICD-10-CM | POA: Diagnosis present

## 2021-08-22 DIAGNOSIS — K264 Chronic or unspecified duodenal ulcer with hemorrhage: Principal | ICD-10-CM | POA: Diagnosis present

## 2021-08-22 DIAGNOSIS — D500 Iron deficiency anemia secondary to blood loss (chronic): Secondary | ICD-10-CM | POA: Diagnosis not present

## 2021-08-22 DIAGNOSIS — K274 Chronic or unspecified peptic ulcer, site unspecified, with hemorrhage: Secondary | ICD-10-CM | POA: Diagnosis not present

## 2021-08-22 DIAGNOSIS — F419 Anxiety disorder, unspecified: Secondary | ICD-10-CM | POA: Diagnosis present

## 2021-08-22 DIAGNOSIS — R110 Nausea: Secondary | ICD-10-CM | POA: Diagnosis not present

## 2021-08-22 DIAGNOSIS — D62 Acute posthemorrhagic anemia: Secondary | ICD-10-CM | POA: Diagnosis not present

## 2021-08-22 DIAGNOSIS — D509 Iron deficiency anemia, unspecified: Secondary | ICD-10-CM | POA: Diagnosis not present

## 2021-08-22 LAB — CBC WITH DIFFERENTIAL/PLATELET
Abs Immature Granulocytes: 0.02 10*3/uL (ref 0.00–0.07)
Basophils Absolute: 0.1 10*3/uL (ref 0.0–0.1)
Basophils Relative: 1 %
Eosinophils Absolute: 0.3 10*3/uL (ref 0.0–0.5)
Eosinophils Relative: 4 %
HCT: 26.2 % — ABNORMAL LOW (ref 39.0–52.0)
Hemoglobin: 8.4 g/dL — ABNORMAL LOW (ref 13.0–17.0)
Immature Granulocytes: 0 %
Lymphocytes Relative: 14 %
Lymphs Abs: 1.2 10*3/uL (ref 0.7–4.0)
MCH: 28.2 pg (ref 26.0–34.0)
MCHC: 32.1 g/dL (ref 30.0–36.0)
MCV: 87.9 fL (ref 80.0–100.0)
Monocytes Absolute: 0.7 10*3/uL (ref 0.1–1.0)
Monocytes Relative: 8 %
Neutro Abs: 6.3 10*3/uL (ref 1.7–7.7)
Neutrophils Relative %: 73 %
Platelets: 259 10*3/uL (ref 150–400)
RBC: 2.98 MIL/uL — ABNORMAL LOW (ref 4.22–5.81)
RDW: 14.5 % (ref 11.5–15.5)
WBC: 8.5 10*3/uL (ref 4.0–10.5)
nRBC: 0.2 % (ref 0.0–0.2)

## 2021-08-22 MED ORDER — SALINE SPRAY 0.65 % NA SOLN
1.0000 | NASAL | Status: DC | PRN
  Administered 2021-08-22 – 2021-08-23 (×3): 1 via NASAL
  Filled 2021-08-22: qty 44

## 2021-08-22 MED ORDER — ACETAMINOPHEN 325 MG PO TABS: 650.0000 mg | ORAL_TABLET | Freq: Four times a day (QID) | ORAL | Status: DC | PRN

## 2021-08-22 MED ORDER — ACETAMINOPHEN 650 MG RE SUPP: 650.0000 mg | Freq: Four times a day (QID) | RECTAL | Status: DC | PRN

## 2021-08-22 MED ORDER — PANTOPRAZOLE SODIUM 40 MG IV SOLR: 40.0000 mg | Freq: Two times a day (BID) | INTRAVENOUS | Status: DC

## 2021-08-22 MED ORDER — PANTOPRAZOLE INFUSION (NEW) - SIMPLE MED
8.0000 mg/h | INTRAVENOUS | Status: DC
  Administered 2021-08-22 – 2021-08-25 (×7): 8 mg/h via INTRAVENOUS
  Filled 2021-08-22: qty 100
  Filled 2021-08-22 (×4): qty 80
  Filled 2021-08-22: qty 100
  Filled 2021-08-22: qty 80

## 2021-08-23 ENCOUNTER — Observation Stay (HOSPITAL_COMMUNITY): Payer: Medicare Other | Admitting: Anesthesiology

## 2021-08-23 ENCOUNTER — Encounter (HOSPITAL_COMMUNITY)
Admission: AD | Disposition: A | Discharge status: Home Health | Payer: Self-pay | Source: Other Acute Inpatient Hospital | Attending: Internal Medicine

## 2021-08-23 ENCOUNTER — Encounter (HOSPITAL_COMMUNITY): Payer: Self-pay | Admitting: Internal Medicine

## 2021-08-23 ENCOUNTER — Telehealth: Payer: Self-pay

## 2021-08-23 DIAGNOSIS — A048 Other specified bacterial intestinal infections: Secondary | ICD-10-CM | POA: Diagnosis not present

## 2021-08-23 DIAGNOSIS — G40909 Epilepsy, unspecified, not intractable, without status epilepticus: Secondary | ICD-10-CM | POA: Diagnosis present

## 2021-08-23 DIAGNOSIS — K922 Gastrointestinal hemorrhage, unspecified: Secondary | ICD-10-CM | POA: Diagnosis present

## 2021-08-23 DIAGNOSIS — Z85038 Personal history of other malignant neoplasm of large intestine: Secondary | ICD-10-CM | POA: Diagnosis not present

## 2021-08-23 DIAGNOSIS — K921 Melena: Secondary | ICD-10-CM | POA: Diagnosis not present

## 2021-08-23 DIAGNOSIS — K08109 Complete loss of teeth, unspecified cause, unspecified class: Secondary | ICD-10-CM | POA: Diagnosis present

## 2021-08-23 DIAGNOSIS — K274 Chronic or unspecified peptic ulcer, site unspecified, with hemorrhage: Secondary | ICD-10-CM

## 2021-08-23 DIAGNOSIS — Z7982 Long term (current) use of aspirin: Secondary | ICD-10-CM | POA: Diagnosis not present

## 2021-08-23 DIAGNOSIS — K259 Gastric ulcer, unspecified as acute or chronic, without hemorrhage or perforation: Secondary | ICD-10-CM | POA: Diagnosis not present

## 2021-08-23 DIAGNOSIS — D509 Iron deficiency anemia, unspecified: Secondary | ICD-10-CM | POA: Diagnosis not present

## 2021-08-23 DIAGNOSIS — K264 Chronic or unspecified duodenal ulcer with hemorrhage: Secondary | ICD-10-CM | POA: Diagnosis present

## 2021-08-23 DIAGNOSIS — K269 Duodenal ulcer, unspecified as acute or chronic, without hemorrhage or perforation: Secondary | ICD-10-CM | POA: Diagnosis not present

## 2021-08-23 DIAGNOSIS — Z87891 Personal history of nicotine dependence: Secondary | ICD-10-CM

## 2021-08-23 DIAGNOSIS — F419 Anxiety disorder, unspecified: Secondary | ICD-10-CM | POA: Diagnosis present

## 2021-08-23 DIAGNOSIS — J449 Chronic obstructive pulmonary disease, unspecified: Secondary | ICD-10-CM | POA: Diagnosis present

## 2021-08-23 DIAGNOSIS — I252 Old myocardial infarction: Secondary | ICD-10-CM | POA: Diagnosis not present

## 2021-08-23 DIAGNOSIS — Z955 Presence of coronary angioplasty implant and graft: Secondary | ICD-10-CM | POA: Diagnosis not present

## 2021-08-23 DIAGNOSIS — Z20822 Contact with and (suspected) exposure to covid-19: Secondary | ICD-10-CM | POA: Diagnosis present

## 2021-08-23 DIAGNOSIS — Z85118 Personal history of other malignant neoplasm of bronchus and lung: Secondary | ICD-10-CM | POA: Diagnosis not present

## 2021-08-23 DIAGNOSIS — K254 Chronic or unspecified gastric ulcer with hemorrhage: Secondary | ICD-10-CM | POA: Diagnosis present

## 2021-08-23 DIAGNOSIS — H409 Unspecified glaucoma: Secondary | ICD-10-CM | POA: Diagnosis present

## 2021-08-23 DIAGNOSIS — K5909 Other constipation: Secondary | ICD-10-CM | POA: Diagnosis present

## 2021-08-23 DIAGNOSIS — F32A Depression, unspecified: Secondary | ICD-10-CM | POA: Diagnosis present

## 2021-08-23 DIAGNOSIS — Z6831 Body mass index (BMI) 31.0-31.9, adult: Secondary | ICD-10-CM | POA: Diagnosis not present

## 2021-08-23 DIAGNOSIS — D62 Acute posthemorrhagic anemia: Secondary | ICD-10-CM | POA: Diagnosis present

## 2021-08-23 DIAGNOSIS — K2971 Gastritis, unspecified, with bleeding: Secondary | ICD-10-CM

## 2021-08-23 DIAGNOSIS — I2510 Atherosclerotic heart disease of native coronary artery without angina pectoris: Secondary | ICD-10-CM | POA: Diagnosis present

## 2021-08-23 DIAGNOSIS — E669 Obesity, unspecified: Secondary | ICD-10-CM | POA: Diagnosis present

## 2021-08-23 DIAGNOSIS — Z87442 Personal history of urinary calculi: Secondary | ICD-10-CM | POA: Diagnosis not present

## 2021-08-23 DIAGNOSIS — Z9049 Acquired absence of other specified parts of digestive tract: Secondary | ICD-10-CM | POA: Diagnosis not present

## 2021-08-23 DIAGNOSIS — K2970 Gastritis, unspecified, without bleeding: Secondary | ICD-10-CM | POA: Diagnosis present

## 2021-08-23 DIAGNOSIS — Z79899 Other long term (current) drug therapy: Secondary | ICD-10-CM | POA: Diagnosis not present

## 2021-08-23 LAB — COMPREHENSIVE METABOLIC PANEL
ALT: 15 U/L (ref 0–44)
ALT: 14 U/L (ref 0–44)
AST: 14 U/L — ABNORMAL LOW (ref 15–41)
AST: 14 U/L — ABNORMAL LOW (ref 15–41)
Albumin: 2.9 g/dL — ABNORMAL LOW (ref 3.5–5.0)
Albumin: 2.8 g/dL — ABNORMAL LOW (ref 3.5–5.0)
Alkaline Phosphatase: 115 U/L (ref 38–126)
Alkaline Phosphatase: 118 U/L (ref 38–126)
Anion gap: 9 (ref 5–15)
Anion gap: 9 (ref 5–15)
BUN: 11 mg/dL (ref 8–23)
BUN: 12 mg/dL (ref 8–23)
CO2: 22 mmol/L (ref 22–32)
CO2: 22 mmol/L (ref 22–32)
Calcium: 8.3 mg/dL — ABNORMAL LOW (ref 8.9–10.3)
Calcium: 8.4 mg/dL — ABNORMAL LOW (ref 8.9–10.3)
Chloride: 106 mmol/L (ref 98–111)
Chloride: 107 mmol/L (ref 98–111)
Creatinine, Ser: 0.69 mg/dL (ref 0.61–1.24)
Creatinine, Ser: 0.69 mg/dL (ref 0.61–1.24)
GFR, Estimated: >60 mL/min (ref >60)
GFR, Estimated: >60 mL/min (ref >60)
Glucose, Bld: 107 mg/dL — ABNORMAL HIGH (ref 70–99)
Glucose, Bld: 93 mg/dL (ref 70–99)
Potassium: 3.4 mmol/L — ABNORMAL LOW (ref 3.5–5.1)
Potassium: 3.4 mmol/L — ABNORMAL LOW (ref 3.5–5.1)
Sodium: 137 mmol/L (ref 135–145)
Sodium: 138 mmol/L (ref 135–145)
Total Bilirubin: 0.6 mg/dL (ref 0.3–1.2)
Total Bilirubin: 0.5 mg/dL (ref 0.3–1.2)
Total Protein: 5.8 g/dL — ABNORMAL LOW (ref 6.5–8.1)
Total Protein: 5.3 g/dL — ABNORMAL LOW (ref 6.5–8.1)

## 2021-08-23 LAB — CBC
HCT: 24.9 % — ABNORMAL LOW (ref 39.0–52.0)
Hemoglobin: 8.2 g/dL — ABNORMAL LOW (ref 13.0–17.0)
MCH: 28.5 pg (ref 26.0–34.0)
MCHC: 32.9 g/dL (ref 30.0–36.0)
MCV: 86.5 fL (ref 80.0–100.0)
Platelets: 240 10*3/uL (ref 150–400)
RBC: 2.88 MIL/uL — ABNORMAL LOW (ref 4.22–5.81)
RDW: 14.6 % (ref 11.5–15.5)
WBC: 7.1 10*3/uL (ref 4.0–10.5)
nRBC: 0 % (ref 0.0–0.2)

## 2021-08-23 LAB — CBC WITH DIFFERENTIAL/PLATELET
Abs Immature Granulocytes: 0.04 10*3/uL (ref 0.00–0.07)
Basophils Absolute: 0.1 10*3/uL (ref 0.0–0.1)
Basophils Relative: 1 %
Eosinophils Absolute: 0.4 10*3/uL (ref 0.0–0.5)
Eosinophils Relative: 5 %
HCT: 25.8 % — ABNORMAL LOW (ref 39.0–52.0)
Hemoglobin: 8.2 g/dL — ABNORMAL LOW (ref 13.0–17.0)
Immature Granulocytes: 1 %
Lymphocytes Relative: 14 %
Lymphs Abs: 1.2 10*3/uL (ref 0.7–4.0)
MCH: 28.5 pg (ref 26.0–34.0)
MCHC: 31.8 g/dL (ref 30.0–36.0)
MCV: 89.6 fL (ref 80.0–100.0)
Monocytes Absolute: 0.8 10*3/uL (ref 0.1–1.0)
Monocytes Relative: 9 %
Neutro Abs: 5.8 10*3/uL (ref 1.7–7.7)
Neutrophils Relative %: 70 %
Platelets: 248 10*3/uL (ref 150–400)
RBC: 2.88 MIL/uL — ABNORMAL LOW (ref 4.22–5.81)
RDW: 14.6 % (ref 11.5–15.5)
WBC: 8.3 10*3/uL (ref 4.0–10.5)
nRBC: 0 % (ref 0.0–0.2)

## 2021-08-23 LAB — RETICULOCYTES
Immature Retic Fract: 26.9 % — ABNORMAL HIGH (ref 2.3–15.9)
RBC.: 2.94 MIL/uL — ABNORMAL LOW (ref 4.22–5.81)
Retic Count, Absolute: 139 10*3/uL (ref 19.0–186.0)
Retic Ct Pct: 4.8 % — ABNORMAL HIGH (ref 0.4–3.1)

## 2021-08-23 LAB — HEMOGLOBIN AND HEMATOCRIT, BLOOD
HCT: 26.1 % — ABNORMAL LOW (ref 39.0–52.0)
Hemoglobin: 8.2 g/dL — ABNORMAL LOW (ref 13.0–17.0)

## 2021-08-23 LAB — IRON AND TIBC
Iron: 25 ug/dL — ABNORMAL LOW (ref 45–182)
Saturation Ratios: 8 % — ABNORMAL LOW (ref 17.9–39.5)
TIBC: 315 ug/dL (ref 250–450)
UIBC: 290 ug/dL

## 2021-08-23 LAB — FOLATE: Folate: 11.4 ng/mL (ref >5.9)

## 2021-08-23 LAB — RESP PANEL BY RT-PCR (FLU A&B, COVID) ARPGX2
Influenza A by PCR: NEGATIVE
Influenza B by PCR: NEGATIVE
SARS Coronavirus 2 by RT PCR: NEGATIVE

## 2021-08-23 LAB — TYPE AND SCREEN
ABO/RH(D): O POS
Antibody Screen: NEGATIVE

## 2021-08-23 LAB — FERRITIN: Ferritin: 38 ng/mL (ref 24–336)

## 2021-08-23 LAB — MAGNESIUM
Magnesium: 1.9 mg/dL (ref 1.7–2.4)
Magnesium: 2.1 mg/dL (ref 1.7–2.4)

## 2021-08-23 LAB — PROTIME-INR
INR: 1.2 (ref 0.8–1.2)
Prothrombin Time: 15.6 seconds — ABNORMAL HIGH (ref 11.4–15.2)

## 2021-08-23 SURGERY — ESOPHAGOGASTRODUODENOSCOPY (EGD) WITH PROPOFOL
Anesthesia: Monitor Anesthesia Care

## 2021-08-23 MED ORDER — ONDANSETRON HCL 4 MG/2ML IJ SOLN
4.0000 mg | Freq: Four times a day (QID) | INTRAMUSCULAR | Status: DC | PRN
Start: 2021-08-23 — End: 2021-08-25

## 2021-08-23 MED ORDER — LACTATED RINGERS IV SOLN: INTRAVENOUS | Status: DC

## 2021-08-23 MED ORDER — DORZOLAMIDE HCL-TIMOLOL MAL 2-0.5 % OP SOLN
1.0000 [drp] | Freq: Two times a day (BID) | OPHTHALMIC | Status: DC
  Administered 2021-08-23 – 2021-08-25 (×5): 1 [drp] via OPHTHALMIC
  Filled 2021-08-23: qty 10

## 2021-08-23 MED ORDER — PROPOFOL 10 MG/ML IV BOLUS
INTRAVENOUS | Status: DC | PRN
  Administered 2021-08-23: 40 mg via INTRAVENOUS

## 2021-08-23 MED ORDER — SODIUM CHLORIDE 0.9 % IV SOLN: INTRAVENOUS | Status: DC

## 2021-08-23 MED ORDER — LIDOCAINE 2% (20 MG/ML) 5 ML SYRINGE
INTRAMUSCULAR | Status: DC | PRN
  Administered 2021-08-23: 60 mg via INTRAVENOUS

## 2021-08-23 MED ORDER — PHENOBARBITAL 32.4 MG PO TABS
64.8000 mg | ORAL_TABLET | Freq: Every day | ORAL | Status: DC
  Administered 2021-08-23 – 2021-08-24 (×2): 64.8 mg via ORAL
  Filled 2021-08-23 (×2): qty 2

## 2021-08-23 MED ORDER — NALOXONE HCL 0.4 MG/ML IJ SOLN: 0.4000 mg | INTRAMUSCULAR | Status: DC | PRN

## 2021-08-23 MED ORDER — POTASSIUM CHLORIDE CRYS ER 20 MEQ PO TBCR
40.0000 meq | EXTENDED_RELEASE_TABLET | Freq: Once | ORAL | Status: AC
Start: 2021-08-23 — End: 2021-08-23
  Administered 2021-08-23: 40 meq via ORAL
  Filled 2021-08-23: qty 2

## 2021-08-23 MED ORDER — PROPOFOL 500 MG/50ML IV EMUL
INTRAVENOUS | Status: DC | PRN
  Administered 2021-08-23: 100 ug/kg/min via INTRAVENOUS

## 2021-08-23 MED ORDER — FENTANYL CITRATE PF 50 MCG/ML IJ SOSY: 25.0000 ug | PREFILLED_SYRINGE | INTRAMUSCULAR | Status: DC | PRN

## 2021-08-23 MED ORDER — PHENYTOIN SODIUM EXTENDED 100 MG PO CAPS
200.0000 mg | ORAL_CAPSULE | Freq: Every day | ORAL | Status: DC
  Administered 2021-08-23 – 2021-08-24 (×2): 200 mg via ORAL
  Filled 2021-08-23 (×3): qty 2

## 2021-08-23 MED ORDER — LATANOPROST 0.005 % OP SOLN
1.0000 [drp] | Freq: Every day | OPHTHALMIC | Status: DC
  Administered 2021-08-23 – 2021-08-24 (×2): 1 [drp] via OPHTHALMIC
  Filled 2021-08-23: qty 2.5

## 2021-08-23 SURGICAL SUPPLY — 15 items

## 2021-08-23 NOTE — Assessment & Plan Note (Signed)
#)   Acute Upper GI Bleed: diagnosis on the basis of finding of dark-colored stool on DRE there was fecal occult blood positive, with mild interval elevation in BUN, and  a/w evidence of acute blood loss anemia, with presenting Hgb noted to be 6.8 relative to most recent prior value of 10.8, as further detailed above.  ?  ?On full dose aspirin as an outpatient, but otherwise on no additional blood thinners at home. Denies NSAID use. No known history of known underlying liver disease, and denies any history of alcohol abuse or recent alcohol consumption.  Most recent colonoscopy appears to occurred in October 2022.  Unclear timing of prior EGD, although chart review reveals documentation of history of peptic ulcer disease. ?  ?In the absence of known liver disease, initiation of SBP prophylaxis does not appear to be warranted. Presentation and history are less suggestive of variceal bleed, and therefore there does not appear to be an indication for octreotide. Given suspected upper GI source, will continue Protonix drip. At this time ddx broadly includes, but is not limited to gastritis vs erosive esophagitis vs PUD (gastric versus duodenal distribution) vs Dieulafoy lesion vs AVM.  ?  ?Patient remained hemodynamically stable throughout his course at [facility], and continues to appear hemodynamically stable, arriving without tachycardia will maintaining normotensive blood pressures.  Asymptomatic at this time, including no chest pain or shortness of breath.  Of note, EDP at [facility] discussed patient's case with on-call gastroenterology, Dr. Collene Mares, Who has agreed to consult on arrival at [facility], with additional recommendations pending at this time. ?  ?We will check stat CBC, INR, type and screen.  ?  ?  ?  ?Plan: NPO. Refraining from pharmacologic DVT prophylaxis. Monitor on telemetry. Monitor continuous pulse-ox. Maintain at least 2 large bore IV's. Check INR. Q4H H&H's have been ordered through 9 AM on  08/23/2021. Will closely monitor these ensuing Hgb levels and correlate these data points with the patient's overall clinical picture including vital signs to determine need for subsequent transfusion.  Dr.**[facility] consulted, as above, with additional recs pending at this time.  Further evaluation and management of corresponding acute blood loss anemia, including adding-on iron studies to pre-transfusion specimen, as further detailed below. CMP in the AM. Protonix drip.  Stat CBC, as above.  Hold home aspirin. ?  ?  ?

## 2021-08-23 NOTE — Op Note (Signed)
[facility] ?Patient Name: Alan Melendez ?Procedure Date : 08/23/2021 ?MRN: [id number] ?Attending MD: Juanita Craver , MD ?Date of Birth: 08/26/1940 ?CSN: [id number] ?Age: 81 ?Admit Type: Inpatient ?Procedure:                EGD with antral biopsies. ?Indications:              Iron deficiency anemia, Melena, Epigastric pain. ?Providers:                Juanita Craver, MD, Doristine Johns, RN, [place]  ?                          [place], Technician, Aurelio Brash, CRNA ?Referring MD:             Maylon Peppers, MD, Revonda Standard. Roxan Hockey MD,  ?                          Mitzie Na Quillian Quince MD ?Medicines:                Monitored Anesthesia Care ?Complications:            No immediate complications. ?Estimated Blood Loss:     Estimated blood loss was minimal. ?Procedure:                Pre-Anesthesia Assessment: - Prior to the  ?                          procedure, a history and physical was performed,  ?                          and patient medications and allergies were  ?                          reviewed. The patient's tolerance of previous  ?                          anesthesia was also reviewed. The risks and  ?                          benefits of the procedure and the sedation options  ?                          and risks were discussed with the patient. All  ?                          questions were answered, and informed consent was  ?                          obtained. Prior Anticoagulants: The patient has  ?                          taken no previous anticoagulant or antiplatelet  ?                          agents except for aspirin. ASA Grade Assessment: IV  ?                          -  A patient with severe systemic disease that is a  ?                          constant threat to life. After reviewing the risks  ?                          and benefits, the patient was deemed in  ?                          satisfactory condition to undergo the procedure.  ?                          After obtaining  informed consent, the endoscope was  ?                          passed under direct vision. Throughout the  ?                          procedure, the patient's blood pressure, pulse, and  ?                          oxygen saturations were monitored continuously. The  ?                          GIF-H190 ([id number]) Olympus endoscope was introduced  ?                          through the mouth, and advanced to the second part  ?                          of duodenum. The EGD was accomplished without  ?                          difficulty. The patient tolerated the procedure  ?                          well. ?Scope In: ?Scope Out: ?Findings: ?     The examined esophagus and the GEJ appeared normal and widely patent;  ?     there was no evidenc eof esophagitis. ?     Two 7-8 mm non-bleeding cratered, clean based gastric ulcers-Forrest  ?     Classification Type III, were found in the gastric antrum-biopsies were  ?     done to rule out H. pylori by pathologytaken with a cold forceps for  ?     histology. ?     The cardia and gastric fundus were normal on retroflexion. ?     Patchy moderate inflammation characterized by erythema, friability and  ?     granularity was found in the entire examined stomach. ?     One large cratered, 1 cm, non-bleeding cratered duodenal ulcer with some  ?     heme adherent to the base, Forrest Classification Type II b-was found in  ?     the duodenal bulb posteriorly. ?     The second portion of the duodenum was normal. ?Impression:               -  Normal appearing, widely patent esophagus and GEJ. ?                          - Two clean based 7-8 mm non-bleeding gastric  ?                          ulcers in the gastric antrum-biopsies done for H.  ?                          pylori by pathology-Forrest Classifcation Type III. ?                          - Mild patchy gastritis noted throughout the  ?                          stomach. ?                          - One large 1 cm cratered ulcer in  the duodenal  ?                          bulb with pigmented material-Forrest Classification  ?                          Type IIb. ?                          -The pot-bulbar duodenum appeared normal. ?Moderate Sedation: ?     MAC used. ?Recommendation:           - Full liquid diet today. ?                          - Continue present medications. ?                          - Await pathology results. ?                          - Use a Proton Pump Inhibitor IV BID for a total of  ?                          72 hours and then Protonix 40 mg BID x 4 weeks.. ?                          - Return to primary GI's office, Dr. Quillian Quince  ?                          [place] in 2 weeks. ?                          - No Ibuprofen, Naproxen, or other non-steroidal  ?                          anti-inflammatory drugs for now. ?                          -  Colace 100 mg 2 PO BID for chronic constipation,  ?                          with Miralax as needed. ?Procedure Code(s):        --- Professional --- ?                          [phone], Esophagogastroduodenoscopy, flexible,  ?                          transoral; with biopsy, single or multiple ?Diagnosis Code(s):        --- Professional --- ?                          K92.1, Melena (includes Hematochezia) ?                          D50.9, Iron deficiency anemia, unspecified ?                          K26.9, Duodenal ulcer, unspecified as acute or  ?                          chronic, without hemorrhage or perforation ?                          K25.9, Gastric ulcer, unspecified as acute or  ?                          chronic, without hemorrhage or perforation ?                          K29.70, Gastritis, unspecified, without bleeding ?                          R10.13, Epigastric pain ?CPT copyright 2019 American Medical Association. All rights reserved. ?The codes documented in this report are preliminary and upon coder review may  ?be revised to meet current compliance requirements. ?Juanita Craver, MD ?Juanita Craver, MD ?08/23/2021 3:39:31 PM ?This report has been signed electronically. ?Number of Addenda: 0 ?

## 2021-08-23 NOTE — H&P (Signed)
?History and Physical  ? ? ?PLEASE NOTE THAT DRAGON DICTATION SOFTWARE WAS USED IN THE CONSTRUCTION OF THIS NOTE. ? ? ?Alan Melendez DOB: 28-Jan-1941 DOA: 08/22/2021 ? ?PCP: [name], MD  ?Patient coming from: home  ? ?I have personally briefly reviewed patient's old medical records in [place] ? ?Chief Complaint: Abdominal pain ? ?HPI: Alan Melendez is a 81 y.o. 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 ? ?The patient presented to [facility] and complaining of 2 days of generalized abdominal discomfort, sharp and nonradiating in nature.  Pain was intermittent over that timeframe, with exacerbation noted with palpation over the abdomen.  He also noted associated intermittent nausea, resulting in 2-3 episodes of nonbloody, nonbilious emesis over that timeframe.  Not associate with any diarrhea.  Continues to pass flatus.  Patient had not noticed any recent melena or hematochezia.  Denies any associated subjective fever, chills, rigors, or generalized myalgias.  No recent trauma or travel. ? ?Denies any associated chest pain, shortness of breath, palpitations, diaphoresis, dizziness, presyncope, or syncope. ? ?Denies any known history of underlying liver disease, nor any recent or recurrent alcohol consumption.  No regular use of NSAIDs.  He is on a full dose aspirin daily, but otherwise on no blood thinning agents as an outpatient. ? ?Per chart review, he has a history of peptic ulcer disease.  It appears that his most recent colonoscopy occurred in October 2022. ? ? ? ?[facility] ED course: ? ?Vital signs reportedly stable, with patient noted to be  nontachycardic and normotensive.  CBC reflected hemoglobin of 6.8 compared to most recent prior hemoglobin data point of 10.8 on 07/29/2021.  Labs are also notable for BUN of 19 compared to most recent prior value of 12 on 07/29/2021.  DRE reportedly revealed dark-colored stool, there was fecal occult blood positive.  Liver enzymes within normal limits.  Lipase 125. ? ?Imaging at [facility] notable for the following: Chest x-ray showed no evidence of acute cardiopulmonary process.  CT scanner was reportedly malfunctioning, and nonoperable during the patient's time at [facility].  Therefore, as an alternative to CT scan, patient underwent plain films of the abdomen which showed no evidence of bowel obstruction and no evidence of free air under the diaphragm. ? ?EDP discussed patient's case with the on-call gastroenterologist, Dr. Collene Mares, Who recommended transfer to [facility] for further evaluation management of suspected acute upper GI bleed,and will plan to consult following patient's arrival.  Protonix drip initiated.  He received transfusion of 1 unit PRBC while at [facility] ED.  ? ? ?Upon arrival at [facility], no residual nausea at this time.  Continues to deny any associated chest pain or shortness of breath.  Interval improvement in abdominal discomfort.  ? ? ? ?Review of Systems: As per HPI otherwise 10 point review of systems negative.  ? ?Past Medical History:  ?Diagnosis Date  ? Anxiety   ? Cataracts, bilateral   ? Colon cancer ([place])   ? Depression   ? Epilepsy ([place])   ? Glaucoma   ? Heart attack ([place])   ? History of kidney stones   ? ? ?Past Surgical History:  ?Procedure Laterality Date  ?  APPENDECTOMY    ? CHOLECYSTECTOMY    ? COLONOSCOPY WITH PROPOFOL N/A 03/21/2021  ? Procedure: COLONOSCOPY WITH PROPOFOL;  Surgeon: Harvel Quale, MD;  [facility];  Service: Gastroenterology;  Laterality: N/A;  1:45, pt knows to arrive at 9:30  ? coloonscopy    ? CORONARY ANGIOPLASTY    ?  HEMOSTASIS CLIP PLACEMENT  03/21/2021  ? Procedure: HEMOSTASIS CLIP PLACEMENT;  Surgeon: Harvel Quale, MD;  [facility];  Service: Gastroenterology;;  ? INTERCOSTAL NERVE BLOCK Right 07/27/2021  ? Procedure: INTERCOSTAL NERVE BLOCK;  Surgeon: [name], MD;  Location: [place];  Service: Thoracic;  Laterality: Right;  ? LYMPH NODE DISSECTION Right 07/27/2021  ? Procedure: LYMPH NODE DISSECTION;  Surgeon: [name], MD;  Location: [place];  Service: Thoracic;  Laterality: Right;  ? POLYPECTOMY  03/21/2021  ? Procedure: POLYPECTOMY;  Surgeon: Harvel Quale, MD;  [facility];  Service: Gastroenterology;;  ? SUBMUCOSAL TATTOO INJECTION  03/21/2021  ? Procedure: SUBMUCOSAL TATTOO INJECTION;  Surgeon: Harvel Quale, MD;  [facility];  Service: Gastroenterology;;  ? ? ?Social History: ? reports that he quit smoking about 2 years ago. His smoking use included cigarettes. He has a 100.00 pack-year smoking history. He has never used smokeless tobacco. He reports that he does not currently use alcohol. He reports that he does not use drugs. ? ? ?No Known Allergies ? ?Family History  ?Problem Relation Age of Onset  ? Diabetes Father   ? Diabetes Sister   ? ? ?Family history reviewed and not pertinent  ? ? ?Prior to Admission medications   ?Medication Sig Start Date End Date Taking? Authorizing Provider  ?acetaminophen (TYLENOL) 500 MG tablet Take 1-2 tablets (500-1,000 mg total) by mouth every 6 (six) hours as needed. 07/30/21   Barrett, Erin R, PA-C  ?aspirin 325 MG EC tablet Take 325 mg by mouth daily. 06/07/19   [provider]  ?cholecalciferol (VITAMIN D3) 25 MCG (1000 UNIT) tablet Take 1,000 Units by mouth daily.    [provider]  ?dorzolamide-timolol (COSOPT) 22.3-6.8 MG/ML ophthalmic solution Place 1 drop into both eyes 2 (two) times daily. 11/06/19   [provider]  ?furosemide (LASIX) 40 MG tablet Take  40 mg by mouth daily.    [provider]  ?hydrOXYzine (ATARAX) 25 MG tablet Take 25 mg by mouth 4 (four) times daily as needed for itching. 03/20/21   [provider]  ?latanoprost (XALATAN) 0.005 % ophthalmic solution Place 1 drop into the right eye at bedtime. 11/05/19   [provider]  ?Multiple Vitamins-Minerals (EYE HEALTH + LUTEIN PO) Take 1 tablet by mouth daily.    [provider]  ?nitroGLYCERIN (NITROSTAT) 0.4 MG SL tablet Place 0.4 mg under the tongue every 5 (five) minutes as needed for chest pain. 05/07/19   [provider]  ?PHENobarbital (LUMINAL) 32.4 MG tablet Take 64.8 mg by mouth at bedtime. 04/08/19   [provider]  ?phenytoin (DILANTIN) 100 MG ER capsule Take 200 mg by mouth at bedtime. 04/03/19   [provider]  ?promethazine (PHENERGAN) 12.5 MG tablet Take 1 tablet (12.5 mg total) by mouth every 6 (six) hours as needed for nausea or vomiting. 08/15/21   [name], MD  ?traMADol (ULTRAM) 50 MG tablet Take 1 tablet (50 mg total) by mouth every 6 (six) hours as needed (mild pain). 07/30/21   Barrett, Lodema Hong, PA-C  ? ? ? ?  Objective  ? ? ?Physical Exam: ?Vitals:  ? 08/22/21 2106 08/22/21 2200 08/22/21 2331 08/23/21 0400  ?BP: 125/64 125/64 123/70 123/70  ?Pulse:  70 72 74  ?Resp:  13 18 20   ?Temp: 98.4 ?F (36.9 ?C) 98.4 ?F (36.9 ?C) 98.3 ?F (36.8 ?C) 98.2 ?F (36.8 ?C)  ?TempSrc: Oral Oral Oral Oral  ?SpO2: 96% 96% 96% 96%  ?Weight: 106.6 kg 106.6 kg    ?Height: 6' (1.829 m) 6' (1.829 m)    ? ? ?General: appears to be stated age; alert, oriented ?Skin: warm, dry, no rash ?Head:  AT/[place] ?Mouth:  Oral mucosa membranes appear moist, normal dentition ?Neck: supple; trachea midline ?Heart:  RRR; did not appreciate any M/R/G ?Lungs: CTAB, did not appreciate any wheezes, rales, or rhonchi ?Abdomen: + BS; soft, ND, NT ?Vascular: 2+ pedal pulses b/l; 2+ radial pulses b/l ?Extremities: no peripheral edema, no muscle wasting ?Neuro:  strength and sensation intact in upper and lower extremities b/l ? ? ? ?Labs on Admission: I have personally reviewed following labs and imaging studies ? ?CBC: ?Recent Labs  ?Lab 08/22/21 ?2329  ?WBC 8.5  ?NEUTROABS 6.3  ?H

## 2021-08-23 NOTE — Anesthesia Postprocedure Evaluation (Signed)
Anesthesia Post Note ? ?Patient: Alan Melendez ? ?Procedure(s) Performed: ESOPHAGOGASTRODUODENOSCOPY (EGD) WITH PROPOFOL ?BIOPSY ? ?  ? ?Patient location during evaluation: PACU ?Anesthesia Type: MAC ?Level of consciousness: awake and alert ?Pain management: pain level controlled ?Vital Signs Assessment: post-procedure vital signs reviewed and stable ?Respiratory status: spontaneous breathing, nonlabored ventilation, respiratory function stable and patient connected to nasal cannula oxygen ?Cardiovascular status: stable and blood pressure returned to baseline ?Postop Assessment: no apparent nausea or vomiting ?Anesthetic complications: no ? ? ?No notable events documented. ? ?Last Vitals:  ?Vitals:  ? 08/23/21 1523 08/23/21 1533  ?BP: (!) 130/50 (!) 134/58  ?Pulse: 67 71  ?Resp: (!) 29 17  ?Temp:    ?SpO2: 93% 99%  ?  ?Last Pain:  ?Vitals:  ? 08/23/21 1533  ?TempSrc:   ?PainSc: 0-No pain  ? ? ?  ?  ?  ?  ?  ?  ? ?Shubh Chiara A. ? ? ? ? ?

## 2021-08-23 NOTE — Anesthesia Preprocedure Evaluation (Signed)
Anesthesia Evaluation  ?Patient identified by MRN, date of birth, ID band ?Patient awake ? ? ? ?Reviewed: ?Allergy & Precautions, NPO status , Patient's Chart, lab work & pertinent test results, reviewed documented beta blocker date and time  ? ?Airway ?Mallampati: II ? ?TM Distance: >3 FB ?Neck ROM: Full ? ? ? Dental ? ?(+) Edentulous Upper, Dental Advisory Given ?  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  ?Pulmonary exam normal ?breath sounds clear to auscultation ? ? ? ? ? ? Cardiovascular ?+ CAD, + Past MI, + Cardiac Stents and + DOE  ? ?Rhythm:Regular Rate:Normal ? ? ?  ?Neuro/Psych ?Seizures -, Well Controlled,  PSYCHIATRIC DISORDERS Anxiety Depression Glaucoma ?  ? GI/Hepatic ?Neg liver ROS, Melena ?  ?Endo/Other  ?Obesity ? Renal/GU ?negative Renal ROS  ?negative genitourinary ?  ?Musculoskeletal ?negative musculoskeletal ROS ?(+)  ? Abdominal ?(+) + obese,   ?Peds ? Hematology ?negative hematology ROS ?(+) Blood dyscrasia, anemia ,   ?Anesthesia Other Findings ? ? Reproductive/Obstetrics ? ?  ? ? ? ? ? ? ? ? ? ? ? ? ? ?  ?  ? ? ? ? ? ? ? ?                                  Anesthesia Evaluation  ?Patient identified by MRN, date of birth, ID band ?Patient awake ? ? ? ?Reviewed: ?Allergy & Precautions, NPO status , Patient's Chart, lab work & pertinent test results ? ?Airway ?Mallampati: II ? ?TM Distance: >3 FB ?Neck ROM: Full ? ? ? Dental ? ?(+) Dental Advisory Given, Upper Dentures, Partial Lower ?  ?Pulmonary ?former smoker,  ?  ?Pulmonary exam normal ?breath sounds clear to auscultation ? ? ? ? ? ? Cardiovascular ?Exercise Tolerance: Good ?+ Past MI and + Cardiac Stents  ?Normal cardiovascular exam ?Rhythm:Regular Rate:Normal ? ? ?  ?Neuro/Psych ?Seizures -, Well Controlled,  PSYCHIATRIC DISORDERS Anxiety Depression   ? GI/Hepatic ?negative GI ROS, Neg liver ROS,   ?Endo/Other  ?negative endocrine ROS ?  Renal/GU ?negative Renal ROS  ? ?  ?Musculoskeletal ?negative musculoskeletal ROS ?(+)  ? Abdominal ?  ?Peds ? Hematology ?negative hematology ROS ?(+)   ?Anesthesia Other Findings ? ? Reproductive/Obstetrics ?negative OB ROS ? ?  ? ? ? ? ? ? ? ? ? ? ? ? ? ?  ?  ? ? ? ? ? ? ? ?Anesthesia Physical ?Anesthesia Plan ? ?ASA: 3 ? ?Anesthesia Plan: General  ? ?Post-op Pain Management:   ? ?Induction: Intravenous ? ?PONV Risk Score and Plan: TIVA ? ?Airway Management Planned: Nasal Cannula and Natural Airway ? ?Additional Equipment:  ? ?Intra-op Plan:  ? ?Post-operative Plan:  ? ?Informed Consent: I have reviewed the patients History and Physical, chart, labs and discussed the procedure including the risks, benefits and alternatives for the proposed anesthesia with the patient or authorized representative who has indicated his/her understanding and acceptance.  ? ? ? ?Dental advisory given ? ?Plan Discussed with: CRNA and Surgeon ? ?Anesthesia Plan Comments:   ? ? ? ? ? ? ?Anesthesia Quick Evaluation ? ?Anesthesia Physical ? ?Anesthesia Plan ? ?ASA: 3 ? ?Anesthesia Plan: MAC  ? ?Post-op Pain Management:   ? ?Induction: Intravenous ? ?PONV Risk Score and Plan: 3 and Treatment may vary due to age or medical condition, Ondansetron and Dexamethasone ? ?Airway Management Planned:  Natural Airway ? ?Additional Equipment:  ? ?Intra-op Plan:  ? ?Post-operative Plan:  ? ?Informed Consent: I have reviewed the patients History and Physical, chart, labs and discussed the procedure including the risks, benefits and alternatives for the proposed anesthesia with the patient or authorized representative who has indicated his/her understanding and acceptance.  ? ? ? ?Dental advisory given ? ?Plan Discussed with: CRNA and Anesthesiologist ? ?Anesthesia Plan Comments: ( ? ?Preoperative cardiology evaluation by Assar, Soheil, DO following 07/11/21 stress test, "Stress test is abnormal with intermediate risk. No LAD territory ischemia (prior  stents) and mild non-obstructive disease of RCA on cath 2020. Patient has not been able to tolerate BB therapy and was previously non-compliant with DaPT. In light of this and the fact that he wants to proceed with surgery to get cancer out, we will hold off on heart cath. His surgical risk is at least moderate and his procedure should be done at a facility with IC/CTS backup." ? ?)  ? ? ? ? ? ? ?Anesthesia Quick Evaluation ? ?

## 2021-08-23 NOTE — Assessment & Plan Note (Signed)
#)   Acute blood loss anemia: in the setting of suspected acute upper GI bleed, presenting Hgb noted to be 6.8 relative to most recent prior value of 10.8 on 07/29/2021.  Received transfusion of 1 unit PRBC at [facility] .  Repeating stat CBC at this time as well as type and screen in case patient should require additional PRBC transfusion, although at this time he appears hemodynamically stable and asymptomatic. ?  ?  ?Plan: work-up and management for presenting suspected acute upper GI bleed, as above, including close monitoring of stat CBC followed by Q4H H&H's, with clinical evaluation for determination of need for blood transfusion, as further described above. Monitor on telemetry. Monitor continuous pulse-ox. NPO. Refraining from pharmacologic DVT prophylaxis. Check INR.   check follow-up total iron, TIBC, ferritin, MMA, folic acid level, reticulocyte count. Gastroenterology consulted, as above, with additional recs pending at this time. ?  ?

## 2021-08-23 NOTE — Telephone Encounter (Signed)
ATC patient. No VM available.  ?Called Tracie (ok per dpr)  ? ?Patient is currently at [facility] admitted as of yesterday and has been transferred to [facility]. She states he had appt with Dr. Worthy Keeler (oncology) tomorrow but patient cannot attend appt as he is hospitalized and has called to have appt rescheduled. Pts daughter states thyroid will be addressed at that appt  ? ? ? ?

## 2021-08-23 NOTE — Telephone Encounter (Signed)
-----   Message from Tanda Rockers, MD sent at 06/06/2021  5:24 PM EST ----- ?Call to see if has had thryoid w/u if not send to Endocrinology re positive PET scan  ? ?

## 2021-08-23 NOTE — Progress Notes (Signed)
?  Transition of Care (TOC) Screening Note ? ? ?Patient Details  ?Name: Alan Melendez ?Date of Birth: [birth year] ? ? ?Transition of Care (TOC) CM/SW Contact:    ?Cyndi Bender, RN ?Phone Number: ?08/23/2021, 8:28 AM ? ? ? ?Transition of Care Department [facility]) has reviewed patient and no TOC needs have been identified at this time. We will continue to monitor patient advancement through interdisciplinary progression rounds. If new patient transition needs arise, please place a TOC consult. ? ? ?

## 2021-08-23 NOTE — Transfer of Care (Signed)
Immediate Anesthesia Transfer of Care Note ? ?Patient: Alan Melendez ? ?Procedure(s) Performed: ESOPHAGOGASTRODUODENOSCOPY (EGD) WITH PROPOFOL ?BIOPSY ? ?Patient Location: PACU ? ?Anesthesia Type:MAC ? ?Level of Consciousness: awake and alert  ? ?Airway & Oxygen Therapy: Patient Spontanous Breathing and Patient connected to face mask oxygen ? ?Post-op Assessment: Report given to RN and Post -op Vital signs reviewed and stable ? ?Post vital signs: Reviewed and stable ? ?Last Vitals:  ?Vitals Value Taken Time  ?BP 127/50 08/23/21 1513  ?Temp    ?Pulse 70 08/23/21 1517  ?Resp 22 08/23/21 1517  ?SpO2 97 % 08/23/21 1517  ?Vitals shown include unvalidated device data. ? ?Last Pain:  ?Vitals:  ? 08/23/21 1513  ?TempSrc:   ?PainSc: 0-No pain  ?   ? ?  ? ?Complications: No notable events documented. ?

## 2021-08-23 NOTE — Consult Note (Signed)
UNASSIGNED PATIENT ?Reason for Consult: Melena with anemia. ?Referring Physician: [facility] hospital[facility]. ? ?Alan Melendez is an 81 y.o. male.  ?HPI: [name] is a 81 year old white male, with multiple medical problems listed below, was transferred to [facility] from [facility] with a 4-day history of epigastric pain and melena.  His hemoglobin yesterday was 6.8 g/dL. 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  His history is complicated by recent right upper lobectomy on 07/27/2021 by Dr. Roxan Hockey for right upper lobe nodule noted to be an adenocarcinoma on pathology. Patient gives a history of progressive weakness and dizziness but denies any syncope or near syncopal events..He received 1 unit of packed red blood cells prior to transfer to: Hospital. The patient's hemoglobin today is 8.2 g/dL with a platelet count of 48 K and iron levels are low at 25 with a ferritin of 38.  He also suffers from chronic constipation and has not had a bowel movement in several days. He uses MiraLAX off-and-on and has had significant problems abdominal bloating.  He denies having any nausea, vomiting or hematochezia his wife and daughter in the room are in the room with him and are helping with his history taking as he is somewhat hard of hearing and repetitive in his comments.  His last colonoscopy was done at [facility] when 3 small polyps were removed from the cecum and one large 25 mm polyp in the proximal transverse colon was removed piecemeal and clips were placed along with 3 small polyps in the sigmoid colon.  He was advised to have a follow-up colonoscopy in 6 months.  He uses an ibuprofen occasionally but denies any abuse of nonsteroidals.  He is on Aspirin, which he takes on a daily basis for his cardiac issues. ? ?Past Medical History:  ?Diagnosis Date  ? Anxiety   ? Cataracts, bilateral   ? Colon cancer ([place])   ?  Depression   ? Epilepsy ([place])   ? Glaucoma   ? Heart attack ([place])   ? History of kidney stones   ? ?Past Surgical History:  ?Procedure Laterality Date  ? APPENDECTOMY    ? CHOLECYSTECTOMY    ? COLONOSCOPY WITH PROPOFOL N/A 03/21/2021  ? Procedure: COLONOSCOPY WITH PROPOFOL;  Surgeon: Harvel Quale, MD;  [facility];  Service: Gastroenterology;  Laterality: N/A;  1:45, pt knows to arrive at 9:30  ? coloonscopy    ? CORONARY ANGIOPLASTY    ? HEMOSTASIS CLIP PLACEMENT  03/21/2021  ? Procedure: HEMOSTASIS CLIP PLACEMENT;  Surgeon: Harvel Quale, MD;  [facility];  Service: Gastroenterology;;  ? INTERCOSTAL NERVE BLOCK Right 07/27/2021  ? Procedure: INTERCOSTAL NERVE BLOCK;  Surgeon: [name], MD;  Location: [place];  Service: Thoracic;  Laterality: Right;  ? LYMPH NODE DISSECTION Right 07/27/2021  ? Procedure: LYMPH NODE DISSECTION;  Surgeon: [name], MD;  Location: [place];  Service: Thoracic;  Laterality: Right;  ? POLYPECTOMY  03/21/2021  ? Procedure: POLYPECTOMY;  Surgeon: Harvel Quale, MD;  [facility];  Service: Gastroenterology;;  ? SUBMUCOSAL TATTOO INJECTION  03/21/2021  ? Procedure: SUBMUCOSAL TATTOO INJECTION;  Surgeon: Harvel Quale, MD;  [facility];  Service: Gastroenterology;;  ? ?Family History  ?Problem Relation Age of Onset  ? Diabetes Father   ? Diabetes Sister   ? ?Social History:  reports that he quit  smoking about 2 years ago. His smoking use included cigarettes. He has a 100.00 pack-year smoking history. He has never used smokeless tobacco. He reports that he does not currently use alcohol. He reports that he does not use drugs. ? ?Allergies: No Known Allergies ? ?Medications: I have reviewed the patient's current medications. ?Prior to Admission:  ?Medications Prior to Admission  ?Medication Sig Dispense Refill Last Dose  ? acetaminophen (TYLENOL) 500 MG tablet Take 1-2 tablets  (500-1,000 mg total) by mouth every 6 (six) hours as needed. 30 tablet 0   ? aspirin 325 MG EC tablet Take 325 mg by mouth daily.     ? cholecalciferol (VITAMIN D3) 25 MCG (1000 UNIT) tablet Take 1,000 Units by mouth daily.     ? dorzolamide-timolol (COSOPT) 22.3-6.8 MG/ML ophthalmic solution Place 1 drop into both eyes 2 (two) times daily.     ? furosemide (LASIX) 40 MG tablet Take 40 mg by mouth daily.     ? hydrOXYzine (ATARAX) 25 MG tablet Take 25 mg by mouth 4 (four) times daily as needed for itching.     ? latanoprost (XALATAN) 0.005 % ophthalmic solution Place 1 drop into the right eye at bedtime.     ? Multiple Vitamins-Minerals (EYE HEALTH + LUTEIN PO) Take 1 tablet by mouth daily.     ? nitroGLYCERIN (NITROSTAT) 0.4 MG SL tablet Place 0.4 mg under the tongue every 5 (five) minutes as needed for chest pain.     ? PHENobarbital (LUMINAL) 32.4 MG tablet Take 64.8 mg by mouth at bedtime.     ? phenytoin (DILANTIN) 100 MG ER capsule Take 200 mg by mouth at bedtime.     ? promethazine (PHENERGAN) 12.5 MG tablet Take 1 tablet (12.5 mg total) by mouth every 6 (six) hours as needed for nausea or vomiting. 15 tablet 1   ? traMADol (ULTRAM) 50 MG tablet Take 1 tablet (50 mg total) by mouth every 6 (six) hours as needed (mild pain). 30 tablet 0   ? ?Scheduled: ? dorzolamide-timolol  1 drop Both Eyes BID  ? latanoprost  1 drop Right Eye QHS  ? [START ON 08/26/2021] pantoprazole  40 mg Intravenous Q12H  ? PHENobarbital  64.8 mg Oral QHS  ? phenytoin  200 mg Oral QHS  ? ?Continuous: ? pantoprazole 8 mg/hr (08/23/21 0427)  ? ?PPI:RJJOACZYSAYTK **OR** acetaminophen, fentaNYL (SUBLIMAZE) injection, naLOXone (NARCAN)  injection, ondansetron (ZOFRAN) IV, sodium chloride ? ?Results for orders placed or performed during the hospital encounter of 08/22/21 (from the past 48 hour(s))  ?Resp Panel by RT-PCR (Flu A&B, Covid) Nasopharyngeal Swab     Status: None  ? Collection Time: 08/22/21  9:32 PM  ? Specimen: Nasopharyngeal Swab;  Nasopharyngeal(NP) swabs in vial transport medium  ?Result Value Ref Range  ? SARS Coronavirus 2 by RT PCR NEGATIVE NEGATIVE  ?  Comment: (NOTE) ?SARS-CoV-2 target nucleic acids are NOT DETECTED. ? ?The SARS-CoV-2 RNA is generally detectable in upper respiratory ?specimens during the acute phase of infection. The lowest ?concentration of SARS-CoV-2 viral copies this assay can detect is ?138 copies/mL. A negative result does not preclude SARS-Cov-2 ?infection and should not be used as the sole basis for treatment or ?other patient management decisions. A negative result may occur with  ?improper specimen collection/handling, submission of specimen other ?than nasopharyngeal swab, presence of viral mutation(s) within the ?areas targeted by this assay, and inadequate number of viral ?copies(<138 copies/mL). A negative result must be combined with ?clinical observations, patient history,  and epidemiological ?information. The expected result is Negative. ? ?Fact Sheet for Patients:  ?EntrepreneurPulse.com.au ? ?Fact Sheet for Healthcare Providers:  ?IncredibleEmployment.be ? ?This test is no t yet approved or cleared by the [place] FDA and  ?has been authorized for detection and/or diagnosis of SARS-CoV-2 by ?FDA under an Emergency Use Authorization (EUA). This EUA will remain  ?in effect (meaning this test can be used) for the duration of the ?COVID-19 declaration under Section 564(b)(1) of the Act, 21 ?U.S.C.section 360bbb-3(b)(1), unless the authorization is terminated  ?or revoked sooner.  ? ? ?  ? Influenza A by PCR NEGATIVE NEGATIVE  ? Influenza B by PCR NEGATIVE NEGATIVE  ?  Comment: (NOTE) ?The Xpert Xpress SARS-CoV-2/FLU/RSV plus assay is intended as an aid ?in the diagnosis of influenza from Nasopharyngeal swab specimens and ?should not be used as a sole basis for treatment. Nasal washings and ?aspirates are unacceptable for Xpert Xpress  SARS-CoV-2/FLU/RSV ?testing. ? ?Fact Sheet for Patients: ?EntrepreneurPulse.com.au ? ?Fact Sheet for Healthcare Providers: ?IncredibleEmployment.be ? ?This test is not yet approved or cleared by the [facility]

## 2021-08-23 NOTE — Assessment & Plan Note (Signed)
?#)   Seizure disorder: Documented history of such.  On both phenytoin as well as phenobarbital as an outpatient, as confirmed per my discussions with our in-house pharmacist. ?  ?Plan: Continue outpatient antiepileptic regimen. ?  ?  ?

## 2021-08-23 NOTE — Progress Notes (Signed)
?      ?                 PROGRESS NOTE ? ?      ?PATIENT DETAILS ?Name: Alan Melendez ?Age: 81 y.o. ?Sex: male ?Date of Birth: 02-Jun-1941 ?Admit Date: 08/22/2021 ?Admitting Physician Rhetta Mura, DO ?QVZ:DGLOVF, Mitzie Na, MD ? ?Brief Summary: ?Patient is a 81 y.o.  male with a history of CAD, sigmoid adenocarcinoma-s/p resection in 4/14 2021, recent right upper lobectomy on 07/27/2021 for right upper lobe nodule (adenocarcinoma)-who presented with diffuse abdominal pain and melanotic stools.  Patient was initially evaluated at [facility] he was found to have a hemoglobin of 6.8.  He was given 1 unit of PRBC transfusion-and subsequently transferred to [facility]. ? ? ?Significant events: ?3/14>> transferred to [facility] from [place] [facility] bleeding with acute blood loss anemia. ? ?Significant studies: ? ? ?Significant microbiology data: ?3/14>> COVID/influenza PCR: Negative ? ?Procedures: ?None ? ?Consults: ?GI ? ?Subjective: ?Was sleeping when I woke him up this morning-denies any further melena since he has been here. ? ?Objective: ?Vitals: ?Blood pressure (!) 115/51, pulse 76, temperature 97.6 ?F (36.4 ?C), temperature source Oral, resp. rate 19, height 6' (1.829 m), weight 106.6 kg, SpO2 95 %.  ? ?Exam: ?Gen Exam:Alert awake-not in any distress ?HEENT:atraumatic, normocephalic ?Chest: B/L clear to auscultation anteriorly ?CVS:S1S2 regular ?Abdomen:soft non tender, non distended ?Extremities:no edema ?Neurology: Non focal ?Skin: no rash ? ?Pertinent Labs/Radiology: ?CBC Latest Ref Rng & Units 08/23/2021 08/23/2021 08/22/2021  ?WBC 4.0 - 10.5 K/uL - 8.3 8.5  ?Hemoglobin 13.0 - 17.0 g/dL 8.2(L) 8.2(L) 8.4(L)  ?Hematocrit 39.0 - 52.0 % 26.1(L) 25.8(L) 26.2(L)  ?Platelets 150 - 400 K/uL - 248 259  ?  ?Lab Results  ?Component Value Date  ? NA 138 08/23/2021  ? K 3.4 (L) 08/23/2021  ? CL 107 08/23/2021  ? CO2 22 08/23/2021  ?  ? ? ?Assessment/Plan: ?Upper GI bleeding with acute blood loss anemia: Continue  PPI-follow CBC closely and transfuse if significant drop in hemoglobin.  Await GI input. ? ?History of CAD-s/p remote PCI: No anginal symptoms-hold all antiplatelet agents. ? ?Seizure disorder: Continue phenobarbital and phenytoin. ? 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 ?History of right upper lung nodule-s/p robotic right upper lobectomy on 07/27/2021-adenocarcinoma on biopsy ? ?Obesity: ?Estimated body mass index is 31.87 kg/m? as calculated from the following: ?  Height as of this encounter: 6' (1.829 m). ?  Weight as of this encounter: 106.6 kg.  ? ?Code status: ?  Code Status: Full Code  ? ?DVT Prophylaxis: ?SCDs Start: 08/22/21 2133 ?  ?Family Communication: Daughter-Tracie Lysle Rubens [phone] updated over the phone on 3/15 ? ? ?Disposition Plan: ?Status is: Observation ?The patient will require care spanning > 2 midnights and should be moved to inpatient because: Upper GI bleeding with acute blood loss anemia-s/p PRBC transfusion-following CBC ?  ?Planned Discharge Destination:Home ? ? ?Diet: ?Diet Order   ? ?       ?  Diet NPO time specified  Diet effective midnight       ?  ? ?  ?  ? ?  ?  ? ? ?Antimicrobial agents: ?Anti-infectives (From admission, onward)  ? ? None  ? ?  ? ? ? ?MEDICATIONS: ?Scheduled Meds: ? dorzolamide-timolol  1 drop Both Eyes BID  ? latanoprost  1 drop Right Eye QHS  ? [START ON 08/26/2021] pantoprazole  40 mg Intravenous Q12H  ? PHENobarbital  64.8 mg Oral QHS  ? phenytoin  200 mg Oral QHS  ? ?Continuous Infusions: ? pantoprazole 8 mg/hr (08/23/21 0427)  ? ?PRN Meds:.acetaminophen **OR** acetaminophen, fentaNYL (SUBLIMAZE) injection, naLOXone (NARCAN)  injection, ondansetron (ZOFRAN) IV, sodium chloride ? ? ?I have personally reviewed following labs and imaging studies ? ?LABORATORY DATA: ?CBC: ?Recent Labs  ?Lab 08/22/21 ?2329 08/23/21 ?9833 08/23/21 ?8250  ?WBC 8.5 8.3  --   ?NEUTROABS 6.3 5.8  --   ?HGB 8.4* 8.2* 8.2*  ?HCT 26.2*  25.8* 26.1*  ?MCV 87.9 89.6  --   ?PLT 259 248  --   ? ? ?Basic Metabolic Panel: ?Recent Labs  ?Lab 08/22/21 ?2329 08/23/21 ?0318  ?NA 137 138  ?K 3.4* 3.4*  ?CL 106 107  ?CO2 22 22  ?GLUCOSE 107* 93  ?BUN 11 12  ?CREATININE 0.69 0.69  ?CALCIUM 8.3* 8.4*  ?MG 1.9 2.1  ? ? ?GFR: ?Estimated Creatinine Clearance: 96 mL/min (by C-G formula based on SCr of 0.69 mg/dL). ? ?Liver Function Tests: ?Recent Labs  ?Lab 08/22/21 ?2329 08/23/21 ?0318  ?AST 14* 14*  ?ALT 15 14  ?ALKPHOS 115 118  ?BILITOT 0.6 0.5  ?PROT 5.8* 5.3*  ?ALBUMIN 2.9* 2.8*  ? ?No results for input(s): LIPASE, AMYLASE in the last 168 hours. ?No results for input(s): AMMONIA in the last 168 hours. ? ?Coagulation Profile: ?Recent Labs  ?Lab 08/23/21 ?0318  ?INR 1.2  ? ? ?Cardiac Enzymes: ?No results for input(s): CKTOTAL, CKMB, CKMBINDEX, TROPONINI in the last 168 hours. ? ?BNP (last 3 results) ?No results for input(s): PROBNP in the last 8760 hours. ? ?Lipid Profile: ?No results for input(s): CHOL, HDL, LDLCALC, TRIG, CHOLHDL, LDLDIRECT in the last 72 hours. ? ?Thyroid Function Tests: ?No results for input(s): TSH, T4TOTAL, FREET4, T3FREE, THYROIDAB in the last 72 hours. ? ?Anemia Panel: ?Recent Labs  ?  08/23/21 ?0625  ?FOLATE 11.4  ?FERRITIN 38  ?TIBC 315  ?IRON 25*  ?RETICCTPCT 4.8*  ? ? ?Urine analysis: ?   ?Component Value Date/Time  ? COLORURINE YELLOW 07/25/2021 1400  ? APPEARANCEUR HAZY (A) 07/25/2021 1400  ? APPEARANCEUR Clear 02/16/2020 1101  ? LABSPEC 1.027 07/25/2021 1400  ? PHURINE 6.0 07/25/2021 1400  ? GLUCOSEU NEGATIVE 07/25/2021 1400  ? HGBUR NEGATIVE 07/25/2021 1400  ? [place] NEGATIVE 07/25/2021 1400  ? BILIRUBINUR Negative 02/16/2020 1101  ? KETONESUR 5 (A) 07/25/2021 1400  ? PROTEINUR 30 (A) 07/25/2021 1400  ? UROBILINOGEN 0.2 12/15/2019 1120  ? NITRITE NEGATIVE 07/25/2021 1400  ? LEUKOCYTESUR NEGATIVE 07/25/2021 1400  ? ? ?Sepsis Labs: ?Lactic Acid, Venous ?No results found for: LATICACIDVEN ? ?MICROBIOLOGY: ?Recent Results (from  the past 240 hour(s))  ?Resp Panel by RT-PCR (Flu A&B, Covid) Nasopharyngeal Swab     Status: None  ? Collection Time: 08/22/21  9:32 PM  ? Specimen: Nasopharyngeal Swab; Nasopharyngeal(NP) swabs in vial transport medium  ?Result Value Ref Range Status  ? SARS Coronavirus 2 by RT PCR NEGATIVE NEGATIVE Final  ?  Comment: (NOTE) ?SARS-CoV-2 target nucleic acids are NOT DETECTED. ? ?The SARS-CoV-2 RNA is generally detectable in upper respiratory ?specimens during the acute phase of infection. The lowest ?concentration of SARS-CoV-2 viral copies this assay can detect is ?138 copies/mL. A negative result does not preclude SARS-Cov-2 ?infection and should not be used as the sole basis for treatment or ?other patient management decisions. A negative result may occur with  ?improper specimen collection/handling, submission of specimen other ?than nasopharyngeal swab, presence of viral mutation(s) within the ?  areas targeted by this assay, and inadequate number of viral ?copies(<138 copies/mL). A negative result must be combined with ?clinical observations, patient history, and epidemiological ?information. The expected result is Negative. ? ?Fact Sheet for Patients:  ?EntrepreneurPulse.com.au ? ?Fact Sheet for Healthcare Providers:  ?IncredibleEmployment.be ? ?This test is no t yet approved or cleared by the [place] FDA and  ?has been authorized for detection and/or diagnosis of SARS-CoV-2 by ?FDA under an Emergency Use Authorization (EUA). This EUA will remain  ?in effect (meaning this test can be used) for the duration of the ?COVID-19 declaration under Section 564(b)(1) of the Act, 21 ?U.S.C.section 360bbb-3(b)(1), unless the authorization is terminated  ?or revoked sooner.  ? ? ?  ? Influenza A by PCR NEGATIVE NEGATIVE Final  ? Influenza B by PCR NEGATIVE NEGATIVE Final  ?  Comment: (NOTE) ?The Xpert Xpress SARS-CoV-2/FLU/RSV plus assay is intended as an aid ?in the diagnosis of  influenza from Nasopharyngeal swab specimens and ?should not be used as a sole basis for treatment. Nasal washings and ?aspirates are unacceptable for Xpert Xpress SARS-CoV-2/FLU/RSV ?testing. ? ?Fact Sheet for P

## 2021-08-24 ENCOUNTER — Encounter (HOSPITAL_COMMUNITY): Payer: Self-pay | Admitting: Gastroenterology

## 2021-08-24 ENCOUNTER — Ambulatory Visit (HOSPITAL_COMMUNITY): Payer: Medicare Other | Admitting: Hematology

## 2021-08-24 DIAGNOSIS — K922 Gastrointestinal hemorrhage, unspecified: Secondary | ICD-10-CM | POA: Diagnosis not present

## 2021-08-24 DIAGNOSIS — D62 Acute posthemorrhagic anemia: Secondary | ICD-10-CM | POA: Diagnosis not present

## 2021-08-24 DIAGNOSIS — G40909 Epilepsy, unspecified, not intractable, without status epilepticus: Secondary | ICD-10-CM | POA: Diagnosis not present

## 2021-08-24 LAB — CBC
HCT: 25.6 % — ABNORMAL LOW (ref 39.0–52.0)
Hemoglobin: 8.2 g/dL — ABNORMAL LOW (ref 13.0–17.0)
MCH: 28.3 pg (ref 26.0–34.0)
MCHC: 32 g/dL (ref 30.0–36.0)
MCV: 88.3 fL (ref 80.0–100.0)
Platelets: 222 10*3/uL (ref 150–400)
RBC: 2.9 MIL/uL — ABNORMAL LOW (ref 4.22–5.81)
RDW: 14.6 % (ref 11.5–15.5)
WBC: 7.9 10*3/uL (ref 4.0–10.5)
nRBC: 0 % (ref 0.0–0.2)

## 2021-08-24 LAB — BASIC METABOLIC PANEL
Anion gap: 9 (ref 5–15)
BUN: 11 mg/dL (ref 8–23)
CO2: 23 mmol/L (ref 22–32)
Calcium: 8.5 mg/dL — ABNORMAL LOW (ref 8.9–10.3)
Chloride: 105 mmol/L (ref 98–111)
Creatinine, Ser: 0.65 mg/dL (ref 0.61–1.24)
GFR, Estimated: >60 mL/min (ref >60)
Glucose, Bld: 103 mg/dL — ABNORMAL HIGH (ref 70–99)
Potassium: 3.7 mmol/L (ref 3.5–5.1)
Sodium: 137 mmol/L (ref 135–145)

## 2021-08-24 MED ORDER — DIPHENHYDRAMINE HCL 25 MG PO CAPS
25.0000 mg | ORAL_CAPSULE | Freq: Four times a day (QID) | ORAL | Status: DC | PRN
  Administered 2021-08-24 – 2021-08-25 (×3): 25 mg via ORAL
  Filled 2021-08-24 (×4): qty 1

## 2021-08-24 MED ORDER — DIPHENHYDRAMINE HCL 25 MG PO CAPS
25.0000 mg | ORAL_CAPSULE | Freq: Once | ORAL | Status: AC | PRN
  Administered 2021-08-24: 25 mg via ORAL
  Filled 2021-08-24: qty 1

## 2021-08-24 NOTE — Progress Notes (Signed)
?      ?                 PROGRESS NOTE ? ?      ?PATIENT DETAILS ?Name: Alan Melendez ?Age: 81 y.o. ?Sex: male ?Date of Birth: Jan 27, 1941 ?Admit Date: 08/22/2021 ?Admitting Physician Jonetta Osgood, MD ?JYN:WGNFAO, Mitzie Na, MD ? ?Brief Summary: ?Patient is a 80 y.o.  male with a history of CAD, sigmoid adenocarcinoma-s/p resection in 4/14 2021, recent right upper lobectomy on 07/27/2021 for right upper lobe nodule (adenocarcinoma)-who presented with diffuse abdominal pain and melanotic stools.  Patient was initially evaluated at [facility] he was found to have a hemoglobin of 6.8.  He was given 1 unit of PRBC transfusion-and subsequently transferred to [facility]. ? ? ?Significant events: ?3/14>> transferred to [facility] from [place] [facility] bleeding with acute blood loss anemia. ? ?Significant studies: ? ? ?Significant microbiology data: ?3/14>> COVID/influenza PCR: Negative ? ?Procedures: ?3/15>> EGD: 2 clean-based 7-8 mm nonbleeding gastric ulcers, one large 1 cm cratered ulcer in the duodenal bulb with pigmented material. ? ?Consults: ?GI ? ?Subjective: ?Refused blood work this morning-after I talked to him-he agreed.  Had melanotic stools x2 last evening.  ? ?Objective: ?Vitals: ?Blood pressure 113/72, pulse 69, temperature 98.5 ?F (36.9 ?C), temperature source Oral, resp. rate 19, height 6' (1.829 m), weight 105.5 kg, SpO2 95 %.  ? ?Exam: ?Gen Exam:Alert awake-not in any distress ?HEENT:atraumatic, normocephalic ?Chest: B/L clear to auscultation anteriorly ?CVS:S1S2 regular ?Abdomen:soft non tender, non distended ?Extremities:no edema ?Neurology: Non focal ?Skin: no rash  ? ?Pertinent Labs/Radiology: ?CBC Latest Ref Rng & Units 08/24/2021 08/23/2021 08/23/2021  ?WBC 4.0 - 10.5 K/uL 7.9 7.1 -  ?Hemoglobin 13.0 - 17.0 g/dL 8.2(L) 8.2(L) 8.2(L)  ?Hematocrit 39.0 - 52.0 % 25.6(L) 24.9(L) 26.1(L)  ?Platelets 150 - 400 K/uL 222 240 -  ?  ?Lab Results  ?Component Value Date  ? NA 137 08/24/2021  ? K 3.7 08/24/2021   ? CL 105 08/24/2021  ? CO2 23 08/24/2021  ?  ? ? ?Assessment/Plan: ?Upper GI bleeding with acute blood loss anemia: Some melanotic stools last night-however suspect this is old blood making its way out as hemoglobin is stable.  High risk findings for recurrent bleeding on EGD yesterday-hence will be on IV PPI infusion x72 hours.  Continue to follow CBC closely.  GI following.   ? ?History of CAD-s/p remote PCI: No anginal symptoms-hold all antiplatelet agents. ? ?Seizure disorder: Continue phenobarbital and phenytoin. ? 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 ?History of right upper lung nodule-s/p robotic right upper lobectomy on 07/27/2021-adenocarcinoma on biopsy ? ?Obesity: ?Estimated body mass index is 31.54 kg/m? as calculated from the following: ?  Height as of this encounter: 6' (1.829 m). ?  Weight as of this encounter: 105.5 kg.  ? ?Code status: ?  Code Status: Full Code  ? ?DVT Prophylaxis: ?SCDs Start: 08/22/21 2133 ?  ?Family Communication: Daughter-Tracie Lysle Rubens [phone] updated over the phone on 3/16 ? ? ?Disposition Plan: ?Status is: Observation ?The patient will require care spanning > 2 midnights and should be moved to inpatient because: Upper GI bleeding with acute blood loss anemia-s/p PRBC transfusion-following CBC ?  ?Planned Discharge Destination:Home ? ? ?Diet: ?Diet Order   ? ?       ?  Diet full liquid Room service appropriate? Yes with Assist; Fluid consistency: Thin  Diet effective now       ?  ? ?  ?  ? ?  ?  ? ? ?  Antimicrobial agents: ?Anti-infectives (From admission, onward)  ? ? None  ? ?  ? ? ? ?MEDICATIONS: ?Scheduled Meds: ? dorzolamide-timolol  1 drop Both Eyes BID  ? latanoprost  1 drop Right Eye QHS  ? [START ON 08/26/2021] pantoprazole  40 mg Intravenous Q12H  ? PHENobarbital  64.8 mg Oral QHS  ? phenytoin  200 mg Oral QHS  ? ?Continuous Infusions: ? pantoprazole 8 mg/hr (08/24/21 0314)  ? ?PRN Meds:.acetaminophen **OR**  acetaminophen, fentaNYL (SUBLIMAZE) injection, naLOXone (NARCAN)  injection, ondansetron (ZOFRAN) IV, sodium chloride ? ? ?I have personally reviewed following labs and imaging studies ? ?LABORATORY DATA: ?CBC: ?Recent Labs  ?Lab 08/22/21 ?2329 08/23/21 ?0932 08/23/21 ?3557 08/23/21 ?1338 08/24/21 ?3220  ?WBC 8.5 8.3  --  7.1 7.9  ?NEUTROABS 6.3 5.8  --   --   --   ?HGB 8.4* 8.2* 8.2* 8.2* 8.2*  ?HCT 26.2* 25.8* 26.1* 24.9* 25.6*  ?MCV 87.9 89.6  --  86.5 88.3  ?PLT 259 248  --  240 222  ? ? ? ?Basic Metabolic Panel: ?Recent Labs  ?Lab 08/22/21 ?2329 08/23/21 ?2542 08/24/21 ?7062  ?NA 137 138 137  ?K 3.4* 3.4* 3.7  ?CL 106 107 105  ?CO2 22 22 23   ?GLUCOSE 107* 93 103*  ?BUN 11 12 11   ?CREATININE 0.69 0.69 0.65  ?CALCIUM 8.3* 8.4* 8.5*  ?MG 1.9 2.1  --   ? ? ? ?GFR: ?Estimated Creatinine Clearance: 95.6 mL/min (by C-G formula based on SCr of 0.65 mg/dL). ? ?Liver Function Tests: ?Recent Labs  ?Lab 08/22/21 ?2329 08/23/21 ?0318  ?AST 14* 14*  ?ALT 15 14  ?ALKPHOS 115 118  ?BILITOT 0.6 0.5  ?PROT 5.8* 5.3*  ?ALBUMIN 2.9* 2.8*  ? ? ?No results for input(s): LIPASE, AMYLASE in the last 168 hours. ?No results for input(s): AMMONIA in the last 168 hours. ? ?Coagulation Profile: ?Recent Labs  ?Lab 08/23/21 ?0318  ?INR 1.2  ? ? ? ?Cardiac Enzymes: ?No results for input(s): CKTOTAL, CKMB, CKMBINDEX, TROPONINI in the last 168 hours. ? ?BNP (last 3 results) ?No results for input(s): PROBNP in the last 8760 hours. ? ?Lipid Profile: ?No results for input(s): CHOL, HDL, LDLCALC, TRIG, CHOLHDL, LDLDIRECT in the last 72 hours. ? ?Thyroid Function Tests: ?No results for input(s): TSH, T4TOTAL, FREET4, T3FREE, THYROIDAB in the last 72 hours. ? ?Anemia Panel: ?Recent Labs  ?  08/23/21 ?0625  ?FOLATE 11.4  ?FERRITIN 38  ?TIBC 315  ?IRON 25*  ?RETICCTPCT 4.8*  ? ? ? ?Urine analysis: ?   ?Component Value Date/Time  ? COLORURINE YELLOW 07/25/2021 1400  ? APPEARANCEUR HAZY (A) 07/25/2021 1400  ? APPEARANCEUR Clear 02/16/2020 1101  ? LABSPEC  1.027 07/25/2021 1400  ? PHURINE 6.0 07/25/2021 1400  ? GLUCOSEU NEGATIVE 07/25/2021 1400  ? HGBUR NEGATIVE 07/25/2021 1400  ? [place] NEGATIVE 07/25/2021 1400  ? BILIRUBINUR Negative 02/16/2020 1101  ? KETONESUR 5 (A) 07/25/2021 1400  ? PROTEINUR 30 (A) 07/25/2021 1400  ? UROBILINOGEN 0.2 12/15/2019 1120  ? NITRITE NEGATIVE 07/25/2021 1400  ? LEUKOCYTESUR NEGATIVE 07/25/2021 1400  ? ? ?Sepsis Labs: ?Lactic Acid, Venous ?No results found for: LATICACIDVEN ? ?MICROBIOLOGY: ?Recent Results (from the past 240 hour(s))  ?Resp Panel by RT-PCR (Flu A&B, Covid) Nasopharyngeal Swab     Status: None  ? Collection Time: 08/22/21  9:32 PM  ? Specimen: Nasopharyngeal Swab; Nasopharyngeal(NP) swabs in vial transport medium  ?Result Value Ref Range Status  ? SARS Coronavirus 2 by RT PCR NEGATIVE NEGATIVE Final  ?  Comment: (NOTE) ?SARS-CoV-2 target nucleic acids are NOT DETECTED. ? ?The SARS-CoV-2 RNA is generally detectable in upper respiratory ?specimens during the acute phase of infection. The lowest ?concentration of SARS-CoV-2 viral copies this assay can detect is ?138 copies/mL. A negative result does not preclude SARS-Cov-2 ?infection and should not be used as the sole basis for treatment or ?other patient management decisions. A negative result may occur with  ?improper specimen collection/handling, submission of specimen other ?than nasopharyngeal swab, presence of viral mutation(s) within the ?areas targeted by this assay, and inadequate number of viral ?copies(<138 copies/mL). A negative result must be combined with ?clinical observations, patient history, and epidemiological ?information. The expected result is Negative. ? ?Fact Sheet for Patients:  ?EntrepreneurPulse.com.au ? ?Fact Sheet for Healthcare Providers:  ?IncredibleEmployment.be ? ?This test is no t yet approved or cleared by the [place] FDA and  ?has been authorized for detection and/or diagnosis of SARS-CoV-2  by ?FDA under an Emergency Use Authorization (EUA). This EUA will remain  ?in effect (meaning this test can be used) for the duration of the ?COVID-19 declaration under Section 564(b)(1) of the Act, 21 ?U.

## 2021-08-24 NOTE — Progress Notes (Signed)
Notified Dr Marlowe Sax of c/o itching and unable to sleep. States he is allergic to the sheets. Requesting medication/cream for itching.  ?

## 2021-08-24 NOTE — Evaluation (Signed)
Physical Therapy Evaluation ?Patient Details ?Name: Alan Melendez ?MRN: [id number] ?DOB: Nov 20, 1940 ?Today's Date: 08/24/2021 ? ?History of Present Illness ? Pt is a 81 yo M presenting to [facility] 08/22/21 with abdominal pain and distention. Pt PMH includes R upper lobectomy (07/27/21), sigmoid adenocarcinoma s/p resection (2021), MI, COPD, CAD, colon CA, epilepsy, and cataracts.  ?Clinical Impression ? Patient presented to [facility] on 08/22/21 with a GI bleed. Pt's impairments include decreased strength, power, balance, activity tolerance, and knowledge of use of DME. These impairments are limiting his ability to safely and independently transfer, ambulate, and navigate stairs. Patient requires supervision to min guard A with all functional mobility. Pt had mild SOB following mobility, however SpO2 remained >90% on RA with activity. SPT recommending Home Health PT upon D/C due to mobility deficits. PT will continue to follow acutely to maximize pt's safety and independence with functional mobility. ?   ?   ? ?Recommendations for follow up therapy are one component of a multi-disciplinary discharge planning process, led by the attending physician.  Recommendations may be updated based on patient status, additional functional criteria and insurance authorization. ? ?Follow Up Recommendations Home health PT (pt may decline) ? ?  ?Assistance Recommended at Discharge Frequent or constant Supervision/Assistance  ?Patient can return home with the following ? A little help with walking and/or transfers;Assistance with cooking/housework;Direct supervision/assist for financial management;Direct supervision/assist for medications management;Assist for transportation;Help with stairs or ramp for entrance ? ?  ?Equipment Recommendations None recommended by PT  ?Recommendations for Other Services ?    ?  ?Functional Status Assessment Patient has had a recent decline in their functional status and demonstrates the ability to make significant  improvements in function in a reasonable and predictable amount of time.  ? ?  ?Precautions / Restrictions Precautions ?Precautions: Fall ?Restrictions ?Weight Bearing Restrictions: No  ? ?  ? ?Mobility ? Bed Mobility ?Overal bed mobility: Needs Assistance ?Bed Mobility: Supine to Sit ?  ?  ?Supine to sit: Min guard, HOB elevated ?  ?  ?General bed mobility comments: pt required min guard for sitting on EOB with heavy UE reliance on bed rails. Initially reaching out for SPT to help lift trunk, however redirected to see if he could manage without assist. ?  ? ?Transfers ?Overall transfer level: Needs assistance ?Equipment used: None ?Transfers: Sit to/from Stand ?Sit to Stand: Min guard ?  ?  ?  ?  ?  ?General transfer comment: Pt used BUE to push from EOB to stand. Pt required close min guard A for safety and to steady upon standing. Pt reaching out for IV pole with transfer. ?  ? ?Ambulation/Gait ?Ambulation/Gait assistance: Min guard ?Gait Distance (Feet): 150 Feet ?Assistive device: IV Pole ?Gait Pattern/deviations: Step-through pattern, Narrow base of support, Trunk flexed ?Gait velocity: normal ?Gait velocity interpretation: >2.62 ft/sec, indicative of community ambulatory ?  ?General Gait Details: pt required close min guard A with ambulation with increased sway but no overt LOB with DGI tasks. Pt initially holding IV pole with outreached arm with forward flexed posture. Pt self corrected IV pole proximity to trunk. ? ?Stairs ?  ?  ?  ?  ?  ? ?Wheelchair Mobility ?  ? ?Modified Rankin (Stroke Patients Only) ?  ? ?  ? ?Balance Overall balance assessment: Needs assistance ?Sitting-balance support: No upper extremity supported, Feet supported ?Sitting balance-Leahy Scale: Good ?Sitting balance - Comments: pt requiring supervision in sitting without assist ?  ?Standing balance support: Single extremity supported, Reliant  on assistive device for balance, During functional activity ?Standing balance-Leahy Scale:  Fair ?Standing balance comment: pt reliant on single UE on IV pole for assist with standing and ambulation. Pt had close min guard A with ambulation with no overt LOB, just increased sway with DGI tasks ?  ?  ?  ?  ?  ?  ?  ?  ?Standardized Balance Assessment ?Standardized Balance Assessment : Dynamic Gait Index ?  ?Dynamic Gait Index ?Level Surface: Mild Impairment ?Change in Gait Speed: Normal ?Gait with Horizontal Head Turns: Mild Impairment ?Gait with Vertical Head Turns: Mild Impairment ?Gait and Pivot Turn: Mild Impairment ?Step Over Obstacle: Mild Impairment ?Step Around Obstacles: Normal ?   ? ? ? ?Pertinent Vitals/Pain Pain Assessment ?Pain Assessment: Faces ?Faces Pain Scale: No hurt  ? ? ?Home Living Family/patient expects to be discharged to:: Private residence ?Living Arrangements: Alone ?Available Help at Discharge: Family;Available PRN/intermittently ?Type of Home: House ?Home Access: Level entry ?  ?  ?Alternate Level Stairs-Number of Steps: flight ?Home Layout: Two level ?Home Equipment: Kasandra Knudsen - single point ?Additional Comments: daughter lives in the area and comes on the weekends to visit  ?  ?Prior Function Prior Level of Function : Independent/Modified Independent;Driving ?  ?  ?  ?  ?  ?  ?Mobility Comments: uses no AD around the house ?ADLs Comments: bathes, dresses and feeds himself. Daughter that comes once a week helps with iADLs (laundry, cleaning, making the bed). ?  ? ? ?Hand Dominance  ? Dominant Hand: Right ? ?  ?Extremity/Trunk Assessment  ? Upper Extremity Assessment ?Upper Extremity Assessment: Overall WFL for tasks assessed ?  ? ?Lower Extremity Assessment ?Lower Extremity Assessment: Generalized weakness ?  ? ?Cervical / Trunk Assessment ?Cervical / Trunk Assessment: Normal  ?Communication  ? Communication: HOH  ?Cognition Arousal/Alertness: Awake/alert ?Behavior During Therapy: [facility] for tasks assessed/performed ?Overall Cognitive Status: Within Functional Limits for tasks  assessed ?  ?  ?  ?  ?  ?  ?  ?  ?  ?  ?  ?  ?  ?  ?  ?  ?  ?  ?  ? ?  ?General Comments General comments (skin integrity, edema, etc.): pt did have mild SOB following ambulation; required multimodal cues for pursed lip breathing due to [facility]. ? ?  ?Exercises    ? ?Assessment/Plan  ?  ?PT Assessment Patient needs continued PT services  ?PT Problem List Decreased balance;Decreased mobility;Decreased coordination;Decreased knowledge of use of DME;Decreased safety awareness;Decreased strength;Decreased activity tolerance ? ?   ?  ?PT Treatment Interventions DME instruction;Gait training;Stair training;Functional mobility training;Therapeutic activities;Therapeutic exercise;Balance training;Neuromuscular re-education;Patient/family education   ? ?PT Goals (Current goals can be found in the Care Plan section)  ?Acute Rehab PT Goals ?Patient Stated Goal: to go home ?PT Goal Formulation: With patient ?Time For Goal Achievement: 09/07/21 ?Potential to Achieve Goals: Good ? ?  ?Frequency Min 3X/week ?  ? ? ?Co-evaluation   ?  ?  ?  ?  ? ? ?  ?AM-PAC PT "6 Clicks" Mobility  ?Outcome Measure Help needed turning from your back to your side while in a flat bed without using bedrails?: None ?Help needed moving from lying on your back to sitting on the side of a flat bed without using bedrails?: A Little ?Help needed moving to and from a bed to a chair (including a wheelchair)?: A Little ?Help needed standing up from a chair using your arms (e.g., wheelchair or bedside chair)?: A  Little ?Help needed to walk in hospital room?: A Little ?Help needed climbing 3-5 steps with a railing? : A Lot ?6 Click Score: 18 ? ?  ?End of Session Equipment Utilized During Treatment: Gait belt ?Activity Tolerance: Patient tolerated treatment well ?Patient left: in chair;with call bell/phone within reach;with chair alarm set ?Nurse Communication: Mobility status ?PT Visit Diagnosis: Unsteadiness on feet (R26.81);Muscle weakness (generalized)  (M62.81);Other abnormalities of gait and mobility (R26.89) ?  ? ?Time: 5176-1607 ?PT Time Calculation (min) (ACUTE ONLY): 34 min ? ? ?Charges:     ?  ?  ?   ? ? ?Jonne Ply, SPT ? ?Jonne Ply ?08/24/2021, 4:31 PM ? ?

## 2021-08-25 ENCOUNTER — Other Ambulatory Visit (HOSPITAL_COMMUNITY): Payer: Self-pay

## 2021-08-25 DIAGNOSIS — G40909 Epilepsy, unspecified, not intractable, without status epilepticus: Secondary | ICD-10-CM | POA: Diagnosis not present

## 2021-08-25 DIAGNOSIS — K922 Gastrointestinal hemorrhage, unspecified: Secondary | ICD-10-CM | POA: Diagnosis not present

## 2021-08-25 DIAGNOSIS — D62 Acute posthemorrhagic anemia: Secondary | ICD-10-CM | POA: Diagnosis not present

## 2021-08-25 LAB — CBC
HCT: 25.4 % — ABNORMAL LOW (ref 39.0–52.0)
Hemoglobin: 8.1 g/dL — ABNORMAL LOW (ref 13.0–17.0)
MCH: 28.1 pg (ref 26.0–34.0)
MCHC: 31.9 g/dL (ref 30.0–36.0)
MCV: 88.2 fL (ref 80.0–100.0)
Platelets: 213 10*3/uL (ref 150–400)
RBC: 2.88 MIL/uL — ABNORMAL LOW (ref 4.22–5.81)
RDW: 14.6 % (ref 11.5–15.5)
WBC: 8.2 10*3/uL (ref 4.0–10.5)
nRBC: 0 % (ref 0.0–0.2)

## 2021-08-25 MED ORDER — FERROUS SULFATE 325 (65 FE) MG PO TABS
325.0000 mg | ORAL_TABLET | Freq: Two times a day (BID) | ORAL | 3 refills | Status: AC
  Filled 2021-08-25: qty 60, 30d supply, fill #0

## 2021-08-25 MED ORDER — PANTOPRAZOLE SODIUM 40 MG PO TBEC
DELAYED_RELEASE_TABLET | ORAL | 11 refills | Status: DC
  Filled 2021-08-25: qty 60, 30d supply, fill #0

## 2021-08-25 MED ORDER — ASPIRIN EC 81 MG PO TBEC: 81.0000 mg | DELAYED_RELEASE_TABLET | Freq: Every day | ORAL | 11 refills | Status: DC

## 2021-08-25 NOTE — Discharge Summary (Addendum)
? ?PATIENT DETAILS ?Name: Alan Melendez ?Age: 81 y.o. ?Sex: male ?Date of Birth: 03/08/1941 ?MRN: [id number]. ?Admitting Physician: Jonetta Osgood, MD ?XNA:[id number], Mitzie Na, MD ? ?Admit Date: 08/22/2021 ?Discharge date: 08/25/2021 ? ?Recommendations for Outpatient Follow-up:  ?Follow up with PCP in 1-2 weeks ?Please obtain CMP/CBC in one week ?Please follow up with your primary gastroenterologist in 1-2 weeks. ?EGD biopsy results pending-please follow. ? ?Admitted From:  ?Home ? ?Disposition: ?Home health ?  ?Discharge Condition: ?good ? ?CODE STATUS: ?  Code Status: Full Code  ? ?Diet recommendation:  ?Diet Order   ? ?       ?  Diet - low sodium heart healthy       ?  ?  DIET SOFT Room service appropriate? Yes; Fluid consistency: Thin  Diet effective now       ?  ? ?  ?  ? ?  ?  ? ?Brief Summary: ?Patient is a 81 y.o.  male with a history of CAD, sigmoid adenocarcinoma-s/p resection in 4/14 2021, recent right upper lobectomy on 07/27/2021 for right upper lobe nodule (adenocarcinoma)-who presented with diffuse abdominal pain and melanotic stools.  Patient was initially evaluated at [facility] he was found to have a hemoglobin of 6.8.He was given 1 unit of PRBC transfusion-and subsequently transferred to [facility]. ?   ?Significant events: ?3/14>> transferred to [facility] from [place] GI bleeding with acute blood loss anemia. ?  ?Significant studies: ?  ?  ?Significant microbiology data: ?3/14>> COVID/influenza PCR: Negative ?  ?Procedures: ?3/15>> EGD: 2 clean-based 7-8 mm nonbleeding gastric ulcers, one large 1 cm cratered ulcer in the duodenal bulb with pigmented material. ?  ?Consults: ?GI ? ?Brief Hospital Course: ?Upper GI bleeding with acute blood loss anemia: Evaluated by GI-underwent EGD-see results above-probably course of upper GI bleeding was duodenal ulcer.  Was treated with PPI infusion since 3/14 (started at [facility]).thankfully-no further melanotic stools for more than 24  hours-hemoglobin is stable-we will plan to discharge him on PPI twice daily x8 weeks, followed by PPI daily.  Patient has been asked to follow with his primary gastroenterologist.   ?  ?History of CAD-s/p remote PCI: No anginal symptoms-or platelet agents were placed on hold-to resume in 1 week or so. ?  ?Seizure disorder: Continue phenobarbital and phenytoin. ?  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  ?History of right upper lung nodule-s/p robotic right upper lobectomy on 07/27/2021-adenocarcinoma on biopsy ?  ?Obesity: ?Estimated body mass index is 31.54 kg/m? as calculated from the following: ?  Height as of this encounter: 6' (1.829 m). ?  Weight as of this encounter: 105.5 kg.  ? ? ?Discharge Diagnoses:  ?Principal Problem: ?  Acute upper GI bleed ?Active Problems: ?  Acute blood loss anemia ?  Seizure disorder ([place]) ? ? ?Discharge Instructions: ? ?Activity:  ?As tolerated  ? ?Discharge Instructions   ? ? Call MD for:   Complete by: As directed ?  ? Black/bloody stools over if you vomit coffee-ground emesis/blood  ? Diet - low sodium heart healthy   Complete by: As directed ?  ? Discharge instructions   Complete by: As directed ?  ? Follow with Primary MD  [name], MD in 1-2 weeks ? ?Follow-up with [name] MD in [place] in the next 1-2 weeks. ? ?Your biopsy results from endoscopy are pending-and will need to be followed up-please inquire about these results when you follow-up with your  primary care MD or your primary gastroenterologist. ? ?Hold aspirin for 1 week-from day of EGD before resuming. ? ? ? ?Please get a complete blood count and chemistry panel checked by your Primary MD at your next visit, and again as instructed by your Primary MD. ? ?Get Medicines reviewed and adjusted: ?Please take all your medications with you for your next visit with your Primary MD ? ?Laboratory/radiological data: ?Please request your Primary MD to  go over all hospital tests and procedure/radiological results at the follow up, please ask your Primary MD to get all Hospital records sent to his/her office. ? ?In some cases, they will be blood work, cultures and biopsy results pending at the time of your discharge. Please request that your primary care M.D. follows up on these results. ? ?Also Note the following: ?If you experience worsening of your admission symptoms, develop shortness of breath, life threatening emergency, suicidal or homicidal thoughts you must seek medical attention immediately by calling 911 or calling your MD immediately  if symptoms less severe. ? ?You must read complete instructions/literature along with all the possible adverse reactions/side effects for all the Medicines you take and that have been prescribed to you. Take any new Medicines after you have completely understood and accpet all the possible adverse reactions/side effects.  ? ?Do not drive when taking Pain medications or sleeping medications (Benzodaizepines) ? ?Do not take more than prescribed Pain, Sleep and Anxiety Medications. It is not advisable to combine anxiety,sleep and pain medications without talking with your primary care practitioner ? ?Special Instructions: If you have smoked or chewed Tobacco  in the last 2 yrs please stop smoking, stop any regular Alcohol  and or any Recreational drug use. ? ?Wear Seat belts while driving. ? ?Please note: ?You were cared for by a hospitalist during your hospital stay. Once you are discharged, your primary care physician will handle any further medical issues. Please note that NO REFILLS for any discharge medications will be authorized once you are discharged, as it is imperative that you return to your primary care physician (or establish a relationship with a primary care physician if you do not have one) for your post hospital discharge needs so that they can reassess your need for medications and monitor your lab values.   ? Increase activity slowly   Complete by: As directed ?  ? ?  ? ?Allergies as of 08/25/2021   ? ?   Reactions  ? Phenergan [promethazine] Itching  ? Zofran [ondansetron] Itching  ? ?  ? ?  ?Medication List  ?  ? ?STOP taking these medications   ? ?promethazine 12.5 MG tablet ?Commonly known as: PHENERGAN ?  ?traMADol 50 MG tablet ?Commonly known as: ULTRAM ?  ? ?  ? ?TAKE these medications   ? ?acetaminophen 500 MG tablet ?Commonly known as: TYLENOL ?Take 1-2 tablets (500-1,000 mg total) by mouth every 6 (six) hours as needed. ?What changed: reasons to take this ?  ?aspirin EC 81 MG tablet ?Take 1 tablet (81 mg total) by mouth daily. Swallow whole. ?Start taking on: August 30, 2021 ?What changed:  ?medication strength ?how much to take ?additional instructions ?These instructions start on August 30, 2021. If you are unsure what to do until then, ask your doctor or other care provider. ?  ?cholecalciferol 25 MCG (1000 UNIT) tablet ?Commonly known as: VITAMIN D3 ?Take 1,000 Units by mouth daily. ?  ?diphenhydrAMINE 25 mg capsule ?Commonly known as: BENADRYL ?Take 50 mg by  mouth every 6 (six) hours as needed for itching or allergies. ?  ?dorzolamide-timolol 22.3-6.8 MG/ML ophthalmic solution ?Commonly known as: COSOPT ?Place 1 drop into both eyes 2 (two) times daily. ?  ?EYE HEALTH + LUTEIN PO ?Take 1 tablet by mouth daily. ?  ?ferrous sulfate 325 (65 FE) MG EC tablet ?Take 1 tablet (325 mg total) by mouth 2 (two) times daily. ?  ?furosemide 40 MG tablet ?Commonly known as: LASIX ?Take 40 mg by mouth daily. ?  ?hydrOXYzine 25 MG tablet ?Commonly known as: ATARAX ?Take 25 mg by mouth 4 (four) times daily as needed for itching (allergies). ?  ?latanoprost 0.005 % ophthalmic solution ?Commonly known as: XALATAN ?Place 1 drop into the right eye at bedtime. ?  ?nitroGLYCERIN 0.4 MG SL tablet ?Commonly known as: NITROSTAT ?Place 0.4 mg under the tongue every 5 (five) minutes as needed for chest pain. ?  ?pantoprazole 40 MG  tablet ?Commonly known as: Protonix ?Take 40 mg twice a day x8 weeks, followed by 40 mg once a day ?  ?PHENobarbital 32.4 MG tablet ?Commonly known as: LUMINAL ?Take 64.8 mg by mouth at bedtime. ?  ?phenytoin 100 MG ER

## 2021-08-25 NOTE — Plan of Care (Signed)

## 2021-08-25 NOTE — Care Management Important Message (Signed)
Important Message ? ?Patient Details  ?Name: Alan Melendez ?MRN: [id number] ?Date of Birth: [birth year] ? ? ?Medicare Important Message Given:  Yes ? ?Patient left prior to IM delivery will mail IM to the patient home address.  ? ? ? ?Clovis Warwick ?08/25/2021, 3:06 PM ?

## 2021-08-25 NOTE — TOC Transition Note (Signed)
Transition of Care (TOC) - CM/SW Discharge Note ? ? ?Patient Details  ?Name: Alan Melendez ?MRN: [id number] ?Date of Birth: 11-23-1940 ? ?Transition of Care (TOC) CM/SW Contact:  ?Tom-Johnson, Renea Ee, RN ?Phone Number: ?08/25/2021, 11:38 AM ? ? ?Clinical Narrative:    ? ?Patient is scheduled for discharge today. Home health recommendation explained to patient and [place]. List of agencies from Medicare.gov given to patient and he chose [facility].  Referral called into [place] and voiced acceptance.Information on AVS. PCP is [name], MD and uses [facility] in [place]. Denies any other needs. Daughter Leana Roe to transport at discharge. No further TOC needs noted.   ? ?Final next level of care: [place] ?Barriers to Discharge: Barriers Resolved ? ? ?Patient Goals and CMS Choice ?Patient states their goals for this hospitalization and ongoing recovery are:: To return home ?CMS Medicare.gov Compare Post Acute Care list provided to:: Patient ?Choice offered to / list presented to : Patient, Adult Children Leana Roe) ? ?Discharge Placement ?  ?           ?  ?Patient to be transferred to facility by: Daughter ?  ?  ? ?Discharge Plan and Services ?  ?  ?           ?DME Arranged: N/A ?  ?  ?  ?  ?HH Arranged: PT ?[place] Agency: [facility] ?Date HH Agency Contacted: 08/25/21 ?Time [place]: 2449 ?Representative spoke with at [place]: Delsa Sale ? ?Social Determinants of Health (SDOH) Interventions ?  ? ? ?Readmission Risk Interventions ?No flowsheet data found. ? ? ? ? ?

## 2021-08-27 DIAGNOSIS — D62 Acute posthemorrhagic anemia: Secondary | ICD-10-CM | POA: Diagnosis not present

## 2021-08-27 DIAGNOSIS — J449 Chronic obstructive pulmonary disease, unspecified: Secondary | ICD-10-CM | POA: Diagnosis not present

## 2021-08-27 DIAGNOSIS — Z85038 Personal history of other malignant neoplasm of large intestine: Secondary | ICD-10-CM | POA: Diagnosis not present

## 2021-08-27 DIAGNOSIS — K922 Gastrointestinal hemorrhage, unspecified: Secondary | ICD-10-CM | POA: Diagnosis not present

## 2021-08-27 DIAGNOSIS — G40909 Epilepsy, unspecified, not intractable, without status epilepticus: Secondary | ICD-10-CM | POA: Diagnosis not present

## 2021-08-27 DIAGNOSIS — I2510 Atherosclerotic heart disease of native coronary artery without angina pectoris: Secondary | ICD-10-CM | POA: Diagnosis not present

## 2021-08-27 LAB — METHYLMALONIC ACID, SERUM: Methylmalonic Acid, Quantitative: 161 nmol/L (ref 0–378)

## 2021-08-28 LAB — SURGICAL PATHOLOGY

## 2021-08-29 DIAGNOSIS — Z6834 Body mass index (BMI) 34.0-34.9, adult: Secondary | ICD-10-CM | POA: Diagnosis not present

## 2021-08-29 DIAGNOSIS — K269 Duodenal ulcer, unspecified as acute or chronic, without hemorrhage or perforation: Secondary | ICD-10-CM | POA: Diagnosis not present

## 2021-08-29 DIAGNOSIS — D62 Acute posthemorrhagic anemia: Secondary | ICD-10-CM | POA: Diagnosis not present

## 2021-08-29 DIAGNOSIS — D519 Vitamin B12 deficiency anemia, unspecified: Secondary | ICD-10-CM | POA: Diagnosis not present

## 2021-09-01 DIAGNOSIS — K922 Gastrointestinal hemorrhage, unspecified: Secondary | ICD-10-CM | POA: Diagnosis not present

## 2021-09-01 DIAGNOSIS — I2510 Atherosclerotic heart disease of native coronary artery without angina pectoris: Secondary | ICD-10-CM | POA: Diagnosis not present

## 2021-09-01 DIAGNOSIS — Z85038 Personal history of other malignant neoplasm of large intestine: Secondary | ICD-10-CM | POA: Diagnosis not present

## 2021-09-01 DIAGNOSIS — D62 Acute posthemorrhagic anemia: Secondary | ICD-10-CM | POA: Diagnosis not present

## 2021-09-01 DIAGNOSIS — G40909 Epilepsy, unspecified, not intractable, without status epilepticus: Secondary | ICD-10-CM | POA: Diagnosis not present

## 2021-09-01 DIAGNOSIS — J449 Chronic obstructive pulmonary disease, unspecified: Secondary | ICD-10-CM | POA: Diagnosis not present

## 2021-09-04 ENCOUNTER — Encounter (INDEPENDENT_AMBULATORY_CARE_PROVIDER_SITE_OTHER): Payer: Self-pay | Admitting: *Deleted

## 2021-09-05 ENCOUNTER — Encounter (HOSPITAL_COMMUNITY): Payer: Self-pay

## 2021-09-05 NOTE — Progress Notes (Signed)
Introductory phone call placed to patient today. Introduced myself and explained my role in the patient's care. Contact information provided. Patient appointment for tomorrow confirmed with patient along with visitor policy. All questions addressed and answered to patient's satisfaction. ?

## 2021-09-06 ENCOUNTER — Inpatient Hospital Stay (HOSPITAL_COMMUNITY): Payer: Medicare Other | Attending: Hematology | Admitting: Hematology

## 2021-09-06 DIAGNOSIS — D649 Anemia, unspecified: Secondary | ICD-10-CM | POA: Insufficient documentation

## 2021-09-06 DIAGNOSIS — Z87891 Personal history of nicotine dependence: Secondary | ICD-10-CM | POA: Diagnosis not present

## 2021-09-06 DIAGNOSIS — Z77090 Contact with and (suspected) exposure to asbestos: Secondary | ICD-10-CM | POA: Insufficient documentation

## 2021-09-06 DIAGNOSIS — C3411 Malignant neoplasm of upper lobe, right bronchus or lung: Secondary | ICD-10-CM

## 2021-09-06 DIAGNOSIS — C3491 Malignant neoplasm of unspecified part of right bronchus or lung: Secondary | ICD-10-CM

## 2021-09-06 NOTE — Progress Notes (Signed)
? ?[facility] ?[address] ?[place], [place] [address] ? ? ?CLINIC:  ?Medical Oncology/Hematology ? ?CONSULT NOTE ? ?Patient Care Team: ?[name], MD as PCP - General (Family Medicine) ?Derek Jack, MD as Medical Oncologist (Medical Oncology) ? ?CHIEF COMPLAINTS/PURPOSE OF CONSULTATION:  ?Evaluation of lung cancer ? ?HISTORY OF PRESENTING ILLNESS:  ?[name] 81 y.o. 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 Roxan Hockey. ? ?Today he reports feeling good, and he is accompanied by his daughter. He denies unintentional weight loss, but he reports he has lost 10 lbs since surgery; has been intentionally trying to lose weight. He denies new pains. His appetite is poor. He reports he has mostly returned to his baseline energy level. He started taking an iron tablet 1 week ago, and he is tolerating it well. He reports constipation, but he denies worsened constipation since starting iron tablets. He had a previous MI. He reports occasional ankle swellings. He denies history of DM.  ? ?He currently lives at home alone, and his daughter lives close by. His is able to do all of his daily activities independently. 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 He quit smoking cigarettes 3 years ago, but he continues to smoke a pipe. He smoked 2 ppd for 65 years. He denies family history of cancer. 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  ? ?MEDICAL HISTORY:  ?Past Medical History:  ?Diagnosis Date  ? Anxiety   ? Cataracts, bilateral   ? Colon cancer ([place])   ? Depression   ? Epilepsy ([place])   ? Glaucoma   ? Heart attack ([place])   ? History of kidney stones   ? ? ?SURGICAL HISTORY: ?Past Surgical History:  ?Procedure Laterality Date  ? APPENDECTOMY    ? BIOPSY  08/23/2021  ? Procedure: BIOPSY;  Surgeon: Juanita Craver, MD;  Location: [facility] ENDOSCOPY;  Service: Gastroenterology;;  ? CHOLECYSTECTOMY    ? COLONOSCOPY WITH PROPOFOL N/A  03/21/2021  ? Procedure: COLONOSCOPY WITH PROPOFOL;  Surgeon: Harvel Quale, MD;  [facility] ENDO SUITE;  Service: Gastroenterology;  Laterality: N/A;  1:45, pt knows to arrive at 9:30  ? coloonscopy    ? CORONARY ANGIOPLASTY    ? ESOPHAGOGASTRODUODENOSCOPY (EGD) WITH PROPOFOL N/A 08/23/2021  ? Procedure: ESOPHAGOGASTRODUODENOSCOPY (EGD) WITH PROPOFOL;  Surgeon: Juanita Craver, MD;  Location: [facility] ENDOSCOPY;  Service: Gastroenterology;  Laterality: N/A;  MELENA AND IDA  ? HEMOSTASIS CLIP PLACEMENT  03/21/2021  ? Procedure: HEMOSTASIS CLIP PLACEMENT;  Surgeon: Harvel Quale, MD;  Location: [facility] ENDO SUITE;  Service: Gastroenterology;;  ? INTERCOSTAL NERVE BLOCK Right 07/27/2021  ? Procedure: INTERCOSTAL NERVE BLOCK;  Surgeon: [name], MD;  Location: [facility];  Service: Thoracic;  Laterality: Right;  ? LYMPH NODE DISSECTION Right 07/27/2021  ? Procedure: LYMPH NODE DISSECTION;  Surgeon: [name], MD;  Location: [facility];  Service: Thoracic;  Laterality: Right;  ? POLYPECTOMY  03/21/2021  ? Procedure: POLYPECTOMY;  Surgeon: Harvel Quale, MD;  Location: [facility] ENDO [facility];  Service: Gastroenterology;;  ? SUBMUCOSAL TATTOO INJECTION  03/21/2021  ? Procedure: SUBMUCOSAL TATTOO INJECTION;  Surgeon: Montez Morita, Quillian Quince, MD;  Location: [facility] ENDO SUITE;  Service: Gastroenterology;;  ? ? ?SOCIAL HISTORY: ?Social History  ? ?Socioeconomic History  ? Marital status: Widowed  ?  Spouse name: Not on file  ? Number of children: 2  ? Years of education: Not on file  ?  Highest education level: Not on file  ?Occupational History  ? Not on file  ?Tobacco Use  ? Smoking status: Former  ?  Packs/day: 2.00  ?  Years: 50.00  ?  Pack years: 100.00  ?  Types: Cigarettes  ?  Quit date: 04/12/2019  ?  Years since quitting: 2.4  ? Smokeless tobacco: Never  ?Vaping Use  ? Vaping Use: Never used  ?Substance and Sexual Activity  ? Alcohol use: Not Currently  ? Drug use: Never  ? Sexual activity: Not  on file  ?Other Topics Concern  ? Not on file  ?Social History Narrative  ? Not on file  ? ?Social Determinants of Health  ? ?Financial Resource Strain: Not on file  ?Food Insecurity: Not on file  ?Transportation Needs: Not on file  ?Physical Activity: Not on file  ?Stress: Not on file  ?Social Connections: Not on file  ?Intimate Partner Violence: Not on file  ? ? ?FAMILY HISTORY: ?Family History  ?Problem Relation Age of Onset  ? Diabetes Father   ? Diabetes Sister   ? ? ?ALLERGIES:  ?is allergic to phenergan [promethazine] and zofran [ondansetron]. ? ?MEDICATIONS:  ?Current Outpatient Medications  ?Medication Sig Dispense Refill  ? acetaminophen (TYLENOL) 500 MG tablet Take 1-2 tablets (500-1,000 mg total) by mouth every 6 (six) hours as needed. (Patient taking differently: Take 500-1,000 mg by mouth every 6 (six) hours as needed for mild pain or headache.) 30 tablet 0  ? aspirin EC 81 MG tablet Take 1 tablet (81 mg total) by mouth daily. Swallow whole. 30 tablet 11  ? cholecalciferol (VITAMIN D3) 25 MCG (1000 UNIT) tablet Take 1,000 Units by mouth daily.    ? Cyanocobalamin (VITAMIN B 12 PO) Take 1 tablet by mouth daily.    ? diphenhydrAMINE (BENADRYL) 25 mg capsule Take 50 mg by mouth every 6 (six) hours as needed for itching or allergies.    ? dorzolamide-timolol (COSOPT) 22.3-6.8 MG/ML ophthalmic solution Place 1 drop into both eyes 2 (two) times daily.    ? ferrous sulfate 325 (65 FE) MG tablet Take 1 tablet (325 mg total) by mouth 2 (two) times daily. 60 tablet 3  ? furosemide (LASIX) 40 MG tablet Take 40 mg by mouth daily.    ? hydrOXYzine (ATARAX) 25 MG tablet Take 25 mg by mouth 4 (four) times daily as needed for itching (allergies).    ? latanoprost (XALATAN) 0.005 % ophthalmic solution Place 1 drop into the right eye at bedtime.    ? Multiple Vitamins-Minerals (EYE HEALTH + LUTEIN PO) Take 1 tablet by mouth daily.    ? Multiple Vitamins-Minerals (ZINC PO) Take 1 tablet by mouth daily.    ? nitroGLYCERIN  (NITROSTAT) 0.4 MG SL tablet Place 0.4 mg under the tongue every 5 (five) minutes as needed for chest pain.    ? pantoprazole (PROTONIX) 40 MG tablet Take 1 tablet (40 mg) twice a day x8 weeks, followed by 1 tablet (40 mg) once a day 60 tablet 11  ? PHENobarbital (LUMINAL) 32.4 MG tablet Take 64.8 mg by mouth at bedtime.    ? phenytoin (DILANTIN) 100 MG ER capsule Take 200 mg by mouth at bedtime.    ? ?No current facility-administered medications for this visit.  ? ? ?REVIEW OF SYSTEMS:   ?Review of Systems  ?Constitutional:  Positive for appetite change. Negative for fatigue and unexpected weight change.  ?Respiratory:  Positive for shortness of breath.   ?Cardiovascular:  Positive for leg swelling.  ?  Gastrointestinal:  Positive for constipation.  ?All other systems reviewed and are negative. ? ? ?PHYSICAL EXAMINATION: ?ECOG PERFORMANCE STATUS: 1 - Symptomatic but completely ambulatory ? ?Vitals:  ? 09/06/21 1333  ?Pulse: 75  ?Resp: 18  ?Temp: 97.9 ?F (36.6 ?C)  ?SpO2: 95%  ? ?Filed Weights  ? 09/06/21 1333  ?Weight: 230 lb 1.6 oz (104.4 kg)  ? ?Physical Exam ?Vitals reviewed.  ?Constitutional:   ?   Appearance: Normal appearance. He is obese.  ?Cardiovascular:  ?   Rate and Rhythm: Normal rate and regular rhythm.  ?   Pulses: Normal pulses.  ?   Heart sounds: Normal heart sounds.  ?Pulmonary:  ?   Effort: Pulmonary effort is normal.  ?   Breath sounds: Normal breath sounds.  ?Abdominal:  ?   Palpations: Abdomen is soft. There is no mass.  ?   Tenderness: There is no abdominal tenderness.  ?Neurological:  ?   General: No focal deficit present.  ?   Mental Status: He is alert and oriented to person, place, and time.  ?Psychiatric:     ?   Mood and Affect: Mood normal.     ?   Behavior: Behavior normal.  ? ? ? ?LABORATORY DATA:  ?I have reviewed the data as listed ? ?  Latest Ref Rng & Units 08/25/2021  ?  1:18 AM 08/24/2021  ?  8:22 AM 08/23/2021  ?  1:38 PM  ?CBC  ?WBC 4.0 - 10.5 K/uL 8.2   7.9   7.1    ?Hemoglobin  13.0 - 17.0 g/dL 8.1   8.2   8.2    ?Hematocrit 39.0 - 52.0 % 25.4   25.6   24.9    ?Platelets 150 - 400 K/uL 213   222   240    ? ? ?  Latest Ref Rng & Units 08/24/2021  ?  8:22 AM 08/23/2021  ?  3:18

## 2021-09-06 NOTE — Patient Instructions (Addendum)
[facility] at [facility] ?Discharge Instructions ? ?You were seen and examined today by Dr. Delton Coombes. Dr. Delton Coombes is a medical oncologist, meaning that he specializes in the treatment of cancer diagnoses. Dr. Delton Coombes discussed your past medical history, family history of cancers, and the events that led to you being here today. ? ?You have been diagnosed with Stage I Lung Adenocarcinoma. This is a common type of lung cancer. This cancer was completely removed during surgery and all lymph nodes tested after surgery were also negative for cancer. This is good news! This means that there is no need for any additional treatments such as chemotherapy. ? ?Dr. Delton Coombes has recommended follow-up CT scans of your chest on a regular basis with your next one being in approximately 3 months. You will have scans about every 6 months with follow-up appointments afterwards over the next several years to ensure that there cancer returning. ? ?Please call the clinic with any questions or concerns. ? ?Follow-up as scheduled. ? ? ?Thank you for choosing [place] at [facility] to provide your oncology and hematology care.  To afford each patient quality time with our provider, please arrive at least 15 minutes before your scheduled appointment time.  ? ?If you have a lab appointment with the [facility] please come in thru the Main Entrance and check in at the main information desk. ? ?You need to re-schedule your appointment should you arrive 10 or more minutes late.  We strive to give you quality time with our providers, and arriving late affects you and other patients whose appointments are after yours.  Also, if you no show three or more times for appointments you may be dismissed from the clinic at the providers discretion.     ?Again, thank you for choosing [facility].  Our hope is that these requests will decrease the amount of time that you wait before  being seen by our physicians.       ?_____________________________________________________________ ? ?Should you have questions after your visit to [facility], please contact our office at [phone] and follow the prompts.  Our office hours are 8:00 a.m. and 4:30 p.m. Monday - Friday.  Please note that voicemails left after 4:00 p.m. may not be returned until the following business day.  We are closed weekends and major holidays.  You do have access to a nurse 24-7, just call the main number to the clinic [phone] and do not press any options, hold on the line and a nurse will answer the phone.   ? ?For prescription refill requests, have your pharmacy contact our office and allow 72 hours.   ? ?Due to Covid, you will need to wear a mask upon entering the hospital. If you do not have a mask, a mask will be given to you at the Main Entrance upon arrival. For doctor visits, patients may have 1 support person age 76 or older with them. For treatment visits, patients can not have anyone with them due to social distancing guidelines and our immunocompromised population.  ? ? ? ?

## 2021-09-09 DIAGNOSIS — K922 Gastrointestinal hemorrhage, unspecified: Secondary | ICD-10-CM | POA: Diagnosis not present

## 2021-09-09 DIAGNOSIS — D62 Acute posthemorrhagic anemia: Secondary | ICD-10-CM | POA: Diagnosis not present

## 2021-09-09 DIAGNOSIS — J449 Chronic obstructive pulmonary disease, unspecified: Secondary | ICD-10-CM | POA: Diagnosis not present

## 2021-09-09 DIAGNOSIS — G40909 Epilepsy, unspecified, not intractable, without status epilepticus: Secondary | ICD-10-CM | POA: Diagnosis not present

## 2021-09-09 DIAGNOSIS — Z85038 Personal history of other malignant neoplasm of large intestine: Secondary | ICD-10-CM | POA: Diagnosis not present

## 2021-09-09 DIAGNOSIS — I2510 Atherosclerotic heart disease of native coronary artery without angina pectoris: Secondary | ICD-10-CM | POA: Diagnosis not present

## 2021-09-11 ENCOUNTER — Other Ambulatory Visit: Payer: Self-pay | Admitting: Thoracic Surgery (Cardiothoracic Vascular Surgery)

## 2021-09-11 DIAGNOSIS — C3491 Malignant neoplasm of unspecified part of right bronchus or lung: Secondary | ICD-10-CM

## 2021-09-12 ENCOUNTER — Ambulatory Visit (INDEPENDENT_AMBULATORY_CARE_PROVIDER_SITE_OTHER): Payer: Self-pay | Admitting: Thoracic Surgery (Cardiothoracic Vascular Surgery)

## 2021-09-12 ENCOUNTER — Ambulatory Visit
Admission: RE | Admit: 2021-09-12 | Discharge: 2021-09-12 | Disposition: A | Discharge status: Home / Self Care | Payer: Medicare Other | Source: Ambulatory Visit | Attending: Thoracic Surgery (Cardiothoracic Vascular Surgery) | Admitting: Thoracic Surgery (Cardiothoracic Vascular Surgery)

## 2021-09-12 ENCOUNTER — Other Ambulatory Visit (HOSPITAL_COMMUNITY): Payer: Self-pay | Admitting: *Deleted

## 2021-09-12 VITALS — BP 130/72 | HR 72 | Resp 20 | Ht 72.0 in | Wt 230.0 lb

## 2021-09-12 DIAGNOSIS — N429 Disorder of prostate, unspecified: Secondary | ICD-10-CM

## 2021-09-12 DIAGNOSIS — C3491 Malignant neoplasm of unspecified part of right bronchus or lung: Secondary | ICD-10-CM

## 2021-09-12 DIAGNOSIS — Z9889 Other specified postprocedural states: Secondary | ICD-10-CM

## 2021-09-12 DIAGNOSIS — Z85118 Personal history of other malignant neoplasm of bronchus and lung: Secondary | ICD-10-CM | POA: Diagnosis not present

## 2021-09-12 DIAGNOSIS — R911 Solitary pulmonary nodule: Secondary | ICD-10-CM

## 2021-09-12 NOTE — Progress Notes (Signed)
? ?   ?[address] ?      [place] [address] ?            [phone]   ? ? ?HPI: Alan Melendez returns for a postoperative follow-up visit after recent right upper lobectomy ? ?Alan Melendez is a 81 year old man with a history of heavy tobacco abuse, COPD, CAD, MI, stents, colon cancer, glaucoma, epilepsy, adenocarcinoma of the right upper lobe, and a bleeding ulcer.  He was found to have a right upper lobe lung nodule on a CT done for surveillance for colon cancer.  On PET/CT it was hypermetabolic. ? ?I did a robotic right upper lobectomy on 07/27/2021.  The nodule turned out to be a T2, N0, stage Ib adenocarcinoma.  He went home on postoperative day #3. ? ?I saw him back in the office on 08/15/2021 and he was complaining of a lot of nausea as well as some pain along the right costal margin.  About a week later he presented with abdominal pain and melena.  He was found to have a bleeding ulcer. ? ?Since his ulcer was treated his nausea has resolved.  He is not taking anything for pain.  His primary complaint right now is itching which he says is global.  He has an appointment with a dermatologist tomorrow. ? ?Past Medical History:  ?Diagnosis Date  ? Anxiety   ? Cataracts, bilateral   ? Colon cancer ([place])   ? Depression   ? Epilepsy ([place])   ? Glaucoma   ? Heart attack ([place])   ? History of kidney stones   ? ? ?Current Outpatient Medications  ?Medication Sig Dispense Refill  ? acetaminophen (TYLENOL) 500 MG tablet Take 1-2 tablets (500-1,000 mg total) by mouth every 6 (six) hours as needed. (Patient taking differently: Take 500-1,000 mg by mouth every 6 (six) hours as needed for mild pain or headache.) 30 tablet 0  ? aspirin EC 81 MG tablet Take 1 tablet (81 mg total) by mouth daily. Swallow whole. 30 tablet 11  ? cholecalciferol (VITAMIN D3) 25 MCG (1000 UNIT) tablet Take 1,000 Units by mouth daily.    ? Cyanocobalamin (VITAMIN B 12 PO) Take 1 tablet by mouth daily.    ? diphenhydrAMINE (BENADRYL) 25 mg  capsule Take 50 mg by mouth every 6 (six) hours as needed for itching or allergies.    ? dorzolamide-timolol (COSOPT) 22.3-6.8 MG/ML ophthalmic solution Place 1 drop into both eyes 2 (two) times daily.    ? ferrous sulfate 325 (65 FE) MG tablet Take 1 tablet (325 mg total) by mouth 2 (two) times daily. 60 tablet 3  ? furosemide (LASIX) 40 MG tablet Take 40 mg by mouth daily.    ? hydrOXYzine (ATARAX) 25 MG tablet Take 25 mg by mouth 4 (four) times daily as needed for itching (allergies).    ? latanoprost (XALATAN) 0.005 % ophthalmic solution Place 1 drop into the right eye at bedtime.    ? Multiple Vitamins-Minerals (EYE HEALTH + LUTEIN PO) Take 1 tablet by mouth daily.    ? Multiple Vitamins-Minerals (ZINC PO) Take 1 tablet by mouth daily.    ? nitroGLYCERIN (NITROSTAT) 0.4 MG SL tablet Place 0.4 mg under the tongue every 5 (five) minutes as needed for chest pain.    ? pantoprazole (PROTONIX) 40 MG tablet Take 1 tablet (40 mg) twice a day x8 weeks, followed by 1 tablet (40 mg) once a day 60 tablet 11  ? PHENobarbital (LUMINAL) 32.4  MG tablet Take 64.8 mg by mouth at bedtime.    ? phenytoin (DILANTIN) 100 MG ER capsule Take 200 mg by mouth at bedtime.    ? ?No current facility-administered medications for this visit.  ? ? ?Physical Exam ?BP 130/72   Pulse 72   Resp 20   Ht 6' (1.829 m)   Wt 230 lb (104.3 kg)   SpO2 95% Comment: RA  BMI 31.75 kg/m?  ?81 year old man in no acute distress ?Alert and oriented x3 with no focal deficits ?Lungs diminished at right base but otherwise clear ?Cardiac regular rate and rhythm ?Incisions healing well ? ?Diagnostic Tests: ?I personally reviewed his chest x-ray.  Postoperative changes from right upper lobectomy.  Otherwise unremarkable. ? ?Impression: ?Alan Melendez is a 81 year old man with a history of heavy tobacco abuse, COPD, CAD, MI, stents, colon cancer, glaucoma, epilepsy, adenocarcinoma of the right upper lobe, and a bleeding ulcer.   ? ?Adenocarcinoma right upper  lobe-stage Ib (T2, N0).  Status post right upper lobectomy.  He saw Dr. Delton Coombes.  No adjuvant therapy is indicated.  He will have a CT in 6 months. ? ?Bleeding ulcer with symptomatic anemia-treated by GI.  Nausea has resolved with treatment of the ulcer. ? ?Overall at this point he looks well.  There are no restrictions on his activities.  He should build into new activities gradually. ? ?Plan: ?I will plan to see him back in about 6 months after his CT scan with Dr. Delton Coombes. ? ?[name], MD ?[facility] ?[phone] ? ? ? ?

## 2021-09-13 DIAGNOSIS — D225 Melanocytic nevi of trunk: Secondary | ICD-10-CM | POA: Diagnosis not present

## 2021-09-13 DIAGNOSIS — L308 Other specified dermatitis: Secondary | ICD-10-CM | POA: Diagnosis not present

## 2021-09-13 DIAGNOSIS — B86 Scabies: Secondary | ICD-10-CM | POA: Diagnosis not present

## 2021-09-18 DIAGNOSIS — K922 Gastrointestinal hemorrhage, unspecified: Secondary | ICD-10-CM | POA: Diagnosis not present

## 2021-09-18 DIAGNOSIS — I2510 Atherosclerotic heart disease of native coronary artery without angina pectoris: Secondary | ICD-10-CM | POA: Diagnosis not present

## 2021-09-18 DIAGNOSIS — G40909 Epilepsy, unspecified, not intractable, without status epilepticus: Secondary | ICD-10-CM | POA: Diagnosis not present

## 2021-09-18 DIAGNOSIS — Z85038 Personal history of other malignant neoplasm of large intestine: Secondary | ICD-10-CM | POA: Diagnosis not present

## 2021-09-18 DIAGNOSIS — J449 Chronic obstructive pulmonary disease, unspecified: Secondary | ICD-10-CM | POA: Diagnosis not present

## 2021-09-18 DIAGNOSIS — D62 Acute posthemorrhagic anemia: Secondary | ICD-10-CM | POA: Diagnosis not present

## 2021-09-19 DIAGNOSIS — G40909 Epilepsy, unspecified, not intractable, without status epilepticus: Secondary | ICD-10-CM | POA: Diagnosis not present

## 2021-09-19 DIAGNOSIS — D62 Acute posthemorrhagic anemia: Secondary | ICD-10-CM | POA: Diagnosis not present

## 2021-09-19 DIAGNOSIS — K922 Gastrointestinal hemorrhage, unspecified: Secondary | ICD-10-CM | POA: Diagnosis not present

## 2021-09-19 DIAGNOSIS — J449 Chronic obstructive pulmonary disease, unspecified: Secondary | ICD-10-CM | POA: Diagnosis not present

## 2021-09-19 DIAGNOSIS — Z85038 Personal history of other malignant neoplasm of large intestine: Secondary | ICD-10-CM | POA: Diagnosis not present

## 2021-09-19 DIAGNOSIS — I2510 Atherosclerotic heart disease of native coronary artery without angina pectoris: Secondary | ICD-10-CM | POA: Diagnosis not present

## 2021-09-20 DIAGNOSIS — I2510 Atherosclerotic heart disease of native coronary artery without angina pectoris: Secondary | ICD-10-CM | POA: Diagnosis not present

## 2021-09-20 DIAGNOSIS — D62 Acute posthemorrhagic anemia: Secondary | ICD-10-CM | POA: Diagnosis not present

## 2021-09-20 DIAGNOSIS — K922 Gastrointestinal hemorrhage, unspecified: Secondary | ICD-10-CM | POA: Diagnosis not present

## 2021-09-20 DIAGNOSIS — G40909 Epilepsy, unspecified, not intractable, without status epilepticus: Secondary | ICD-10-CM | POA: Diagnosis not present

## 2021-09-20 DIAGNOSIS — Z85038 Personal history of other malignant neoplasm of large intestine: Secondary | ICD-10-CM | POA: Diagnosis not present

## 2021-09-20 DIAGNOSIS — J449 Chronic obstructive pulmonary disease, unspecified: Secondary | ICD-10-CM | POA: Diagnosis not present

## 2021-09-24 DIAGNOSIS — K269 Duodenal ulcer, unspecified as acute or chronic, without hemorrhage or perforation: Secondary | ICD-10-CM | POA: Diagnosis not present

## 2021-09-24 DIAGNOSIS — D62 Acute posthemorrhagic anemia: Secondary | ICD-10-CM | POA: Diagnosis not present

## 2021-09-26 DIAGNOSIS — K922 Gastrointestinal hemorrhage, unspecified: Secondary | ICD-10-CM | POA: Diagnosis not present

## 2021-09-26 DIAGNOSIS — D62 Acute posthemorrhagic anemia: Secondary | ICD-10-CM | POA: Diagnosis not present

## 2021-09-26 DIAGNOSIS — G40909 Epilepsy, unspecified, not intractable, without status epilepticus: Secondary | ICD-10-CM | POA: Diagnosis not present

## 2021-09-26 DIAGNOSIS — I2510 Atherosclerotic heart disease of native coronary artery without angina pectoris: Secondary | ICD-10-CM | POA: Diagnosis not present

## 2021-09-26 DIAGNOSIS — J449 Chronic obstructive pulmonary disease, unspecified: Secondary | ICD-10-CM | POA: Diagnosis not present

## 2021-09-26 DIAGNOSIS — Z85038 Personal history of other malignant neoplasm of large intestine: Secondary | ICD-10-CM | POA: Diagnosis not present

## 2021-09-30 DIAGNOSIS — K922 Gastrointestinal hemorrhage, unspecified: Secondary | ICD-10-CM | POA: Diagnosis not present

## 2021-09-30 DIAGNOSIS — J449 Chronic obstructive pulmonary disease, unspecified: Secondary | ICD-10-CM | POA: Diagnosis not present

## 2021-09-30 DIAGNOSIS — Z85038 Personal history of other malignant neoplasm of large intestine: Secondary | ICD-10-CM | POA: Diagnosis not present

## 2021-09-30 DIAGNOSIS — G40909 Epilepsy, unspecified, not intractable, without status epilepticus: Secondary | ICD-10-CM | POA: Diagnosis not present

## 2021-09-30 DIAGNOSIS — I2510 Atherosclerotic heart disease of native coronary artery without angina pectoris: Secondary | ICD-10-CM | POA: Diagnosis not present

## 2021-09-30 DIAGNOSIS — D62 Acute posthemorrhagic anemia: Secondary | ICD-10-CM | POA: Diagnosis not present

## 2021-10-02 DIAGNOSIS — Z85038 Personal history of other malignant neoplasm of large intestine: Secondary | ICD-10-CM | POA: Diagnosis not present

## 2021-10-02 DIAGNOSIS — J449 Chronic obstructive pulmonary disease, unspecified: Secondary | ICD-10-CM | POA: Diagnosis not present

## 2021-10-02 DIAGNOSIS — D62 Acute posthemorrhagic anemia: Secondary | ICD-10-CM | POA: Diagnosis not present

## 2021-10-02 DIAGNOSIS — K922 Gastrointestinal hemorrhage, unspecified: Secondary | ICD-10-CM | POA: Diagnosis not present

## 2021-10-02 DIAGNOSIS — G40909 Epilepsy, unspecified, not intractable, without status epilepticus: Secondary | ICD-10-CM | POA: Diagnosis not present

## 2021-10-02 DIAGNOSIS — I2510 Atherosclerotic heart disease of native coronary artery without angina pectoris: Secondary | ICD-10-CM | POA: Diagnosis not present

## 2021-10-05 DIAGNOSIS — D62 Acute posthemorrhagic anemia: Secondary | ICD-10-CM | POA: Diagnosis not present

## 2021-10-05 DIAGNOSIS — J449 Chronic obstructive pulmonary disease, unspecified: Secondary | ICD-10-CM | POA: Diagnosis not present

## 2021-10-05 DIAGNOSIS — K922 Gastrointestinal hemorrhage, unspecified: Secondary | ICD-10-CM | POA: Diagnosis not present

## 2021-10-05 DIAGNOSIS — G40909 Epilepsy, unspecified, not intractable, without status epilepticus: Secondary | ICD-10-CM | POA: Diagnosis not present

## 2021-10-05 DIAGNOSIS — Z85038 Personal history of other malignant neoplasm of large intestine: Secondary | ICD-10-CM | POA: Diagnosis not present

## 2021-10-05 DIAGNOSIS — I2510 Atherosclerotic heart disease of native coronary artery without angina pectoris: Secondary | ICD-10-CM | POA: Diagnosis not present

## 2021-10-06 DIAGNOSIS — I5032 Chronic diastolic (congestive) heart failure: Secondary | ICD-10-CM | POA: Diagnosis not present

## 2021-10-06 DIAGNOSIS — I25119 Atherosclerotic heart disease of native coronary artery with unspecified angina pectoris: Secondary | ICD-10-CM | POA: Diagnosis not present

## 2021-10-06 DIAGNOSIS — J449 Chronic obstructive pulmonary disease, unspecified: Secondary | ICD-10-CM | POA: Diagnosis not present

## 2021-10-06 DIAGNOSIS — R609 Edema, unspecified: Secondary | ICD-10-CM | POA: Diagnosis not present

## 2021-10-06 DIAGNOSIS — C3490 Malignant neoplasm of unspecified part of unspecified bronchus or lung: Secondary | ICD-10-CM | POA: Diagnosis not present

## 2021-10-06 DIAGNOSIS — E7849 Other hyperlipidemia: Secondary | ICD-10-CM | POA: Diagnosis not present

## 2021-10-06 DIAGNOSIS — E559 Vitamin D deficiency, unspecified: Secondary | ICD-10-CM | POA: Diagnosis not present

## 2021-10-06 DIAGNOSIS — G40909 Epilepsy, unspecified, not intractable, without status epilepticus: Secondary | ICD-10-CM | POA: Diagnosis not present

## 2021-11-08 DIAGNOSIS — L120 Bullous pemphigoid: Secondary | ICD-10-CM | POA: Diagnosis not present

## 2021-11-09 DIAGNOSIS — I10 Essential (primary) hypertension: Secondary | ICD-10-CM | POA: Diagnosis not present

## 2021-11-09 DIAGNOSIS — D649 Anemia, unspecified: Secondary | ICD-10-CM | POA: Diagnosis not present

## 2021-11-09 DIAGNOSIS — E782 Mixed hyperlipidemia: Secondary | ICD-10-CM | POA: Diagnosis not present

## 2021-11-09 DIAGNOSIS — D519 Vitamin B12 deficiency anemia, unspecified: Secondary | ICD-10-CM | POA: Diagnosis not present

## 2021-11-09 DIAGNOSIS — G40909 Epilepsy, unspecified, not intractable, without status epilepticus: Secondary | ICD-10-CM | POA: Diagnosis not present

## 2021-11-09 DIAGNOSIS — D529 Folate deficiency anemia, unspecified: Secondary | ICD-10-CM | POA: Diagnosis not present

## 2021-11-09 DIAGNOSIS — E7849 Other hyperlipidemia: Secondary | ICD-10-CM | POA: Diagnosis not present

## 2021-11-09 DIAGNOSIS — Z125 Encounter for screening for malignant neoplasm of prostate: Secondary | ICD-10-CM | POA: Diagnosis not present

## 2021-11-09 DIAGNOSIS — Z1329 Encounter for screening for other suspected endocrine disorder: Secondary | ICD-10-CM | POA: Diagnosis not present

## 2021-11-13 DIAGNOSIS — L120 Bullous pemphigoid: Secondary | ICD-10-CM | POA: Diagnosis not present

## 2021-11-14 DIAGNOSIS — E559 Vitamin D deficiency, unspecified: Secondary | ICD-10-CM | POA: Diagnosis not present

## 2021-11-14 DIAGNOSIS — I5032 Chronic diastolic (congestive) heart failure: Secondary | ICD-10-CM | POA: Diagnosis not present

## 2021-11-14 DIAGNOSIS — L120 Bullous pemphigoid: Secondary | ICD-10-CM | POA: Diagnosis not present

## 2021-11-14 DIAGNOSIS — G40909 Epilepsy, unspecified, not intractable, without status epilepticus: Secondary | ICD-10-CM | POA: Diagnosis not present

## 2021-11-14 DIAGNOSIS — E7849 Other hyperlipidemia: Secondary | ICD-10-CM | POA: Diagnosis not present

## 2021-11-14 DIAGNOSIS — J449 Chronic obstructive pulmonary disease, unspecified: Secondary | ICD-10-CM | POA: Diagnosis not present

## 2021-11-14 DIAGNOSIS — I25119 Atherosclerotic heart disease of native coronary artery with unspecified angina pectoris: Secondary | ICD-10-CM | POA: Diagnosis not present

## 2021-11-14 DIAGNOSIS — C3490 Malignant neoplasm of unspecified part of unspecified bronchus or lung: Secondary | ICD-10-CM | POA: Diagnosis not present

## 2021-11-14 DIAGNOSIS — D485 Neoplasm of uncertain behavior of skin: Secondary | ICD-10-CM | POA: Diagnosis not present

## 2021-11-14 DIAGNOSIS — F17210 Nicotine dependence, cigarettes, uncomplicated: Secondary | ICD-10-CM | POA: Diagnosis not present

## 2021-11-21 DIAGNOSIS — I5032 Chronic diastolic (congestive) heart failure: Secondary | ICD-10-CM | POA: Diagnosis not present

## 2021-11-21 DIAGNOSIS — E7849 Other hyperlipidemia: Secondary | ICD-10-CM | POA: Diagnosis not present

## 2021-11-21 DIAGNOSIS — G40909 Epilepsy, unspecified, not intractable, without status epilepticus: Secondary | ICD-10-CM | POA: Diagnosis not present

## 2021-11-21 DIAGNOSIS — I25119 Atherosclerotic heart disease of native coronary artery with unspecified angina pectoris: Secondary | ICD-10-CM | POA: Diagnosis not present

## 2021-11-21 DIAGNOSIS — Z6834 Body mass index (BMI) 34.0-34.9, adult: Secondary | ICD-10-CM | POA: Diagnosis not present

## 2021-11-21 DIAGNOSIS — L120 Bullous pemphigoid: Secondary | ICD-10-CM | POA: Diagnosis not present

## 2021-11-21 DIAGNOSIS — E559 Vitamin D deficiency, unspecified: Secondary | ICD-10-CM | POA: Diagnosis not present

## 2021-11-21 DIAGNOSIS — F17210 Nicotine dependence, cigarettes, uncomplicated: Secondary | ICD-10-CM | POA: Diagnosis not present

## 2021-11-21 DIAGNOSIS — J449 Chronic obstructive pulmonary disease, unspecified: Secondary | ICD-10-CM | POA: Diagnosis not present

## 2021-11-21 DIAGNOSIS — M810 Age-related osteoporosis without current pathological fracture: Secondary | ICD-10-CM | POA: Diagnosis not present

## 2021-11-21 DIAGNOSIS — M8008XS Age-related osteoporosis with current pathological fracture, vertebra(e), sequela: Secondary | ICD-10-CM | POA: Insufficient documentation

## 2021-12-01 ENCOUNTER — Encounter: Payer: Self-pay | Admitting: Internal Medicine

## 2021-12-01 ENCOUNTER — Ambulatory Visit (INDEPENDENT_AMBULATORY_CARE_PROVIDER_SITE_OTHER): Payer: Medicare Other | Admitting: Internal Medicine

## 2021-12-01 DIAGNOSIS — L120 Bullous pemphigoid: Secondary | ICD-10-CM | POA: Diagnosis not present

## 2021-12-01 DIAGNOSIS — R0609 Other forms of dyspnea: Secondary | ICD-10-CM

## 2021-12-01 DIAGNOSIS — C3490 Malignant neoplasm of unspecified part of unspecified bronchus or lung: Secondary | ICD-10-CM | POA: Diagnosis not present

## 2021-12-01 DIAGNOSIS — Z87891 Personal history of nicotine dependence: Secondary | ICD-10-CM | POA: Diagnosis not present

## 2021-12-01 DIAGNOSIS — E669 Obesity, unspecified: Secondary | ICD-10-CM | POA: Diagnosis not present

## 2021-12-01 DIAGNOSIS — J449 Chronic obstructive pulmonary disease, unspecified: Secondary | ICD-10-CM | POA: Diagnosis not present

## 2021-12-01 DIAGNOSIS — N402 Nodular prostate without lower urinary tract symptoms: Secondary | ICD-10-CM

## 2021-12-01 DIAGNOSIS — I252 Old myocardial infarction: Secondary | ICD-10-CM | POA: Diagnosis not present

## 2021-12-01 DIAGNOSIS — M8008XS Age-related osteoporosis with current pathological fracture, vertebra(e), sequela: Secondary | ICD-10-CM | POA: Diagnosis not present

## 2021-12-01 DIAGNOSIS — E782 Mixed hyperlipidemia: Secondary | ICD-10-CM | POA: Diagnosis not present

## 2021-12-01 DIAGNOSIS — G40909 Epilepsy, unspecified, not intractable, without status epilepticus: Secondary | ICD-10-CM | POA: Diagnosis not present

## 2021-12-01 DIAGNOSIS — R911 Solitary pulmonary nodule: Secondary | ICD-10-CM | POA: Diagnosis not present

## 2021-12-01 DIAGNOSIS — E559 Vitamin D deficiency, unspecified: Secondary | ICD-10-CM | POA: Diagnosis not present

## 2021-12-01 DIAGNOSIS — E041 Nontoxic single thyroid nodule: Secondary | ICD-10-CM

## 2021-12-01 DIAGNOSIS — D485 Neoplasm of uncertain behavior of skin: Secondary | ICD-10-CM | POA: Diagnosis not present

## 2021-12-01 DIAGNOSIS — I2510 Atherosclerotic heart disease of native coronary artery without angina pectoris: Secondary | ICD-10-CM | POA: Diagnosis not present

## 2021-12-01 DIAGNOSIS — Z9049 Acquired absence of other specified parts of digestive tract: Secondary | ICD-10-CM | POA: Diagnosis not present

## 2021-12-01 DIAGNOSIS — I5032 Chronic diastolic (congestive) heart failure: Secondary | ICD-10-CM | POA: Diagnosis not present

## 2021-12-01 DIAGNOSIS — C189 Malignant neoplasm of colon, unspecified: Secondary | ICD-10-CM | POA: Diagnosis not present

## 2021-12-01 DIAGNOSIS — I110 Hypertensive heart disease with heart failure: Secondary | ICD-10-CM | POA: Diagnosis not present

## 2021-12-01 DIAGNOSIS — J301 Allergic rhinitis due to pollen: Secondary | ICD-10-CM | POA: Diagnosis not present

## 2021-12-01 DIAGNOSIS — N400 Enlarged prostate without lower urinary tract symptoms: Secondary | ICD-10-CM | POA: Diagnosis not present

## 2021-12-02 ENCOUNTER — Encounter: Payer: Self-pay | Admitting: Internal Medicine

## 2021-12-02 NOTE — Assessment & Plan Note (Signed)
Quit smoking 2020 - 1st noted CT chest 08/24/19 > on CT neck 04/18/21 increased vs prior study up to 16 mm - PET   05/25/21 1. 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 2. Hypermetabolic nodule in the RIGHT lobe of thyroid gland is indeterminate. Recommend ultrasound-guided biopsy. 3. Hypermetabolic nodule in the RIGHT lobe of the prostate gland. Recommend correlation with PSA and if elevated recommend prostate MRI. >>>06/06/2021  rec refer to T surgery 07/27/21  RULobectomy Adenocarcinoma RUL-pathologic stage Ib (T2a, N0) >>> referred for thyroid bx/ urology evel 12/01/2021 >>>  Discussed in detail all the  indications, usual  risks and alternatives  relative to the benefits with patient who agrees to proceed with w/u as outlined.            Each maintenance medication was reviewed in detail including emphasizing most importantly the difference between maintenance and prns and under what circumstances the prns are to be triggered using an action plan format where appropriate.  Total time for H and P, chart review, counseling, ) and generating customized AVS unique to this office visit / same day charting = 35 min final summary f/u ov

## 2021-12-04 ENCOUNTER — Telehealth: Payer: Self-pay | Admitting: Internal Medicine

## 2021-12-04 DIAGNOSIS — E041 Nontoxic single thyroid nodule: Secondary | ICD-10-CM

## 2021-12-04 NOTE — Telephone Encounter (Signed)
Thyroid U/S has been scheduled by Central Scheduling.  Nothing further needed.

## 2021-12-06 ENCOUNTER — Ambulatory Visit (HOSPITAL_COMMUNITY): Admission: RE | Admit: 2021-12-06 | Payer: Medicare Other | Source: Ambulatory Visit

## 2021-12-06 DIAGNOSIS — H401112 Primary open-angle glaucoma, right eye, moderate stage: Secondary | ICD-10-CM | POA: Diagnosis not present

## 2021-12-07 ENCOUNTER — Inpatient Hospital Stay (HOSPITAL_COMMUNITY): Payer: Medicare Other | Attending: Hematology

## 2021-12-07 ENCOUNTER — Ambulatory Visit (HOSPITAL_COMMUNITY)
Admission: RE | Admit: 2021-12-07 | Discharge: 2021-12-07 | Disposition: A | Discharge status: Home / Self Care | Payer: Medicare Other | Source: Ambulatory Visit | Attending: Internal Medicine | Admitting: Internal Medicine

## 2021-12-07 ENCOUNTER — Ambulatory Visit (HOSPITAL_COMMUNITY)
Admission: RE | Admit: 2021-12-07 | Discharge: 2021-12-07 | Disposition: A | Discharge status: Home / Self Care | Payer: Medicare Other | Source: Ambulatory Visit | Attending: Hematology | Admitting: Hematology

## 2021-12-07 DIAGNOSIS — D649 Anemia, unspecified: Secondary | ICD-10-CM | POA: Diagnosis not present

## 2021-12-07 DIAGNOSIS — C3491 Malignant neoplasm of unspecified part of right bronchus or lung: Secondary | ICD-10-CM | POA: Insufficient documentation

## 2021-12-07 DIAGNOSIS — C3411 Malignant neoplasm of upper lobe, right bronchus or lung: Secondary | ICD-10-CM | POA: Diagnosis not present

## 2021-12-07 DIAGNOSIS — R972 Elevated prostate specific antigen [PSA]: Secondary | ICD-10-CM | POA: Insufficient documentation

## 2021-12-07 DIAGNOSIS — E041 Nontoxic single thyroid nodule: Secondary | ICD-10-CM | POA: Insufficient documentation

## 2021-12-07 DIAGNOSIS — N429 Disorder of prostate, unspecified: Secondary | ICD-10-CM

## 2021-12-07 LAB — CBC WITH DIFFERENTIAL/PLATELET
Abs Immature Granulocytes: 0.01 10*3/uL (ref 0.00–0.07)
Basophils Absolute: 0.1 10*3/uL (ref 0.0–0.1)
Basophils Relative: 1 %
Eosinophils Absolute: 0.3 10*3/uL (ref 0.0–0.5)
Eosinophils Relative: 4 %
HCT: 36.5 % — ABNORMAL LOW (ref 39.0–52.0)
Hemoglobin: 11.7 g/dL — ABNORMAL LOW (ref 13.0–17.0)
Immature Granulocytes: 0 %
Lymphocytes Relative: 23 %
Lymphs Abs: 1.5 10*3/uL (ref 0.7–4.0)
MCH: 28.3 pg (ref 26.0–34.0)
MCHC: 32.1 g/dL (ref 30.0–36.0)
MCV: 88.4 fL (ref 80.0–100.0)
Monocytes Absolute: 0.6 10*3/uL (ref 0.1–1.0)
Monocytes Relative: 9 %
Neutro Abs: 4.3 10*3/uL (ref 1.7–7.7)
Neutrophils Relative %: 63 %
Platelets: 236 10*3/uL (ref 150–400)
RBC: 4.13 MIL/uL — ABNORMAL LOW (ref 4.22–5.81)
RDW: 15.5 % (ref 11.5–15.5)
WBC: 6.8 10*3/uL (ref 4.0–10.5)
nRBC: 0 % (ref 0.0–0.2)

## 2021-12-07 LAB — COMPREHENSIVE METABOLIC PANEL
ALT: 15 U/L (ref 0–44)
AST: 15 U/L (ref 15–41)
Albumin: 3.5 g/dL (ref 3.5–5.0)
Alkaline Phosphatase: 126 U/L (ref 38–126)
Anion gap: 7 (ref 5–15)
BUN: 13 mg/dL (ref 8–23)
CO2: 23 mmol/L (ref 22–32)
Calcium: 8.8 mg/dL — ABNORMAL LOW (ref 8.9–10.3)
Chloride: 108 mmol/L (ref 98–111)
Creatinine, Ser: 0.74 mg/dL (ref 0.61–1.24)
GFR, Estimated: >60 mL/min (ref >60)
Glucose, Bld: 93 mg/dL (ref 70–99)
Potassium: 4 mmol/L (ref 3.5–5.1)
Sodium: 138 mmol/L (ref 135–145)
Total Bilirubin: 0.5 mg/dL (ref 0.3–1.2)
Total Protein: 6.4 g/dL — ABNORMAL LOW (ref 6.5–8.1)

## 2021-12-07 LAB — IRON AND TIBC
Iron: 82 ug/dL (ref 45–182)
Saturation Ratios: 28 % (ref 17.9–39.5)
TIBC: 290 ug/dL (ref 250–450)
UIBC: 208 ug/dL

## 2021-12-07 LAB — POCT I-STAT CREATININE: Creatinine, Ser: 0.8 mg/dL (ref 0.61–1.24)

## 2021-12-07 LAB — FERRITIN: Ferritin: 17 ng/mL — ABNORMAL LOW (ref 24–336)

## 2021-12-07 LAB — PSA: Prostatic Specific Antigen: 6.97 ng/mL — ABNORMAL HIGH (ref 0.00–4.00)

## 2021-12-07 MED ORDER — IOHEXOL 300 MG/ML  SOLN
75.0000 mL | Freq: Once | INTRAMUSCULAR | Status: AC | PRN
  Administered 2021-12-07: 75 mL via INTRAVENOUS

## 2021-12-08 ENCOUNTER — Telehealth: Payer: Self-pay | Admitting: Internal Medicine

## 2021-12-08 DIAGNOSIS — L120 Bullous pemphigoid: Secondary | ICD-10-CM | POA: Diagnosis not present

## 2021-12-08 DIAGNOSIS — J449 Chronic obstructive pulmonary disease, unspecified: Secondary | ICD-10-CM | POA: Diagnosis not present

## 2021-12-08 DIAGNOSIS — G40909 Epilepsy, unspecified, not intractable, without status epilepticus: Secondary | ICD-10-CM | POA: Diagnosis not present

## 2021-12-08 DIAGNOSIS — I5032 Chronic diastolic (congestive) heart failure: Secondary | ICD-10-CM | POA: Diagnosis not present

## 2021-12-08 DIAGNOSIS — M8008XS Age-related osteoporosis with current pathological fracture, vertebra(e), sequela: Secondary | ICD-10-CM | POA: Diagnosis not present

## 2021-12-08 DIAGNOSIS — I110 Hypertensive heart disease with heart failure: Secondary | ICD-10-CM | POA: Diagnosis not present

## 2021-12-08 NOTE — Telephone Encounter (Signed)
u/s thyroid 12/08/21 >  Likely benign/ f/u yearly deferred to PCP   No need for bx per radiology report

## 2021-12-08 NOTE — Telephone Encounter (Signed)
Patient was made aware of his results earlier. Will close encounter.

## 2021-12-08 NOTE — Telephone Encounter (Signed)
Patient Instructions by Tanda Rockers, MD at 12/01/2021 10:45 AM  Author: Tanda Rockers, MD Author Type: Physician Filed: 12/01/2021 11:16 AM  Note Status: Addendum Cosign: Cosign Not Required Encounter Date: 12/01/2021  Editor: Tanda Rockers, MD (Physician)      Prior Versions: 1. Tanda Rockers, MD (Physician) at 12/01/2021 11:11 AM - Signed    My office will be contacting you by phone for referral to urology for prostate nodule evaluation   My office will be contacting you by phone for referral to get ultrasound biopsy of thyroid         Dr. Melvyn Novas, please advise on the message from [facility] with [facility] radiology.

## 2021-12-13 DIAGNOSIS — L120 Bullous pemphigoid: Secondary | ICD-10-CM | POA: Diagnosis not present

## 2021-12-14 ENCOUNTER — Inpatient Hospital Stay (HOSPITAL_COMMUNITY): Payer: Medicare Other | Attending: Hematology | Admitting: Hematology

## 2021-12-14 ENCOUNTER — Encounter (HOSPITAL_COMMUNITY): Payer: Self-pay | Admitting: Hematology

## 2021-12-14 VITALS — BP 126/70 | HR 69 | Temp 98.0°F | Resp 16 | Wt 235.0 lb

## 2021-12-14 DIAGNOSIS — Z87891 Personal history of nicotine dependence: Secondary | ICD-10-CM | POA: Diagnosis not present

## 2021-12-14 DIAGNOSIS — M8008XS Age-related osteoporosis with current pathological fracture, vertebra(e), sequela: Secondary | ICD-10-CM | POA: Diagnosis not present

## 2021-12-14 DIAGNOSIS — D649 Anemia, unspecified: Secondary | ICD-10-CM | POA: Diagnosis not present

## 2021-12-14 DIAGNOSIS — I5032 Chronic diastolic (congestive) heart failure: Secondary | ICD-10-CM | POA: Diagnosis not present

## 2021-12-14 DIAGNOSIS — L120 Bullous pemphigoid: Secondary | ICD-10-CM | POA: Diagnosis not present

## 2021-12-14 DIAGNOSIS — C3491 Malignant neoplasm of unspecified part of right bronchus or lung: Secondary | ICD-10-CM

## 2021-12-14 DIAGNOSIS — J449 Chronic obstructive pulmonary disease, unspecified: Secondary | ICD-10-CM | POA: Diagnosis not present

## 2021-12-14 DIAGNOSIS — G40909 Epilepsy, unspecified, not intractable, without status epilepticus: Secondary | ICD-10-CM | POA: Diagnosis not present

## 2021-12-14 DIAGNOSIS — C3411 Malignant neoplasm of upper lobe, right bronchus or lung: Secondary | ICD-10-CM | POA: Diagnosis not present

## 2021-12-14 DIAGNOSIS — I110 Hypertensive heart disease with heart failure: Secondary | ICD-10-CM | POA: Diagnosis not present

## 2021-12-14 NOTE — Patient Instructions (Addendum)
[place] at [facility] Discharge Instructions  You were seen and examined today by Dr. Delton Coombes.  Dr. Delton Coombes discussed your most recent lab work and CT scan which revealed at least two new lung nodules, but they are small at this point.  Dr. Delton Coombes has recommended that we repeat CT scan in 4 months to monitor your lung nodules. Take over the counter ferrous sulfate 65 mg once daily daily.  Follow-up as scheduled in 4 months.  Thank you for choosing Agency at [facility] to provide your oncology and hematology care.  To afford each patient quality time with our provider, please arrive at least 15 minutes before your scheduled appointment time.   If you have a lab appointment with the [facility] please come in thru the Main Entrance and check in at the main information desk.  You need to re-schedule your appointment should you arrive 10 or more minutes late.  We strive to give you quality time with our providers, and arriving late affects you and other patients whose appointments are after yours.  Also, if you no show three or more times for appointments you may be dismissed from the clinic at the providers discretion.     Again, thank you for choosing [facility].  Our hope is that these requests will decrease the amount of time that you wait before being seen by our physicians.       _____________________________________________________________  Should you have questions after your visit to [facility], please contact our office at [phone] and follow the prompts.  Our office hours are 8:00 a.m. and 4:30 p.m. Monday - Friday.  Please note that voicemails left after 4:00 p.m. may not be returned until the following business day.  We are closed weekends and major holidays.  You do have access to a nurse 24-7, just call the main number to the clinic [phone] and do not press any options, hold on the  line and a nurse will answer the phone.    For prescription refill requests, have your pharmacy contact our office and allow 72 hours.

## 2021-12-14 NOTE — Progress Notes (Signed)
[place], [place] [address]   CLINIC:  Medical Oncology/Hematology  PCP:  [name], MD [address] [place] [address] [phone]   REASON FOR VISIT:  Follow-up for lung cancer  PRIOR THERAPY: Right upper lobectomy 07/27/2021  NGS Results: not done  CURRENT THERAPY: under work-up  BRIEF ONCOLOGIC HISTORY:  Oncology History  Adenocarcinoma of right lung ([place])  09/06/2021 Initial Diagnosis   Adenocarcinoma of right lung ([place])   09/06/2021 Cancer Staging   Staging form: Lung, AJCC 8th Edition - Clinical stage from 09/06/2021: Stage IB (cT2a, cN0, cM0) - Signed by Derek Jack, MD on 09/06/2021 Histopathologic type: Adenocarcinoma, NOS     CANCER STAGING: Cancer Staging  Adenocarcinoma of right lung [facility]) Staging form: Lung, AJCC 8th Edition - Clinical stage from 09/06/2021: Stage IB (cT2a, cN0, cM0) - Signed by Derek Jack, MD on 09/06/2021   INTERVAL HISTORY:  [name], a 81 y.o. male, returns for routine follow-up of his lung cancer. Jarmar was last seen on 09/06/2021.   Today he reports feeling good. His weight is stable. He is not taking iron tablets. He denies hematochezia and black stools.   REVIEW OF SYSTEMS:  Review of Systems  Constitutional:  Negative for appetite change, fatigue and unexpected weight change.  Gastrointestinal:  Negative for blood in stool.  All other systems reviewed and are negative.   PAST MEDICAL/SURGICAL HISTORY:  Past Medical History:  Diagnosis Date   Anxiety    Cataracts, bilateral    Colon cancer ([place])    Depression    Epilepsy ([place])    Glaucoma    Heart attack ([place])    History of kidney stones    Past Surgical History:  Procedure Laterality Date   APPENDECTOMY     BIOPSY  08/23/2021   Procedure: BIOPSY;  Surgeon: Juanita Craver, MD;  Location: [facility] ENDOSCOPY;  Service: Gastroenterology;;   CHOLECYSTECTOMY     COLONOSCOPY WITH PROPOFOL N/A 03/21/2021    Procedure: COLONOSCOPY WITH PROPOFOL;  Surgeon: Harvel Quale, MD;  [facility] ENDO [facility];  Service: Gastroenterology;  Laterality: N/A;  1:45, pt knows to arrive at 9:30   coloonscopy     CORONARY ANGIOPLASTY     ESOPHAGOGASTRODUODENOSCOPY (EGD) WITH PROPOFOL N/A 08/23/2021   Procedure: ESOPHAGOGASTRODUODENOSCOPY (EGD) WITH PROPOFOL;  Surgeon: Juanita Craver, MD;  Location[facility] ENDOSCOPY;  Service: Gastroenterology;  Laterality: N/A;  MELENA AND IDA   HEMOSTASIS CLIP PLACEMENT  03/21/2021   Procedure: HEMOSTASIS CLIP PLACEMENT;  Surgeon: Harvel Quale, MD;  Location: [facility] ENDO [facility];  Service: Gastroenterology;;   INTERCOSTAL NERVE BLOCK Right 07/27/2021   Procedure: INTERCOSTAL NERVE BLOCK;  Surgeon: [name], MD;  Location: [facility];  Service: Thoracic;  Laterality: Right;   LYMPH NODE DISSECTION Right 07/27/2021   Procedure: LYMPH NODE DISSECTION;  Surgeon: [name], MD;  Location: [facility];  Service: Thoracic;  Laterality: Right;   POLYPECTOMY  03/21/2021   Procedure: POLYPECTOMY;  Surgeon: Harvel Quale, MD;  [facility];  Service: Gastroenterology;;   SUBMUCOSAL TATTOO INJECTION  03/21/2021   Procedure: SUBMUCOSAL TATTOO INJECTION;  Surgeon: Harvel Quale, MD;  Location: [facility];  Service: Gastroenterology;;    SOCIAL HISTORY:  Social History   Socioeconomic History   Marital status: Widowed    Spouse name: Not on file   Number of children: 2   Years of education: Not on file   Highest education  level: Not on file  Occupational History   Not on file  Tobacco Use   Smoking status: Former    Packs/day: 2.00    Years: 50.00    Total pack years: 100.00    Types: Cigarettes    Quit date: 04/12/2019    Years since quitting: 2.6   Smokeless tobacco: Never  Vaping Use   Vaping Use: Never used  Substance and Sexual Activity   Alcohol use: Not Currently   Drug use: Never   Sexual activity: Not on  file  Other Topics Concern   Not on file  Social History Narrative   Not on file   Social Determinants of Health   Financial Resource Strain: Not on file  Food Insecurity: Not on file  Transportation Needs: Not on file  Physical Activity: Not on file  Stress: Not on file  Social Connections: Not on file  Intimate Partner Violence: Not on file    FAMILY HISTORY:  Family History  Problem Relation Age of Onset   Diabetes Father    Diabetes Sister     CURRENT MEDICATIONS:  Current Outpatient Medications  Medication Sig Dispense Refill   acetaminophen (TYLENOL) 500 MG tablet Take 1-2 tablets (500-1,000 mg total) by mouth every 6 (six) hours as needed. (Patient taking differently: Take 500-1,000 mg by mouth every 6 (six) hours as needed for mild pain or headache.) 30 tablet 0   aspirin EC 81 MG tablet Take 1 tablet (81 mg total) by mouth daily. Swallow whole. 30 tablet 11   cholecalciferol (VITAMIN D3) 25 MCG (1000 UNIT) tablet Take 1,000 Units by mouth daily.     Cyanocobalamin (VITAMIN B 12 PO) Take 1 tablet by mouth daily.     diphenhydrAMINE (BENADRYL) 25 mg capsule Take 50 mg by mouth every 6 (six) hours as needed for itching or allergies.     dorzolamide-timolol (COSOPT) 22.3-6.8 MG/ML ophthalmic solution Place 1 drop into both eyes 2 (two) times daily.     ferrous sulfate 325 (65 FE) MG tablet Take 1 tablet (325 mg total) by mouth 2 (two) times daily. 60 tablet 3   furosemide (LASIX) 40 MG tablet Take 40 mg by mouth daily.     hydrOXYzine (ATARAX) 25 MG tablet Take 25 mg by mouth 4 (four) times daily as needed for itching (allergies).     latanoprost (XALATAN) 0.005 % ophthalmic solution Place 1 drop into the right eye at bedtime.     Multiple Vitamins-Minerals (EYE HEALTH + LUTEIN PO) Take 1 tablet by mouth daily.     Multiple Vitamins-Minerals (ZINC PO) Take 1 tablet by mouth daily.     nitroGLYCERIN (NITROSTAT) 0.4 MG SL tablet Place 0.4 mg under the tongue every 5 (five)  minutes as needed for chest pain.     pantoprazole (PROTONIX) 40 MG tablet Take 1 tablet (40 mg) twice a day x8 weeks, followed by 1 tablet (40 mg) once a day 60 tablet 11   PHENobarbital (LUMINAL) 32.4 MG tablet Take 64.8 mg by mouth at bedtime.     phenytoin (DILANTIN) 100 MG ER capsule Take 200 mg by mouth at bedtime.     No current facility-administered medications for this visit.    ALLERGIES:  Allergies  Allergen Reactions   Phenergan [Promethazine] Itching   Zofran [Ondansetron] Itching    PHYSICAL EXAM:  Performance status (ECOG): 1 - Symptomatic but completely ambulatory  There were no vitals filed for this visit. Wt Readings from Last 3 Encounters:  12/01/21  234 lb 3.2 oz (106.2 kg)  09/12/21 230 lb (104.3 kg)  09/06/21 230 lb 1.6 oz (104.4 kg)   Physical Exam Vitals reviewed.  Constitutional:      Appearance: Normal appearance. He is obese.  Cardiovascular:     Rate and Rhythm: Normal rate and regular rhythm.     Pulses: Normal pulses.     Heart sounds: Normal heart sounds.  Pulmonary:     Effort: Pulmonary effort is normal.     Breath sounds: Normal breath sounds.  Neurological:     General: No focal deficit present.     Mental Status: He is alert and oriented to person, place, and time.  Psychiatric:        Mood and Affect: Mood normal.        Behavior: Behavior normal.      LABORATORY DATA:  I have reviewed the labs as listed.     Latest Ref Rng & Units 12/07/2021   12:51 PM 08/25/2021    1:18 AM 08/24/2021    8:22 AM  CBC  WBC 4.0 - 10.5 K/uL 6.8  8.2  7.9   Hemoglobin 13.0 - 17.0 g/dL 11.7  8.1  8.2   Hematocrit 39.0 - 52.0 % 36.5  25.4  25.6   Platelets 150 - 400 K/uL 236  213  222       Latest Ref Rng & Units 12/07/2021    2:42 PM 12/07/2021   12:51 PM 08/24/2021    8:22 AM  CMP  Glucose 70 - 99 mg/dL  93  103   BUN 8 - 23 mg/dL  13  11   Creatinine 0.61 - 1.24 mg/dL 0.80  0.74  0.65   Sodium 135 - 145 mmol/L  138  137   Potassium 3.5 -  5.1 mmol/L  4.0  3.7   Chloride 98 - 111 mmol/L  108  105   CO2 22 - 32 mmol/L  23  23   Calcium 8.9 - 10.3 mg/dL  8.8  8.5   Total Protein 6.5 - 8.1 g/dL  6.4    Total Bilirubin 0.3 - 1.2 mg/dL  0.5    Alkaline Phos 38 - 126 U/L  126    AST 15 - 41 U/L  15    ALT 0 - 44 U/L  15      DIAGNOSTIC IMAGING:  I have independently reviewed the scans and discussed with the patient. CT Chest W Contrast  Result Date: 12/08/2021 CLINICAL DATA:  Non-small cell right upper lobe lung cancer restaging, right upper lobectomy in February 2023. Also history of colon cancer. EXAM: CT CHEST WITH CONTRAST TECHNIQUE: Multidetector CT imaging of the chest was performed during intravenous contrast administration. RADIATION DOSE REDUCTION: This exam was performed according to the departmental dose-optimization program which includes automated exposure control, adjustment of the mA and/or kV according to patient size and/or use of iterative reconstruction technique. CONTRAST:  62mL OMNIPAQUE IOHEXOL 300 MG/ML  SOLN COMPARISON:  PET-CT 05/25/2021 FINDINGS: Cardiovascular: Coronary, aortic arch, and branch vessel atherosclerotic vascular disease. Mediastinum/Nodes: Unremarkable Lungs/Pleura: Right upper lobectomy. New 7 by 7 by 7 mm (volume = 200 mm^3) right middle lobe nodule anteriorly on image 81 of series 4, not visible on 05/25/2021 and also not present on 07/27/2019. New 4 by 3 by 4 mm (volume = 30 mm^3) right lower lobe nodule on image 77 series 4. Subpleural scarring or atelectasis laterally in the right lower lobe on image 107 series 4. Mild  scarring in the lingula and in the left lower lobe along the diaphragm. Mild cylindrical bronchiectasis in the left lower lobe. Upper Abdomen: Stable nodularity of the adrenal glands, previously low-density and previously not substantially hypermetabolic, compatible with adrenal adenomas. Contour similar back through 07/27/2019. Musculoskeletal: Lower thoracic spondylosis.  IMPRESSION: 1. New 7 mm in diameter right middle lobe nodule anteriorly on image 81 series 4. Although benign/inflammatory etiologies are not excluded, in this clinical context the strong possibility that this represents a metastatic nodule must be considered. This lesion is about at the borderline of sensitive PET-CT size thresholds, and PET-CT would not be unreasonable given the very high FDG activity of the original right upper lobe nodule. 2. Newly appreciable 4 by 3 by 4 mm right lower lobe pulmonary nodule on image 77 series 4. 3.  Aortic Atherosclerosis (ICD10-I70.0).  Coronary atherosclerosis. 4. Bilateral adrenal adenomas. Electronically Signed   By: Van Clines M.D.   On: 12/08/2021 15:22   US THYROID  Result Date: 12/07/2021 CLINICAL DATA:  Thyroid nodule EXAM: THYROID ULTRASOUND TECHNIQUE: Ultrasound examination of the thyroid gland and adjacent soft tissues was performed. COMPARISON:  None available FINDINGS: Parenchymal Echotexture: Mildly heterogenous Isthmus: 0.4 cm Right lobe: 5.1 x 2.8 x 2.5 cm Left lobe: 4.5 x 1.9 x 1.7 cm _________________________________________________________ Estimated total number of nodules >/= 1 cm: 2 Number of spongiform nodules >/=  2 cm not described below (TR1): 0 Number of mixed cystic and solid nodules >/= 1.5 cm not described below (TR2): 0 _________________________________________________________ Nodule # 1: Location: Right; inferior Maximum size: 1.1 cm; Other 2 dimensions: 1.1 x 0.9 cm Composition: solid/almost completely solid (2) Echogenicity: hypoechoic (2) Shape: not taller-than-wide (0) Margins: ill-defined (0) Echogenic foci: none (0) ACR TI-RADS total points: 4. ACR TI-RADS risk category: TR4 (4-6 points). ACR TI-RADS recommendations: *Given size (>/= 1 - 1.4 cm) and appearance, a follow-up ultrasound in 1 year should be considered based on TI-RADS criteria. _________________________________________________________ Nodule # 2: Location: Right;  inferior Maximum size: 1.5 cm; Other 2 dimensions: 1.3 x 1.0 cm Composition: solid/almost completely solid (2) Echogenicity: isoechoic (1) Shape: not taller-than-wide (0) Margins: ill-defined (0) Echogenic foci: none (0) ACR TI-RADS total points: 3. ACR TI-RADS risk category: TR3 (3 points). ACR TI-RADS recommendations: *Given size (>/= 1.5 - 2.4 cm) and appearance, a follow-up ultrasound in 1 year should be considered based on TI-RADS criteria. IMPRESSION: 1. Nodule 1 (TI-RADS 4), measuring 1.1 cm, located in the inferior right thyroid lobe meets criteria for imaging follow-up. Annual ultrasound surveillance is recommended until 5 years of stability is documented. 2. Nodule 2 (TI-RADS 3), measuring 1.5 cm, located in the inferior right thyroid lobe meets criteria for imaging follow-up. Annual ultrasound surveillance is recommended until 5 years of stability is documented. The above is in keeping with the ACR TI-RADS recommendations - J Am Coll Radiol 2017;14:587-595. Electronically Signed   By: Miachel Roux M.D.   On: 12/07/2021 14:23     ASSESSMENT:  Stage Ib (T2AN0) adenocarcinoma of the right lung: - PET scan on 05/25/2021: 1.4 cm right upper lobe spiculated nodule SUV 6.6.  Hypermetabolic nodule in the right lobe of the thyroid indeterminate.  Hypermetabolic nodule in the right lobe of the prostate gland. - Right upper lobe wedge resection on 07/27/2021 - Pathology: 1.2 cm adenocarcinoma, acinar predominant, carcinoma focally involving visceral pleura, margins negative.  No LVI.  0/17 lymph nodes involved.  Nodal sites examined stations hilar, 4, 7, 9, 10, 11, 12, 13.  PT2APN0.  Social/family history: - He lives by himself.  He is seen with his daughter Leana Roe today. - He worked Armed forces logistics/support/administrative officer.  He had work-related asbestos exposure.  He quit smoking 3 years ago and smoked 2 packs/day for 65 years. - No family history of malignancies.   PLAN:  Stage Ib (T2N0) adenocarcinoma right  lung: - He does not report any chest pains or shortness of breath. - Reviewed CT chest with contrast (12/07/2021): New 7 mm right middle lobe nodule, could be benign/inflammatory etiologies.  New 4 x 3 x 4 mm right lower lobe lung nodule.  Bilateral adrenal adenomas.  No other suspicious nodules. - Based on the new findings, I have recommended CT chest with contrast in 4 months. - We have checked PSA level today which is elevated at 6.97 based on previous PET findings.  We will plan to repeat it at next visit.  If it is still high, will refer to urology. - Also consider ultrasound of the thyroid gland based on the previous PET findings.  2.  Normocytic anemia: - Ferritin is 17 and percent saturation 28.  Hemoglobin 11.7. - Restart taking iron tablet daily.  We will repeat iron panel at next visit.   Orders placed this encounter:  No orders of the defined types were placed in this encounter.    Derek Jack, MD [place] [phone]   I, Thana Ates, am acting as a scribe for Dr. Derek Jack.  I, Derek Jack MD, have reviewed the above documentation for accuracy and completeness, and I agree with the above.

## 2021-12-15 ENCOUNTER — Other Ambulatory Visit (HOSPITAL_COMMUNITY): Payer: Self-pay

## 2021-12-15 DIAGNOSIS — N429 Disorder of prostate, unspecified: Secondary | ICD-10-CM

## 2021-12-19 ENCOUNTER — Ambulatory Visit: Payer: Medicare Other | Admitting: Thoracic Surgery (Cardiothoracic Vascular Surgery)

## 2021-12-19 DIAGNOSIS — I110 Hypertensive heart disease with heart failure: Secondary | ICD-10-CM | POA: Diagnosis not present

## 2021-12-19 DIAGNOSIS — J449 Chronic obstructive pulmonary disease, unspecified: Secondary | ICD-10-CM | POA: Diagnosis not present

## 2021-12-19 DIAGNOSIS — L120 Bullous pemphigoid: Secondary | ICD-10-CM | POA: Diagnosis not present

## 2021-12-19 DIAGNOSIS — I5032 Chronic diastolic (congestive) heart failure: Secondary | ICD-10-CM | POA: Diagnosis not present

## 2021-12-19 DIAGNOSIS — M8008XS Age-related osteoporosis with current pathological fracture, vertebra(e), sequela: Secondary | ICD-10-CM | POA: Diagnosis not present

## 2021-12-19 DIAGNOSIS — G40909 Epilepsy, unspecified, not intractable, without status epilepticus: Secondary | ICD-10-CM | POA: Diagnosis not present

## 2021-12-26 ENCOUNTER — Ambulatory Visit (INDEPENDENT_AMBULATORY_CARE_PROVIDER_SITE_OTHER): Payer: Medicare Other | Admitting: Urology

## 2021-12-26 VITALS — BP 135/68 | HR 70 | Ht 71.0 in | Wt 230.0 lb

## 2021-12-26 DIAGNOSIS — N401 Enlarged prostate with lower urinary tract symptoms: Secondary | ICD-10-CM | POA: Diagnosis not present

## 2021-12-26 DIAGNOSIS — R972 Elevated prostate specific antigen [PSA]: Secondary | ICD-10-CM

## 2021-12-26 DIAGNOSIS — R350 Frequency of micturition: Secondary | ICD-10-CM | POA: Diagnosis not present

## 2021-12-26 DIAGNOSIS — I2510 Atherosclerotic heart disease of native coronary artery without angina pectoris: Secondary | ICD-10-CM | POA: Diagnosis not present

## 2021-12-26 NOTE — Progress Notes (Signed)
H&P  Chief Complaint: Elevated PSA, abnormal findings on PET scan  History of Present Illness: 81 year old male comes in today with his daughter for follow-up.  I last saw him in September 2021.  At that time his PSA was 5.5 and he had bilateral prostate nodularity.  Ultrasound and biopsy recommended.  The patient refused to undergo this.  He has not followed up here since that time.  He is status post thoracic surgery with Dr. Koleen Nimrod in February of this year for lung cancer.  Prior to that, he had PET scan for a pulmonary nodule.  Findings:1. 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 2. Hypermetabolic nodule in the RIGHT lobe of thyroid gland is indeterminate. Recommend ultrasound-guided biopsy. 3. Hypermetabolic nodule in the RIGHT lobe of the prostate gland. Recommend correlation with PSA and if elevated recommend prostate MRI.   Because of the abnormality in the prostate, he comes in for follow-up.  He has no significant lower urinary tract symptoms.  Past Medical History:  Diagnosis Date   Anxiety    Cataracts, bilateral    Colon cancer ([place])    Depression    Epilepsy ([place])    Glaucoma    Heart attack ([place])    History of kidney stones     Past Surgical History:  Procedure Laterality Date   APPENDECTOMY     BIOPSY  08/23/2021   Procedure: BIOPSY;  Surgeon: Juanita Craver, MD;  Location: [facility] ENDOSCOPY;  Service: Gastroenterology;;   CHOLECYSTECTOMY     COLONOSCOPY WITH PROPOFOL N/A 03/21/2021   Procedure: COLONOSCOPY WITH PROPOFOL;  Surgeon: Harvel Quale, MD;  [facility] ENDO [facility];  Service: Gastroenterology;  Laterality: N/A;  1:45, pt knows to arrive at 9:30   coloonscopy     CORONARY ANGIOPLASTY     ESOPHAGOGASTRODUODENOSCOPY (EGD) WITH PROPOFOL N/A 08/23/2021   Procedure: ESOPHAGOGASTRODUODENOSCOPY (EGD) WITH PROPOFOL;  Surgeon: Juanita Craver, MD;  Location: [facility] ENDOSCOPY;   Service: Gastroenterology;  Laterality: N/A;  MELENA AND IDA   HEMOSTASIS CLIP PLACEMENT  03/21/2021   Procedure: HEMOSTASIS CLIP PLACEMENT;  Surgeon: Harvel Quale, MD;  [facility];  Service: Gastroenterology;;   INTERCOSTAL NERVE BLOCK Right 07/27/2021   Procedure: INTERCOSTAL NERVE BLOCK;  Surgeon: [name], MD;  Location: [facility];  Service: Thoracic;  Laterality: Right;   LYMPH NODE DISSECTION Right 07/27/2021   Procedure: LYMPH NODE DISSECTION;  Surgeon: [name], MD;  Location: [place];  Service: Thoracic;  Laterality: Right;   POLYPECTOMY  03/21/2021   Procedure: POLYPECTOMY;  Surgeon: Harvel Quale, MD;  [facility];  Service: Gastroenterology;;   SUBMUCOSAL TATTOO INJECTION  03/21/2021   Procedure: SUBMUCOSAL TATTOO INJECTION;  Surgeon: Harvel Quale, MD;  [facility];  Service: Gastroenterology;;    Home Medications:  Allergies as of 12/26/2021       Reactions   Phenergan [promethazine] Itching   Zofran [ondansetron] Itching        Medication List        Accurate as of December 26, 2021  2:46 PM. If you have any questions, ask your nurse or doctor.          acetaminophen 500 MG tablet Commonly known as: TYLENOL Take 1-2 tablets (500-1,000 mg total) by mouth every 6 (six) hours as needed. What changed: reasons to take this   aspirin EC 81 MG tablet Take 1 tablet (81 mg total) by mouth daily.  Swallow whole.   cholecalciferol 25 MCG (1000 UNIT) tablet Commonly known as: VITAMIN D3 Take 1,000 Units by mouth daily.   diphenhydrAMINE 25 mg capsule Commonly known as: BENADRYL Take 50 mg by mouth every 6 (six) hours as needed for itching or allergies.   dorzolamide-timolol 22.3-6.8 MG/ML ophthalmic solution Commonly known as: COSOPT Place 1 drop into both eyes 2 (two) times daily.   EYE HEALTH + LUTEIN PO Take 1 tablet by mouth daily.   FeroSul 325 (65 FE) MG  tablet Generic drug: ferrous sulfate Take 1 tablet (325 mg total) by mouth 2 (two) times daily.   furosemide 40 MG tablet Commonly known as: LASIX Take 40 mg by mouth daily.   hydrOXYzine 25 MG tablet Commonly known as: ATARAX Take 25 mg by mouth 4 (four) times daily as needed for itching (allergies).   latanoprost 0.005 % ophthalmic solution Commonly known as: XALATAN Place 1 drop into the right eye at bedtime.   nitroGLYCERIN 0.4 MG SL tablet Commonly known as: NITROSTAT Place 0.4 mg under the tongue every 5 (five) minutes as needed for chest pain.   pantoprazole 40 MG tablet Commonly known as: Protonix Take 1 tablet (40 mg) twice a day x8 weeks, followed by 1 tablet (40 mg) once a day   PHENobarbital 32.4 MG tablet Commonly known as: LUMINAL Take 64.8 mg by mouth at bedtime.   phenytoin 100 MG ER capsule Commonly known as: DILANTIN Take 200 mg by mouth at bedtime.   risperiDONE 1 MG tablet Commonly known as: RISPERDAL Take 1 mg by mouth at bedtime.   tadalafil 5 MG tablet Commonly known as: CIALIS Take 5 mg by mouth daily.   VITAMIN B 12 PO Take 1 tablet by mouth daily.   ZINC PO Take 1 tablet by mouth daily.        Allergies:  Allergies  Allergen Reactions   Phenergan [Promethazine] Itching   Zofran [Ondansetron] Itching    Family History  Problem Relation Age of Onset   Diabetes Father    Diabetes Sister     Social History:  reports that he quit smoking about 2 years ago. His smoking use included cigarettes. He has a 100.00 pack-year smoking history. He has never used smokeless tobacco. He reports that he does not currently use alcohol. He reports that he does not use drugs.  ROS: A complete review of systems was performed.  All systems are negative except for pertinent findings as noted.  Physical Exam:  Vital signs in last 24 hours: BP 135/68   Pulse 70   Ht 5\' 11"  (1.803 m)   Wt 230 lb (104.3 kg)   BMI 32.08 kg/m  Constitutional:   Alert and oriented, No acute distress Cardiovascular: Regular rate  Respiratory: Normal respiratory effort Neurologic: Grossly intact, no focal deficits Psychiatric: Normal mood and affect  I have reviewed prior pt notes  I have reviewed notes from referring/previous physicians-Hospital records reviewed  I have reviewed urinalysis results  I have independently reviewed prior imaging-PET scan reviewed  I have reviewed prior PSA results-most recently 6.97     Impression/Assessment:  1.  Elevated PSA, 5.5-6 0.97 over 2-year period.  Combined with bilateral prostate nodularity, this is worrisome for prostate cancer  2.  Abnormal findings on PET scan  Plan:  1.  I discussed with the patient and his daughter that this may well be prostate cancer  2.  I discussed proceeding with ultrasound and biopsy of the prostate, which he refused  to do back in September 2021.  He is still does not want to proceed  3.  I will let him call me should he decide to proceed at any point.  I also discussed with him the fact that if he is not going to proceed with ultrasound and biopsy that he should not have PSA studies anymore

## 2021-12-28 DIAGNOSIS — I5032 Chronic diastolic (congestive) heart failure: Secondary | ICD-10-CM | POA: Diagnosis not present

## 2021-12-28 DIAGNOSIS — L120 Bullous pemphigoid: Secondary | ICD-10-CM | POA: Diagnosis not present

## 2021-12-28 DIAGNOSIS — E785 Hyperlipidemia, unspecified: Secondary | ICD-10-CM | POA: Diagnosis not present

## 2021-12-28 DIAGNOSIS — I110 Hypertensive heart disease with heart failure: Secondary | ICD-10-CM | POA: Diagnosis not present

## 2021-12-28 DIAGNOSIS — G40909 Epilepsy, unspecified, not intractable, without status epilepticus: Secondary | ICD-10-CM | POA: Diagnosis not present

## 2021-12-28 DIAGNOSIS — M8008XS Age-related osteoporosis with current pathological fracture, vertebra(e), sequela: Secondary | ICD-10-CM | POA: Diagnosis not present

## 2021-12-28 DIAGNOSIS — J449 Chronic obstructive pulmonary disease, unspecified: Secondary | ICD-10-CM | POA: Diagnosis not present

## 2021-12-28 DIAGNOSIS — I25118 Atherosclerotic heart disease of native coronary artery with other forms of angina pectoris: Secondary | ICD-10-CM | POA: Diagnosis not present

## 2021-12-31 DIAGNOSIS — I110 Hypertensive heart disease with heart failure: Secondary | ICD-10-CM | POA: Diagnosis not present

## 2021-12-31 DIAGNOSIS — I2510 Atherosclerotic heart disease of native coronary artery without angina pectoris: Secondary | ICD-10-CM | POA: Diagnosis not present

## 2021-12-31 DIAGNOSIS — G40909 Epilepsy, unspecified, not intractable, without status epilepticus: Secondary | ICD-10-CM | POA: Diagnosis not present

## 2021-12-31 DIAGNOSIS — C3490 Malignant neoplasm of unspecified part of unspecified bronchus or lung: Secondary | ICD-10-CM | POA: Diagnosis not present

## 2021-12-31 DIAGNOSIS — M8008XS Age-related osteoporosis with current pathological fracture, vertebra(e), sequela: Secondary | ICD-10-CM | POA: Diagnosis not present

## 2021-12-31 DIAGNOSIS — D485 Neoplasm of uncertain behavior of skin: Secondary | ICD-10-CM | POA: Diagnosis not present

## 2021-12-31 DIAGNOSIS — L120 Bullous pemphigoid: Secondary | ICD-10-CM | POA: Diagnosis not present

## 2021-12-31 DIAGNOSIS — N400 Enlarged prostate without lower urinary tract symptoms: Secondary | ICD-10-CM | POA: Diagnosis not present

## 2021-12-31 DIAGNOSIS — E559 Vitamin D deficiency, unspecified: Secondary | ICD-10-CM | POA: Diagnosis not present

## 2021-12-31 DIAGNOSIS — E669 Obesity, unspecified: Secondary | ICD-10-CM | POA: Diagnosis not present

## 2021-12-31 DIAGNOSIS — C189 Malignant neoplasm of colon, unspecified: Secondary | ICD-10-CM | POA: Diagnosis not present

## 2021-12-31 DIAGNOSIS — Z87891 Personal history of nicotine dependence: Secondary | ICD-10-CM | POA: Diagnosis not present

## 2021-12-31 DIAGNOSIS — J301 Allergic rhinitis due to pollen: Secondary | ICD-10-CM | POA: Diagnosis not present

## 2021-12-31 DIAGNOSIS — I252 Old myocardial infarction: Secondary | ICD-10-CM | POA: Diagnosis not present

## 2021-12-31 DIAGNOSIS — Z9049 Acquired absence of other specified parts of digestive tract: Secondary | ICD-10-CM | POA: Diagnosis not present

## 2021-12-31 DIAGNOSIS — J449 Chronic obstructive pulmonary disease, unspecified: Secondary | ICD-10-CM | POA: Diagnosis not present

## 2021-12-31 DIAGNOSIS — E782 Mixed hyperlipidemia: Secondary | ICD-10-CM | POA: Diagnosis not present

## 2021-12-31 DIAGNOSIS — I5032 Chronic diastolic (congestive) heart failure: Secondary | ICD-10-CM | POA: Diagnosis not present

## 2022-01-04 DIAGNOSIS — G40909 Epilepsy, unspecified, not intractable, without status epilepticus: Secondary | ICD-10-CM | POA: Diagnosis not present

## 2022-01-04 DIAGNOSIS — J449 Chronic obstructive pulmonary disease, unspecified: Secondary | ICD-10-CM | POA: Diagnosis not present

## 2022-01-04 DIAGNOSIS — L120 Bullous pemphigoid: Secondary | ICD-10-CM | POA: Diagnosis not present

## 2022-01-04 DIAGNOSIS — M8008XS Age-related osteoporosis with current pathological fracture, vertebra(e), sequela: Secondary | ICD-10-CM | POA: Diagnosis not present

## 2022-01-04 DIAGNOSIS — I5032 Chronic diastolic (congestive) heart failure: Secondary | ICD-10-CM | POA: Diagnosis not present

## 2022-01-04 DIAGNOSIS — I110 Hypertensive heart disease with heart failure: Secondary | ICD-10-CM | POA: Diagnosis not present

## 2022-01-09 DIAGNOSIS — L120 Bullous pemphigoid: Secondary | ICD-10-CM | POA: Diagnosis not present

## 2022-01-09 DIAGNOSIS — M8008XS Age-related osteoporosis with current pathological fracture, vertebra(e), sequela: Secondary | ICD-10-CM | POA: Diagnosis not present

## 2022-01-09 DIAGNOSIS — I5032 Chronic diastolic (congestive) heart failure: Secondary | ICD-10-CM | POA: Diagnosis not present

## 2022-01-09 DIAGNOSIS — I110 Hypertensive heart disease with heart failure: Secondary | ICD-10-CM | POA: Diagnosis not present

## 2022-01-09 DIAGNOSIS — G40909 Epilepsy, unspecified, not intractable, without status epilepticus: Secondary | ICD-10-CM | POA: Diagnosis not present

## 2022-01-09 DIAGNOSIS — J449 Chronic obstructive pulmonary disease, unspecified: Secondary | ICD-10-CM | POA: Diagnosis not present

## 2022-01-09 DIAGNOSIS — C189 Malignant neoplasm of colon, unspecified: Secondary | ICD-10-CM | POA: Diagnosis not present

## 2022-01-09 DIAGNOSIS — I2510 Atherosclerotic heart disease of native coronary artery without angina pectoris: Secondary | ICD-10-CM | POA: Diagnosis not present

## 2022-01-10 DIAGNOSIS — L120 Bullous pemphigoid: Secondary | ICD-10-CM | POA: Diagnosis not present

## 2022-01-11 DIAGNOSIS — I5032 Chronic diastolic (congestive) heart failure: Secondary | ICD-10-CM | POA: Diagnosis not present

## 2022-01-11 DIAGNOSIS — J449 Chronic obstructive pulmonary disease, unspecified: Secondary | ICD-10-CM | POA: Diagnosis not present

## 2022-01-11 DIAGNOSIS — M8008XS Age-related osteoporosis with current pathological fracture, vertebra(e), sequela: Secondary | ICD-10-CM | POA: Diagnosis not present

## 2022-01-11 DIAGNOSIS — L120 Bullous pemphigoid: Secondary | ICD-10-CM | POA: Diagnosis not present

## 2022-01-11 DIAGNOSIS — G40909 Epilepsy, unspecified, not intractable, without status epilepticus: Secondary | ICD-10-CM | POA: Diagnosis not present

## 2022-01-11 DIAGNOSIS — I110 Hypertensive heart disease with heart failure: Secondary | ICD-10-CM | POA: Diagnosis not present

## 2022-01-17 DIAGNOSIS — L120 Bullous pemphigoid: Secondary | ICD-10-CM | POA: Diagnosis not present

## 2022-01-17 DIAGNOSIS — R21 Rash and other nonspecific skin eruption: Secondary | ICD-10-CM | POA: Diagnosis not present

## 2022-01-17 DIAGNOSIS — Z79899 Other long term (current) drug therapy: Secondary | ICD-10-CM | POA: Diagnosis not present

## 2022-01-17 DIAGNOSIS — Z5181 Encounter for therapeutic drug level monitoring: Secondary | ICD-10-CM | POA: Diagnosis not present

## 2022-01-18 DIAGNOSIS — J449 Chronic obstructive pulmonary disease, unspecified: Secondary | ICD-10-CM | POA: Diagnosis not present

## 2022-01-18 DIAGNOSIS — G40909 Epilepsy, unspecified, not intractable, without status epilepticus: Secondary | ICD-10-CM | POA: Diagnosis not present

## 2022-01-18 DIAGNOSIS — L120 Bullous pemphigoid: Secondary | ICD-10-CM | POA: Diagnosis not present

## 2022-01-18 DIAGNOSIS — I5032 Chronic diastolic (congestive) heart failure: Secondary | ICD-10-CM | POA: Diagnosis not present

## 2022-01-18 DIAGNOSIS — M8008XS Age-related osteoporosis with current pathological fracture, vertebra(e), sequela: Secondary | ICD-10-CM | POA: Diagnosis not present

## 2022-01-18 DIAGNOSIS — I110 Hypertensive heart disease with heart failure: Secondary | ICD-10-CM | POA: Diagnosis not present

## 2022-01-24 DIAGNOSIS — L120 Bullous pemphigoid: Secondary | ICD-10-CM | POA: Diagnosis not present

## 2022-01-24 DIAGNOSIS — M8008XS Age-related osteoporosis with current pathological fracture, vertebra(e), sequela: Secondary | ICD-10-CM | POA: Diagnosis not present

## 2022-01-24 DIAGNOSIS — G40909 Epilepsy, unspecified, not intractable, without status epilepticus: Secondary | ICD-10-CM | POA: Diagnosis not present

## 2022-01-24 DIAGNOSIS — I5032 Chronic diastolic (congestive) heart failure: Secondary | ICD-10-CM | POA: Diagnosis not present

## 2022-01-24 DIAGNOSIS — J449 Chronic obstructive pulmonary disease, unspecified: Secondary | ICD-10-CM | POA: Diagnosis not present

## 2022-01-24 DIAGNOSIS — I110 Hypertensive heart disease with heart failure: Secondary | ICD-10-CM | POA: Diagnosis not present

## 2022-02-01 DIAGNOSIS — H01002 Unspecified blepharitis right lower eyelid: Secondary | ICD-10-CM | POA: Diagnosis not present

## 2022-02-01 DIAGNOSIS — H01001 Unspecified blepharitis right upper eyelid: Secondary | ICD-10-CM | POA: Diagnosis not present

## 2022-02-01 DIAGNOSIS — H401112 Primary open-angle glaucoma, right eye, moderate stage: Secondary | ICD-10-CM | POA: Diagnosis not present

## 2022-02-01 DIAGNOSIS — H401123 Primary open-angle glaucoma, left eye, severe stage: Secondary | ICD-10-CM | POA: Diagnosis not present

## 2022-02-15 DIAGNOSIS — H40051 Ocular hypertension, right eye: Secondary | ICD-10-CM | POA: Diagnosis not present

## 2022-02-15 DIAGNOSIS — H401112 Primary open-angle glaucoma, right eye, moderate stage: Secondary | ICD-10-CM | POA: Diagnosis not present

## 2022-02-15 DIAGNOSIS — H401123 Primary open-angle glaucoma, left eye, severe stage: Secondary | ICD-10-CM | POA: Diagnosis not present

## 2022-02-20 DIAGNOSIS — D485 Neoplasm of uncertain behavior of skin: Secondary | ICD-10-CM | POA: Diagnosis not present

## 2022-02-20 DIAGNOSIS — J449 Chronic obstructive pulmonary disease, unspecified: Secondary | ICD-10-CM | POA: Diagnosis not present

## 2022-02-20 DIAGNOSIS — Z6835 Body mass index (BMI) 35.0-35.9, adult: Secondary | ICD-10-CM | POA: Diagnosis not present

## 2022-02-20 DIAGNOSIS — E559 Vitamin D deficiency, unspecified: Secondary | ICD-10-CM | POA: Diagnosis not present

## 2022-02-20 DIAGNOSIS — G40909 Epilepsy, unspecified, not intractable, without status epilepticus: Secondary | ICD-10-CM | POA: Diagnosis not present

## 2022-02-20 DIAGNOSIS — I25119 Atherosclerotic heart disease of native coronary artery with unspecified angina pectoris: Secondary | ICD-10-CM | POA: Diagnosis not present

## 2022-02-20 DIAGNOSIS — E7849 Other hyperlipidemia: Secondary | ICD-10-CM | POA: Diagnosis not present

## 2022-02-20 DIAGNOSIS — M810 Age-related osteoporosis without current pathological fracture: Secondary | ICD-10-CM | POA: Diagnosis not present

## 2022-02-20 DIAGNOSIS — L120 Bullous pemphigoid: Secondary | ICD-10-CM | POA: Diagnosis not present

## 2022-02-20 DIAGNOSIS — F17210 Nicotine dependence, cigarettes, uncomplicated: Secondary | ICD-10-CM | POA: Diagnosis not present

## 2022-02-20 DIAGNOSIS — C3490 Malignant neoplasm of unspecified part of unspecified bronchus or lung: Secondary | ICD-10-CM | POA: Diagnosis not present

## 2022-02-20 DIAGNOSIS — I5032 Chronic diastolic (congestive) heart failure: Secondary | ICD-10-CM | POA: Diagnosis not present

## 2022-03-12 DIAGNOSIS — H401112 Primary open-angle glaucoma, right eye, moderate stage: Secondary | ICD-10-CM | POA: Diagnosis not present

## 2022-03-12 DIAGNOSIS — H401123 Primary open-angle glaucoma, left eye, severe stage: Secondary | ICD-10-CM | POA: Diagnosis not present

## 2022-03-15 ENCOUNTER — Other Ambulatory Visit: Payer: Self-pay

## 2022-03-15 ENCOUNTER — Ambulatory Visit (INDEPENDENT_AMBULATORY_CARE_PROVIDER_SITE_OTHER): Payer: Medicare Other | Admitting: General Surgery

## 2022-03-15 ENCOUNTER — Encounter: Payer: Self-pay | Admitting: General Surgery

## 2022-03-15 VITALS — BP 124/64 | HR 59 | Temp 98.3°F | Resp 18 | Ht 71.0 in | Wt 240.0 lb

## 2022-03-15 DIAGNOSIS — R1084 Generalized abdominal pain: Secondary | ICD-10-CM

## 2022-03-15 DIAGNOSIS — C3491 Malignant neoplasm of unspecified part of right bronchus or lung: Secondary | ICD-10-CM

## 2022-03-15 DIAGNOSIS — R140 Abdominal distension (gaseous): Secondary | ICD-10-CM

## 2022-03-15 DIAGNOSIS — I2510 Atherosclerotic heart disease of native coronary artery without angina pectoris: Secondary | ICD-10-CM | POA: Diagnosis not present

## 2022-03-15 NOTE — Progress Notes (Signed)
Alan Melendez; [id number]; 03-11-1941   HPI Patient is a 81 year old white male who was referred to my care by Kern Alberta for evaluation and treatment of abdominal distention.  Patient has been having intermittent abdominal pain that he states is due to his increased girth.  He has been having this issue ever since he had a colon resection performed at [facility].  He thinks it is due to air being instilled into his abdomen and that the area is still there and is progressively getting worse.  The distention and discomfort is chronic in nature.  He thinks it extends to his back and is worried that the area will extend up to his chest wall skin and neck.  He denies any fever or chills.  He is seeing Dr. Delton Coombes of oncology concerning the lung cancer.  I do not have records as to the exact procedure performed at [facility].  His appetite is within normal limits.  He denies any blood in his stools. Past Medical History:  Diagnosis Date   Anxiety    Cataracts, bilateral    Colon cancer ([place])    Depression    Epilepsy ([place])    Glaucoma    Heart attack ([place])    History of kidney stones     Past Surgical History:  Procedure Laterality Date   APPENDECTOMY     BIOPSY  08/23/2021   Procedure: BIOPSY;  Surgeon: Juanita Craver, MD;  Location: [facility] ENDOSCOPY;  Service: Gastroenterology;;   CHOLECYSTECTOMY     COLONOSCOPY WITH PROPOFOL N/A 03/21/2021   Procedure: COLONOSCOPY WITH PROPOFOL;  Surgeon: Harvel Quale, MD;  [facility];  Service: Gastroenterology;  Laterality: N/A;  1:45, pt knows to arrive at 9:30   coloonscopy     CORONARY ANGIOPLASTY     ESOPHAGOGASTRODUODENOSCOPY (EGD) WITH PROPOFOL N/A 08/23/2021   Procedure: ESOPHAGOGASTRODUODENOSCOPY (EGD) WITH PROPOFOL;  Surgeon: Juanita Craver, MD;  Location: [facility] ENDOSCOPY;  Service: Gastroenterology;  Laterality: N/A;  MELENA AND IDA   HEMOSTASIS CLIP PLACEMENT  03/21/2021   Procedure: HEMOSTASIS CLIP PLACEMENT;   Surgeon: Harvel Quale, MD;  [facility];  Service: Gastroenterology;;   INTERCOSTAL NERVE BLOCK Right 07/27/2021   Procedure: INTERCOSTAL NERVE BLOCK;  Surgeon: [name], MD;  Location: [place];  Service: Thoracic;  Laterality: Right;   LYMPH NODE DISSECTION Right 07/27/2021   Procedure: LYMPH NODE DISSECTION;  Surgeon: [name], MD;  Location: [place];  Service: Thoracic;  Laterality: Right;   POLYPECTOMY  03/21/2021   Procedure: POLYPECTOMY;  Surgeon: Harvel Quale, MD;  [facility];  Service: Gastroenterology;;   SUBMUCOSAL TATTOO INJECTION  03/21/2021   Procedure: SUBMUCOSAL TATTOO INJECTION;  Surgeon: Montez Morita, Quillian Quince, MD;  [facility];  Service: Gastroenterology;;    Family History  Problem Relation Age of Onset   Diabetes Father    Diabetes Sister     Current Outpatient Medications on File Prior to Visit  Medication Sig Dispense Refill   acetaminophen (TYLENOL) 500 MG tablet Take 1-2 tablets (500-1,000 mg total) by mouth every 6 (six) hours as needed. (Patient taking differently: Take 500-1,000 mg by mouth every 6 (six) hours as needed for mild pain or headache.) 30 tablet 0   cholecalciferol (VITAMIN D3) 25 MCG (1000 UNIT) tablet Take 1,000 Units by mouth daily.     dorzolamide-timolol (COSOPT) 22.3-6.8 MG/ML ophthalmic solution Place 1 drop into both eyes 2 (two) times daily.  ferrous sulfate 325 (65 FE) MG tablet Take 1 tablet (325 mg total) by mouth 2 (two) times daily. 60 tablet 3   furosemide (LASIX) 40 MG tablet Take 40 mg by mouth daily.     latanoprost (XALATAN) 0.005 % ophthalmic solution Place 1 drop into the right eye at bedtime.     Multiple Vitamins-Minerals (EYE HEALTH + LUTEIN PO) Take 1 tablet by mouth daily.     Multiple Vitamins-Minerals (ZINC PO) Take 1 tablet by mouth daily.     nitroGLYCERIN (NITROSTAT) 0.4 MG SL tablet Place 0.4 mg under the tongue every 5 (five)  minutes as needed for chest pain.     pantoprazole (PROTONIX) 40 MG tablet Take 1 tablet (40 mg) twice a day x8 weeks, followed by 1 tablet (40 mg) once a day 60 tablet 11   PHENobarbital (LUMINAL) 32.4 MG tablet Take 64.8 mg by mouth at bedtime.     phenytoin (DILANTIN) 100 MG ER capsule Take 200 mg by mouth at bedtime.     tadalafil (CIALIS) 5 MG tablet Take 5 mg by mouth daily.     No current facility-administered medications on file prior to visit.    Allergies  Allergen Reactions   Erythromycin Base Other (See Comments)   Ondansetron Itching   Promethazine Itching    Social History   Substance and Sexual Activity  Alcohol Use Not Currently    Social History   Tobacco Use  Smoking Status Former   Packs/day: 2.00   Years: 50.00   Total pack years: 100.00   Types: Cigarettes   Quit date: 04/12/2019   Years since quitting: 2.9  Smokeless Tobacco Never    Review of Systems  Constitutional: Negative.   HENT: Negative.    Eyes: Negative.   Respiratory:  Positive for cough.   Cardiovascular: Negative.   Gastrointestinal:  Positive for abdominal pain.  Genitourinary: Negative.   Musculoskeletal: Negative.   Skin: Negative.   Neurological: Negative.   Endo/Heme/Allergies: Negative.   Psychiatric/Behavioral: Negative.      Objective   Vitals:   03/15/22 1037  BP: 124/64  Pulse: (!) 59  Resp: 18  Temp: 98.3 F (36.8 C)  SpO2: 97%    Physical Exam Vitals reviewed.  Constitutional:      Appearance: Normal appearance. He is not ill-appearing.  HENT:     Head: Normocephalic and atraumatic.  Cardiovascular:     Rate and Rhythm: Normal rate and regular rhythm.     Heart sounds: Normal heart sounds. No murmur heard.    No friction rub. No gallop.  Pulmonary:     Effort: Pulmonary effort is normal. No respiratory distress.     Breath sounds: Normal breath sounds. No stridor. No wheezing, rhonchi or rales.  Abdominal:     General: There is distension.      Palpations: Abdomen is soft. There is no mass.     Tenderness: There is no abdominal tenderness. There is no guarding or rebound.     Hernia: No hernia is present.     Comments: Patient has a rotund abdomen.  He has a well-healed upper midline surgical scar.  He does seem to have some laxity of the abdominal wall that is related to his overall abdominal girth.  I do not feel a distinct incisional hernia.  No rigidity is noted.  Skin:    General: Skin is warm and dry.  Neurological:     Mental Status: He is alert and oriented to person, place,  and time.     Assessment  Abdominal wall distention.  It is difficult to a certain whether he has a discrete hernia, though I think his abdominal girth is causing laxity of the abdominal wall.  The patient is insistent that he still has trapped air within the abdominal cavity that is traveling underneath the skin throughout his body.  I tried to reassure him that this is not the case.  There is nothing acutely surgical that I can offer at the present time. Plan  Patient is scheduled to get a CT scan of the chest for follow-up of his lung cancer.  I have contacted the [facility] and they will add an abdominal and pelvic CT to his chest CT.  Further management is pending those results.  This was explained to the patient and family member.

## 2022-03-20 DIAGNOSIS — L120 Bullous pemphigoid: Secondary | ICD-10-CM | POA: Diagnosis not present

## 2022-03-29 DIAGNOSIS — H6122 Impacted cerumen, left ear: Secondary | ICD-10-CM | POA: Diagnosis not present

## 2022-03-29 DIAGNOSIS — H6121 Impacted cerumen, right ear: Secondary | ICD-10-CM | POA: Diagnosis not present

## 2022-03-29 DIAGNOSIS — H9190 Unspecified hearing loss, unspecified ear: Secondary | ICD-10-CM | POA: Diagnosis not present

## 2022-04-10 ENCOUNTER — Ambulatory Visit (HOSPITAL_COMMUNITY)
Admission: RE | Admit: 2022-04-10 | Discharge: 2022-04-10 | Disposition: A | Discharge status: Home / Self Care | Payer: Medicare Other | Source: Ambulatory Visit | Attending: Hematology | Admitting: Hematology

## 2022-04-10 ENCOUNTER — Inpatient Hospital Stay: Payer: Medicare Other | Attending: Hematology

## 2022-04-10 DIAGNOSIS — R1084 Generalized abdominal pain: Secondary | ICD-10-CM | POA: Insufficient documentation

## 2022-04-10 DIAGNOSIS — R140 Abdominal distension (gaseous): Secondary | ICD-10-CM | POA: Diagnosis not present

## 2022-04-10 DIAGNOSIS — R918 Other nonspecific abnormal finding of lung field: Secondary | ICD-10-CM | POA: Diagnosis not present

## 2022-04-10 DIAGNOSIS — I5032 Chronic diastolic (congestive) heart failure: Secondary | ICD-10-CM | POA: Diagnosis not present

## 2022-04-10 DIAGNOSIS — C3491 Malignant neoplasm of unspecified part of right bronchus or lung: Secondary | ICD-10-CM | POA: Diagnosis not present

## 2022-04-10 DIAGNOSIS — E782 Mixed hyperlipidemia: Secondary | ICD-10-CM | POA: Diagnosis not present

## 2022-04-10 DIAGNOSIS — C3490 Malignant neoplasm of unspecified part of unspecified bronchus or lung: Secondary | ICD-10-CM | POA: Diagnosis not present

## 2022-04-10 LAB — POCT I-STAT CREATININE: Creatinine, Ser: 0.9 mg/dL (ref 0.61–1.24)

## 2022-04-10 MED ORDER — IOHEXOL 9 MG/ML PO SOLN
ORAL | Status: AC
  Filled 2022-04-10: qty 1000

## 2022-04-10 MED ORDER — IOHEXOL 300 MG/ML  SOLN
100.0000 mL | Freq: Once | INTRAMUSCULAR | Status: AC | PRN
  Administered 2022-04-10: 100 mL via INTRAVENOUS

## 2022-04-16 ENCOUNTER — Inpatient Hospital Stay: Payer: Medicare Other | Attending: Hematology | Admitting: Hematology

## 2022-04-16 VITALS — BP 138/79 | HR 70 | Temp 97.4°F | Resp 18 | Ht 69.5 in | Wt 237.6 lb

## 2022-04-16 DIAGNOSIS — C3411 Malignant neoplasm of upper lobe, right bronchus or lung: Secondary | ICD-10-CM | POA: Insufficient documentation

## 2022-04-16 DIAGNOSIS — Z87891 Personal history of nicotine dependence: Secondary | ICD-10-CM | POA: Insufficient documentation

## 2022-04-16 DIAGNOSIS — C3491 Malignant neoplasm of unspecified part of right bronchus or lung: Secondary | ICD-10-CM

## 2022-04-16 NOTE — Patient Instructions (Signed)
[facility]  Discharge Instructions  You were seen and examined today by Dr. Delton Coombes.  Dr. Delton Coombes discussed your most recent lab work and CT scan which revealed that you have two spots that have grown on your lungs.  Dr. Delton Coombes has recommended having a PET scan to see whether they are malignant.  Follow-up as scheduled in 4 months.    Thank you for choosing [facility] to provide your oncology and hematology care.   To afford each patient quality time with our provider, please arrive at least 15 minutes before your scheduled appointment time. You may need to reschedule your appointment if you arrive late (10 or more minutes). Arriving late affects you and other patients whose appointments are after yours.  Also, if you miss three or more appointments without notifying the office, you may be dismissed from the clinic at the provider's discretion.    Again, thank you for choosing [facility].  Our hope is that these requests will decrease the amount of time that you wait before being seen by our physicians.   If you have a lab appointment with the [facility] please come in thru the Main Entrance and check in at the main information desk.           _____________________________________________________________  Should you have questions after your visit to [facility], please contact our office at [phone] and follow the prompts.  Our office hours are 8:00 a.m. to 4:30 p.m. Monday - Thursday and 8:00 a.m. to 2:30 p.m. Friday.  Please note that voicemails left after 4:00 p.m. may not be returned until the following business day.  We are closed weekends and all major holidays.  You do have access to a nurse 24-7, just call the main number to the clinic [phone] and do not press any options, hold on the line and a nurse will answer the phone.    For prescription refill requests, have your  pharmacy contact our office and allow 72 hours.    Masks are optional in the cancer centers. If you would like for your care team to wear a mask while they are taking care of you, please let them know. You may have one support person who is at least 79 years old accompany you for your appointments.

## 2022-04-16 NOTE — Progress Notes (Signed)
Alan Melendez, [place] [address]   CLINIC:  Medical Oncology/Hematology  PCP:  [name], MD [address] [place] [address] [phone]   REASON FOR VISIT:  Follow-up for lung cancer  PRIOR THERAPY: Right upper lobectomy 07/27/2021  NGS Results: not done  CURRENT THERAPY: under work-up  BRIEF ONCOLOGIC HISTORY:  Oncology History  Adenocarcinoma of right lung ([place])  09/06/2021 Initial Diagnosis   Adenocarcinoma of right lung ([place])   09/06/2021 Cancer Staging   Staging form: Lung, AJCC 8th Edition - Clinical stage from 09/06/2021: Stage IB (cT2a, cN0, cM0) - Signed by Derek Jack, MD on 09/06/2021 Histopathologic type: Adenocarcinoma, NOS     CANCER STAGING:  Cancer Staging  Adenocarcinoma of right lung [facility]) Staging form: Lung, AJCC 8th Edition - Clinical stage from 09/06/2021: Stage IB (cT2a, cN0, cM0) - Signed by Derek Jack, MD on 09/06/2021   INTERVAL HISTORY:  Alan Melendez, a 81 y.o. male, seen for follow-up of lung cancer.  He denies any recent infections or hospitalizations.  Chronic cough and shortness of breath are stable.   REVIEW OF SYSTEMS:  Review of Systems  Constitutional:  Negative for appetite change, fatigue and unexpected weight change.  Respiratory:  Positive for cough and shortness of breath.   Gastrointestinal:  Negative for blood in stool.  Neurological:  Positive for numbness (In the feet).  All other systems reviewed and are negative.   PAST MEDICAL/SURGICAL HISTORY:  Past Medical History:  Diagnosis Date   Anxiety    Cataracts, bilateral    Colon cancer ([place])    Depression    Epilepsy ([place])    Glaucoma    Heart attack ([place])    History of kidney stones    Past Surgical History:  Procedure Laterality Date   APPENDECTOMY     BIOPSY  08/23/2021   Procedure: BIOPSY;  Surgeon: Juanita Craver, MD;  Location: [facility] ENDOSCOPY;  Service: Gastroenterology;;   CHOLECYSTECTOMY      COLONOSCOPY WITH PROPOFOL N/A 03/21/2021   Procedure: COLONOSCOPY WITH PROPOFOL;  Surgeon: Harvel Quale, MD;  Location[facility] ENDO SU[facility];  Service: Gastroenterology;  Laterality: N/A;  1:45, pt knows to arrive at 9:30   coloonscopy     CORONARY ANGIOPLASTY     ESOPHAGOGASTRODUODENOSCOPY (EGD) WITH PROPOFOL N/A 08/23/2021   Procedure: ESOPHAGOGASTRODUODENOSCOPY (EGD) WITH PROPOFOL;  Surgeon: Juanita Craver, MD;  Location: [facility] ENDOSCOPY;  Service: Gastroenterology;  Laterality: N/A;  MELENA AND IDA   HEMOSTASIS CLIP PLACEMENT  03/21/2021   Procedure: HEMOSTASIS CLIP PLACEMENT;  Surgeon: Harvel Quale, MD;  Location: [facility];  Service: Gastroenterology;;   INTERCOSTAL NERVE BLOCK Right 07/27/2021   Procedure: INTERCOSTAL NERVE BLOCK;  Surgeon: [name], MD;  Location: [place];  Service: Thoracic;  Laterality: Right;   LYMPH NODE DISSECTION Right 07/27/2021   Procedure: LYMPH NODE DISSECTION;  Surgeon: [name], MD;  Location: [place];  Service: Thoracic;  Laterality: Right;   POLYPECTOMY  03/21/2021   Procedure: POLYPECTOMY;  Surgeon: Harvel Quale, MD;  [facility];  Service: Gastroenterology;;   SUBMUCOSAL TATTOO INJECTION  03/21/2021   Procedure: SUBMUCOSAL TATTOO INJECTION;  Surgeon: Harvel Quale, MD;  [facility];  Service: Gastroenterology;;    SOCIAL HISTORY:  Social History   Socioeconomic History   Marital status: Widowed    Spouse name: Not on file   Number of children: 2   Years of education:  Not on file   Highest education level: Not on file  Occupational History   Not on file  Tobacco Use   Smoking status: Former    Packs/day: 2.00    Years: 50.00    Total pack years: 100.00    Types: Cigarettes    Quit date: 04/12/2019    Years since quitting: 3.0   Smokeless tobacco: Never  Vaping Use   Vaping Use: Never used  Substance and Sexual Activity   Alcohol use: Not Currently    Drug use: Never   Sexual activity: Not on file  Other Topics Concern   Not on file  Social History Narrative   Not on file   Social Determinants of Health   Financial Resource Strain: Not on file  Food Insecurity: Not on file  Transportation Needs: Not on file  Physical Activity: Not on file  Stress: Not on file  Social Connections: Not on file  Intimate Partner Violence: Not on file    FAMILY HISTORY:  Family History  Problem Relation Age of Onset   Diabetes Father    Diabetes Sister     CURRENT MEDICATIONS:  Current Outpatient Medications  Medication Sig Dispense Refill   acetaminophen (TYLENOL) 500 MG tablet Take 1-2 tablets (500-1,000 mg total) by mouth every 6 (six) hours as needed. (Patient taking differently: Take 500-1,000 mg by mouth every 6 (six) hours as needed for mild pain or headache.) 30 tablet 0   cholecalciferol (VITAMIN D3) 25 MCG (1000 UNIT) tablet Take 1,000 Units by mouth daily.     dorzolamide-timolol (COSOPT) 22.3-6.8 MG/ML ophthalmic solution Place 1 drop into both eyes 2 (two) times daily.     ferrous sulfate 325 (65 FE) MG tablet Take 1 tablet (325 mg total) by mouth 2 (two) times daily. 60 tablet 3   furosemide (LASIX) 40 MG tablet Take 40 mg by mouth daily.     latanoprost (XALATAN) 0.005 % ophthalmic solution Place 1 drop into the right eye at bedtime.     Multiple Vitamins-Minerals (EYE HEALTH + LUTEIN PO) Take 1 tablet by mouth daily.     Multiple Vitamins-Minerals (ZINC PO) Take 1 tablet by mouth daily.     nitroGLYCERIN (NITROSTAT) 0.4 MG SL tablet Place 0.4 mg under the tongue every 5 (five) minutes as needed for chest pain.     pantoprazole (PROTONIX) 40 MG tablet Take 1 tablet (40 mg) twice a day x8 weeks, followed by 1 tablet (40 mg) once a day 60 tablet 11   PHENobarbital (LUMINAL) 32.4 MG tablet Take 64.8 mg by mouth at bedtime.     phenytoin (DILANTIN) 100 MG ER capsule Take 200 mg by mouth at bedtime.     tadalafil (CIALIS) 5 MG tablet  Take 5 mg by mouth daily.     No current facility-administered medications for this visit.    ALLERGIES:  Allergies  Allergen Reactions   Erythromycin Base Other (See Comments)   Ondansetron Itching   Promethazine Itching    PHYSICAL EXAM:  Performance status (ECOG): 1 - Symptomatic but completely ambulatory  Vitals:   04/16/22 1419  BP: 138/79  Pulse: 70  Resp: 18  Temp: (!) 97.4 F (36.3 C)  SpO2: 97%   Wt Readings from Last 3 Encounters:  04/16/22 237 lb 9.6 oz (107.8 kg)  03/15/22 240 lb (108.9 kg)  12/26/21 230 lb (104.3 kg)   Physical Exam Vitals reviewed.  Constitutional:      Appearance: Normal appearance. He is obese.  Cardiovascular:     Rate and Rhythm: Normal rate and regular rhythm.     Pulses: Normal pulses.     Heart sounds: Normal heart sounds.  Pulmonary:     Effort: Pulmonary effort is normal.     Breath sounds: Normal breath sounds.  Neurological:     General: No focal deficit present.     Mental Status: He is alert and oriented to person, place, and time.  Psychiatric:        Mood and Affect: Mood normal.        Behavior: Behavior normal.      LABORATORY DATA:  I have reviewed the labs as listed.     Latest Ref Rng & Units 12/07/2021   12:51 PM 08/25/2021    1:18 AM 08/24/2021    8:22 AM  CBC  WBC 4.0 - 10.5 K/uL 6.8  8.2  7.9   Hemoglobin 13.0 - 17.0 g/dL 11.7  8.1  8.2   Hematocrit 39.0 - 52.0 % 36.5  25.4  25.6   Platelets 150 - 400 K/uL 236  213  222       Latest Ref Rng & Units 04/10/2022    2:14 PM 12/07/2021    2:42 PM 12/07/2021   12:51 PM  CMP  Glucose 70 - 99 mg/dL   93   BUN 8 - 23 mg/dL   13   Creatinine 0.61 - 1.24 mg/dL 0.90  0.80  0.74   Sodium 135 - 145 mmol/L   138   Potassium 3.5 - 5.1 mmol/L   4.0   Chloride 98 - 111 mmol/L   108   CO2 22 - 32 mmol/L   23   Calcium 8.9 - 10.3 mg/dL   8.8   Total Protein 6.5 - 8.1 g/dL   6.4   Total Bilirubin 0.3 - 1.2 mg/dL   0.5   Alkaline Phos 38 - 126 U/L   126   AST  15 - 41 U/L   15   ALT 0 - 44 U/L   15     DIAGNOSTIC IMAGING:  I have independently reviewed the scans and discussed with the patient. CT Chest W Contrast  Result Date: 04/12/2022 CLINICAL DATA:  Follow-up right lung non-small-cell carcinoma. Previous right upper lobectomy. Personal history of colon carcinoma. * Tracking Code: BO * EXAM: CT CHEST, ABDOMEN, AND PELVIS WITH CONTRAST TECHNIQUE: Multidetector CT imaging of the chest, abdomen and pelvis was performed following the standard protocol during bolus administration of intravenous contrast. RADIATION DOSE REDUCTION: This exam was performed according to the departmental dose-optimization program which includes automated exposure control, adjustment of the mA and/or kV according to patient size and/or use of iterative reconstruction technique. CONTRAST:  147mL OMNIPAQUE IOHEXOL 300 MG/ML  SOLN COMPARISON:  Chest CT on 12/07/2021, and AP CT on 04/18/2021 FINDINGS: CT CHEST FINDINGS Cardiovascular: No acute findings. Aortic and coronary atherosclerotic calcification incidentally noted. Mediastinum/Lymph Nodes: 2 small right thyroid lobe nodules are seen, largest measuring 1 cm (no followup imaging is recommended). No pathologically enlarged lymph nodes identified. Lungs/Pleura: Stable posttreatment changes seen in the right hemithorax. A spiculated nodule is seen in the anterior right middle lobe which measures 10 x 9 mm. This is mildly increased in size from 7 x 7 mm on 12/07/2021 exam. A 7 mm nodule is also seen in the anterior right lower lobe on image 85/506, increased from 4 mm on prior exam. No evidence of pulmonary infiltrate or pleural effusion. Musculoskeletal:  No suspicious bone lesions identified. CT ABDOMEN AND PELVIS FINDINGS Hepatobiliary: No masses identified. Prior cholecystectomy. No evidence of biliary obstruction. Pancreas:  No mass or inflammatory changes. Spleen:  Within normal limits in size and appearance. Adrenals/Urinary tract: No  suspicious masses or hydronephrosis. Stomach/Bowel: Anastomosis again seen at the rectosigmoid junction. No mass identified. No evidence of obstruction, inflammatory process, or abnormal fluid collections. Normal appendix visualized. Vascular/Lymphatic: No pathologically enlarged lymph nodes identified. No acute vascular findings. Aortic atherosclerotic calcification incidentally noted. Reproductive:  No mass or other significant abnormality identified. Other:  None. Musculoskeletal: No suspicious bone lesions identified. Old L1 vertebral body wedge compression deformity again noted. IMPRESSION: Mild increase in size of 10 mm spiculated nodule in right middle lobe, and 7 mm nodule in right lower lobe, both highly suspicious for malignancy. No evidence of abdominal or pelvic metastatic disease. Aortic Atherosclerosis (ICD10-I70.0). Electronically Signed   By: Marlaine Hind M.D.   On: 04/12/2022 12:05   CT Abdomen Pelvis W Contrast  Result Date: 04/12/2022 CLINICAL DATA:  Follow-up right lung non-small-cell carcinoma. Previous right upper lobectomy. Personal history of colon carcinoma. * Tracking Code: BO * EXAM: CT CHEST, ABDOMEN, AND PELVIS WITH CONTRAST TECHNIQUE: Multidetector CT imaging of the chest, abdomen and pelvis was performed following the standard protocol during bolus administration of intravenous contrast. RADIATION DOSE REDUCTION: This exam was performed according to the departmental dose-optimization program which includes automated exposure control, adjustment of the mA and/or kV according to patient size and/or use of iterative reconstruction technique. CONTRAST:  166mL OMNIPAQUE IOHEXOL 300 MG/ML  SOLN COMPARISON:  Chest CT on 12/07/2021, and AP CT on 04/18/2021 FINDINGS: CT CHEST FINDINGS Cardiovascular: No acute findings. Aortic and coronary atherosclerotic calcification incidentally noted. Mediastinum/Lymph Nodes: 2 small right thyroid lobe nodules are seen, largest measuring 1 cm (no  followup imaging is recommended). No pathologically enlarged lymph nodes identified. Lungs/Pleura: Stable posttreatment changes seen in the right hemithorax. A spiculated nodule is seen in the anterior right middle lobe which measures 10 x 9 mm. This is mildly increased in size from 7 x 7 mm on 12/07/2021 exam. A 7 mm nodule is also seen in the anterior right lower lobe on image 85/506, increased from 4 mm on prior exam. No evidence of pulmonary infiltrate or pleural effusion. Musculoskeletal:  No suspicious bone lesions identified. CT ABDOMEN AND PELVIS FINDINGS Hepatobiliary: No masses identified. Prior cholecystectomy. No evidence of biliary obstruction. Pancreas:  No mass or inflammatory changes. Spleen:  Within normal limits in size and appearance. Adrenals/Urinary tract: No suspicious masses or hydronephrosis. Stomach/Bowel: Anastomosis again seen at the rectosigmoid junction. No mass identified. No evidence of obstruction, inflammatory process, or abnormal fluid collections. Normal appendix visualized. Vascular/Lymphatic: No pathologically enlarged lymph nodes identified. No acute vascular findings. Aortic atherosclerotic calcification incidentally noted. Reproductive:  No mass or other significant abnormality identified. Other:  None. Musculoskeletal: No suspicious bone lesions identified. Old L1 vertebral body wedge compression deformity again noted. IMPRESSION: Mild increase in size of 10 mm spiculated nodule in right middle lobe, and 7 mm nodule in right lower lobe, both highly suspicious for malignancy. No evidence of abdominal or pelvic metastatic disease. Aortic Atherosclerosis (ICD10-I70.0). Electronically Signed   By: Marlaine Hind M.D.   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  Hypermetabolic nodule in the right lobe of the thyroid indeterminate.  Hypermetabolic nodule  in  the right lobe of the prostate gland. - Right upper lobe wedge resection on 07/27/2021 - Pathology: 1.2 cm adenocarcinoma, acinar predominant, carcinoma focally involving visceral pleura, margins negative.  No LVI.  0/17 lymph nodes involved.  Nodal sites examined stations hilar, 4, 7, 9, 10, 11, 12, 13.  PT2APN0.    Social/family history: - He lives by himself.  He is seen with his daughter Leana Roe today. - He worked Armed forces logistics/support/administrative officer.  He had work-related asbestos exposure.  He quit smoking 3 years ago and smoked 2 packs/day for 65 years. - No family history of malignancies.   PLAN:  Stage Ib (T2N0) adenocarcinoma right lung: -I have reviewed CT CAP from 04/10/2022: Mild increase in size of 10 mm spiculated nodule in right middle lobe and 7 mm nodule in the right lower lobe, highly suspicious for malignancy.  No evidence of abdominal or pelvic metastatic disease. - We discussed the option of doing PET scan at this time.  He is reluctant to consider it.  We will schedule him for PET scan in 4 months.  He does not want to have any more surgery. - If the nodules are PET positive, will refer him to SBRT. - He was also evaluated by urology for elevated PSA.  He declined further work-up in the form of biopsy.     Orders placed this encounter:  Orders Placed This Encounter  Procedures   NM PET Image Initial (PI) Skull Base To Thigh      Derek Jack, MD [place] [phone]

## 2022-05-14 DIAGNOSIS — H401112 Primary open-angle glaucoma, right eye, moderate stage: Secondary | ICD-10-CM | POA: Diagnosis not present

## 2022-05-14 DIAGNOSIS — H401123 Primary open-angle glaucoma, left eye, severe stage: Secondary | ICD-10-CM | POA: Diagnosis not present

## 2022-05-14 DIAGNOSIS — H01002 Unspecified blepharitis right lower eyelid: Secondary | ICD-10-CM | POA: Diagnosis not present

## 2022-05-14 DIAGNOSIS — H01001 Unspecified blepharitis right upper eyelid: Secondary | ICD-10-CM | POA: Diagnosis not present

## 2022-05-24 DIAGNOSIS — R109 Unspecified abdominal pain: Secondary | ICD-10-CM | POA: Diagnosis not present

## 2022-05-24 DIAGNOSIS — R030 Elevated blood-pressure reading, without diagnosis of hypertension: Secondary | ICD-10-CM | POA: Diagnosis not present

## 2022-05-24 DIAGNOSIS — Z6835 Body mass index (BMI) 35.0-35.9, adult: Secondary | ICD-10-CM | POA: Diagnosis not present

## 2022-05-24 DIAGNOSIS — M609 Myositis, unspecified: Secondary | ICD-10-CM | POA: Diagnosis not present

## 2022-06-14 ENCOUNTER — Encounter (HOSPITAL_COMMUNITY)
Admission: RE | Admit: 2022-06-14 | Discharge: 2022-06-14 | Disposition: A | Discharge status: Home / Self Care | Payer: Medicare Other | Source: Ambulatory Visit | Attending: Hematology | Admitting: Hematology

## 2022-06-14 DIAGNOSIS — C3491 Malignant neoplasm of unspecified part of right bronchus or lung: Secondary | ICD-10-CM | POA: Insufficient documentation

## 2022-06-19 ENCOUNTER — Ambulatory Visit: Payer: Medicare Other | Admitting: Hematology

## 2022-06-21 ENCOUNTER — Encounter (HOSPITAL_COMMUNITY): Admission: RE | Admit: 2022-06-21 | Payer: Medicare Other | Source: Ambulatory Visit

## 2022-06-21 ENCOUNTER — Encounter (HOSPITAL_COMMUNITY): Payer: Self-pay

## 2022-06-25 DIAGNOSIS — Z0001 Encounter for general adult medical examination with abnormal findings: Secondary | ICD-10-CM | POA: Diagnosis not present

## 2022-06-25 DIAGNOSIS — E7849 Other hyperlipidemia: Secondary | ICD-10-CM | POA: Diagnosis not present

## 2022-06-25 DIAGNOSIS — Z6835 Body mass index (BMI) 35.0-35.9, adult: Secondary | ICD-10-CM | POA: Diagnosis not present

## 2022-06-25 DIAGNOSIS — I5032 Chronic diastolic (congestive) heart failure: Secondary | ICD-10-CM | POA: Diagnosis not present

## 2022-06-25 DIAGNOSIS — I10 Essential (primary) hypertension: Secondary | ICD-10-CM | POA: Diagnosis not present

## 2022-06-25 DIAGNOSIS — D649 Anemia, unspecified: Secondary | ICD-10-CM | POA: Diagnosis not present

## 2022-06-25 DIAGNOSIS — J449 Chronic obstructive pulmonary disease, unspecified: Secondary | ICD-10-CM | POA: Diagnosis not present

## 2022-06-26 ENCOUNTER — Ambulatory Visit: Payer: Medicare Other | Admitting: Hematology

## 2022-07-02 ENCOUNTER — Other Ambulatory Visit (HOSPITAL_COMMUNITY): Payer: Self-pay | Admitting: Family Medicine

## 2022-07-02 DIAGNOSIS — R102 Pelvic and perineal pain: Secondary | ICD-10-CM

## 2022-07-02 DIAGNOSIS — R110 Nausea: Secondary | ICD-10-CM

## 2022-07-02 DIAGNOSIS — R109 Unspecified abdominal pain: Secondary | ICD-10-CM

## 2022-07-02 DIAGNOSIS — R059 Cough, unspecified: Secondary | ICD-10-CM

## 2022-08-07 ENCOUNTER — Ambulatory Visit (HOSPITAL_COMMUNITY)
Admission: RE | Admit: 2022-08-07 | Discharge: 2022-08-07 | Disposition: A | Discharge status: Home / Self Care | Payer: Medicare Other | Source: Ambulatory Visit | Attending: Family Medicine | Admitting: Family Medicine

## 2022-08-07 DIAGNOSIS — R110 Nausea: Secondary | ICD-10-CM | POA: Diagnosis not present

## 2022-08-07 DIAGNOSIS — J432 Centrilobular emphysema: Secondary | ICD-10-CM | POA: Diagnosis not present

## 2022-08-07 DIAGNOSIS — R059 Cough, unspecified: Secondary | ICD-10-CM | POA: Insufficient documentation

## 2022-08-07 DIAGNOSIS — R918 Other nonspecific abnormal finding of lung field: Secondary | ICD-10-CM | POA: Diagnosis not present

## 2022-08-07 DIAGNOSIS — N281 Cyst of kidney, acquired: Secondary | ICD-10-CM | POA: Diagnosis not present

## 2022-08-07 DIAGNOSIS — N3289 Other specified disorders of bladder: Secondary | ICD-10-CM | POA: Diagnosis not present

## 2022-08-07 DIAGNOSIS — R109 Unspecified abdominal pain: Secondary | ICD-10-CM

## 2022-08-07 DIAGNOSIS — R102 Pelvic and perineal pain: Secondary | ICD-10-CM | POA: Diagnosis not present

## 2022-08-07 MED ORDER — IOHEXOL 300 MG/ML  SOLN
100.0000 mL | Freq: Once | INTRAMUSCULAR | Status: AC | PRN
  Administered 2022-08-07: 100 mL via INTRAVENOUS

## 2022-08-16 ENCOUNTER — Other Ambulatory Visit (HOSPITAL_COMMUNITY): Payer: Medicare Other

## 2022-08-21 ENCOUNTER — Ambulatory Visit: Payer: Medicare Other | Admitting: Hematology

## 2022-09-11 DIAGNOSIS — M79602 Pain in left arm: Secondary | ICD-10-CM | POA: Diagnosis not present

## 2022-09-11 DIAGNOSIS — R030 Elevated blood-pressure reading, without diagnosis of hypertension: Secondary | ICD-10-CM | POA: Diagnosis not present

## 2022-09-11 DIAGNOSIS — M542 Cervicalgia: Secondary | ICD-10-CM | POA: Diagnosis not present

## 2022-09-11 DIAGNOSIS — Z6835 Body mass index (BMI) 35.0-35.9, adult: Secondary | ICD-10-CM | POA: Diagnosis not present

## 2022-12-18 DIAGNOSIS — L120 Bullous pemphigoid: Secondary | ICD-10-CM | POA: Diagnosis not present

## 2022-12-18 DIAGNOSIS — Z5181 Encounter for therapeutic drug level monitoring: Secondary | ICD-10-CM | POA: Diagnosis not present

## 2022-12-18 NOTE — Progress Notes (Signed)
I have performed a dermatologically relevant history and physical examination. I have planned and supervised proceduresand the evaluation and treatment plan and I agree with the above.

## 2022-12-28 DIAGNOSIS — G40909 Epilepsy, unspecified, not intractable, without status epilepticus: Secondary | ICD-10-CM | POA: Diagnosis not present

## 2022-12-28 DIAGNOSIS — I25118 Atherosclerotic heart disease of native coronary artery with other forms of angina pectoris: Secondary | ICD-10-CM | POA: Diagnosis not present

## 2022-12-28 DIAGNOSIS — I255 Ischemic cardiomyopathy: Secondary | ICD-10-CM | POA: Diagnosis not present

## 2022-12-28 DIAGNOSIS — Z720 Tobacco use: Secondary | ICD-10-CM | POA: Diagnosis not present

## 2023-01-07 DIAGNOSIS — H401112 Primary open-angle glaucoma, right eye, moderate stage: Secondary | ICD-10-CM | POA: Diagnosis not present

## 2023-01-30 DIAGNOSIS — C441122 Basal cell carcinoma of skin of right lower eyelid, including canthus: Secondary | ICD-10-CM | POA: Diagnosis not present

## 2023-01-30 DIAGNOSIS — C44311 Basal cell carcinoma of skin of nose: Secondary | ICD-10-CM | POA: Diagnosis not present

## 2023-01-30 DIAGNOSIS — C44319 Basal cell carcinoma of skin of other parts of face: Secondary | ICD-10-CM | POA: Diagnosis not present

## 2023-03-13 DIAGNOSIS — C44319 Basal cell carcinoma of skin of other parts of face: Secondary | ICD-10-CM | POA: Diagnosis not present

## 2023-03-13 DIAGNOSIS — Z08 Encounter for follow-up examination after completed treatment for malignant neoplasm: Secondary | ICD-10-CM | POA: Diagnosis not present

## 2023-03-13 DIAGNOSIS — Z85828 Personal history of other malignant neoplasm of skin: Secondary | ICD-10-CM | POA: Diagnosis not present

## 2023-04-24 DIAGNOSIS — I788 Other diseases of capillaries: Secondary | ICD-10-CM | POA: Diagnosis not present

## 2023-04-24 DIAGNOSIS — Z85828 Personal history of other malignant neoplasm of skin: Secondary | ICD-10-CM | POA: Diagnosis not present

## 2023-04-24 DIAGNOSIS — L820 Inflamed seborrheic keratosis: Secondary | ICD-10-CM | POA: Diagnosis not present

## 2023-04-24 DIAGNOSIS — Z08 Encounter for follow-up examination after completed treatment for malignant neoplasm: Secondary | ICD-10-CM | POA: Diagnosis not present

## 2023-04-25 DIAGNOSIS — I5032 Chronic diastolic (congestive) heart failure: Secondary | ICD-10-CM | POA: Diagnosis not present

## 2023-04-25 DIAGNOSIS — I10 Essential (primary) hypertension: Secondary | ICD-10-CM | POA: Diagnosis not present

## 2023-04-25 DIAGNOSIS — Z23 Encounter for immunization: Secondary | ICD-10-CM | POA: Diagnosis not present

## 2023-04-25 DIAGNOSIS — L120 Bullous pemphigoid: Secondary | ICD-10-CM | POA: Diagnosis not present

## 2023-04-25 DIAGNOSIS — G40909 Epilepsy, unspecified, not intractable, without status epilepticus: Secondary | ICD-10-CM | POA: Diagnosis not present

## 2023-04-25 DIAGNOSIS — F17210 Nicotine dependence, cigarettes, uncomplicated: Secondary | ICD-10-CM | POA: Diagnosis not present

## 2023-04-25 DIAGNOSIS — E7849 Other hyperlipidemia: Secondary | ICD-10-CM | POA: Diagnosis not present

## 2023-04-25 DIAGNOSIS — C3490 Malignant neoplasm of unspecified part of unspecified bronchus or lung: Secondary | ICD-10-CM | POA: Diagnosis not present

## 2023-04-25 DIAGNOSIS — I25119 Atherosclerotic heart disease of native coronary artery with unspecified angina pectoris: Secondary | ICD-10-CM | POA: Diagnosis not present

## 2023-04-25 DIAGNOSIS — E559 Vitamin D deficiency, unspecified: Secondary | ICD-10-CM | POA: Diagnosis not present

## 2023-04-25 DIAGNOSIS — Z1329 Encounter for screening for other suspected endocrine disorder: Secondary | ICD-10-CM | POA: Diagnosis not present

## 2023-04-25 DIAGNOSIS — J449 Chronic obstructive pulmonary disease, unspecified: Secondary | ICD-10-CM | POA: Diagnosis not present

## 2023-06-18 IMAGING — CT CT ABD-PELV W/ CM
2 of 5 series · 17 of 46 positions shown, 19 images · IV contrast (omnipaque)
Comparison: 06/01/2020

CLINICAL DATA: Epigastric pain

EXAM:
CT ABDOMEN AND PELVIS WITH CONTRAST
TECHNIQUE: Multidetector CT imaging of the abdomen and pelvis was performed
using the standard protocol following bolus administration of
intravenous contrast.
CONTRAST:  100mL OMNIPAQUE IOHEXOL 300 MG/ML  SOLN

[Series 2: axial st · axial · 0.97mm/px · z∈[+1025,+1490]mm · 14 of 105 slices shown, 16 images]
[im 6/105  soft-tissue]
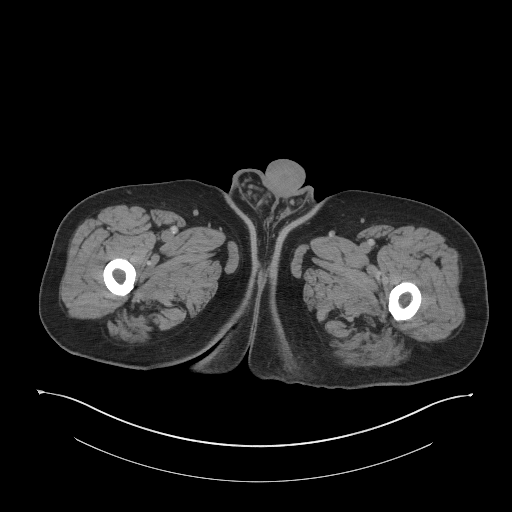
[im 6/105  bone]
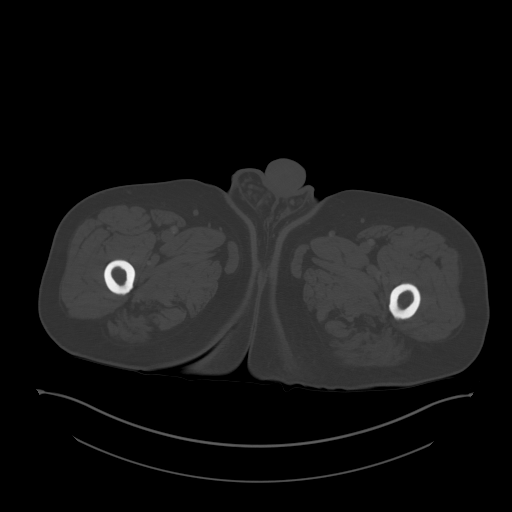
[im 16/105  soft-tissue]
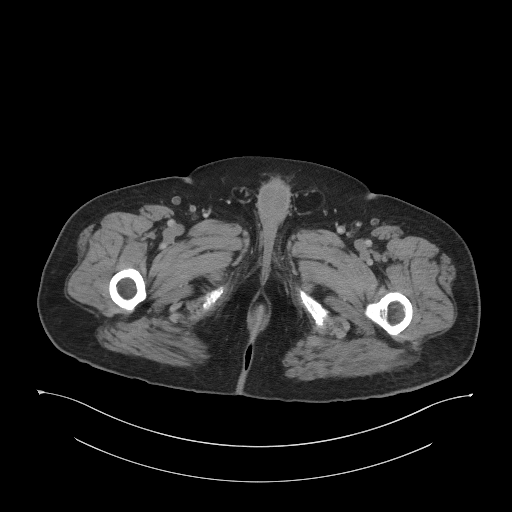
[im 21/105  soft-tissue]
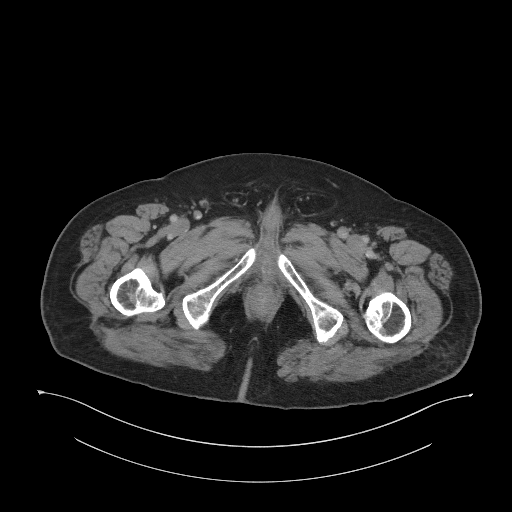
[im 27/105  soft-tissue]
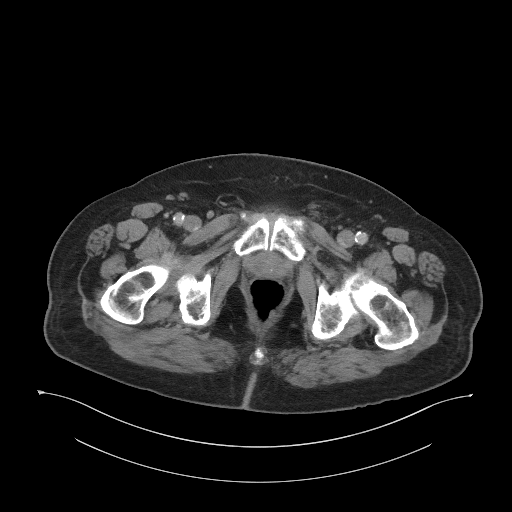
[im 37/105  soft-tissue]
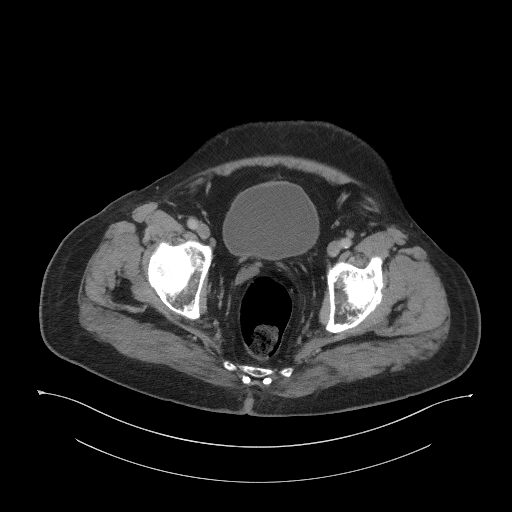
[im 42/105  soft-tissue]
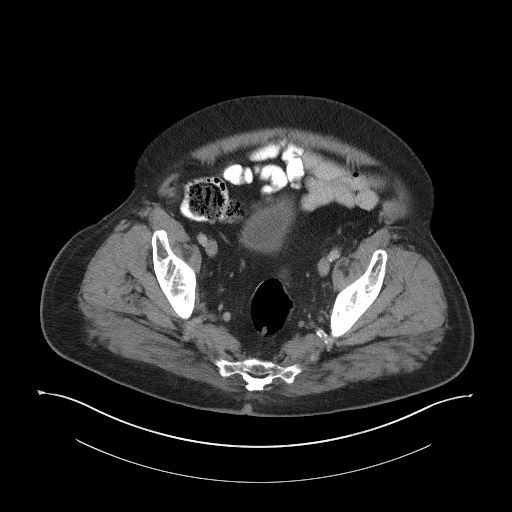
[im 47/105  soft-tissue]
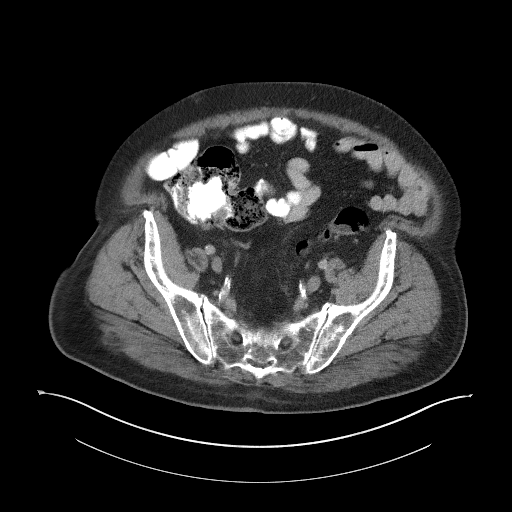
[im 58/105  soft-tissue]
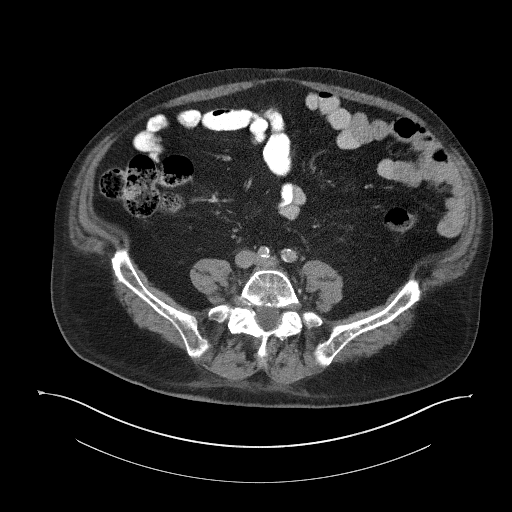
[im 63/105  soft-tissue]
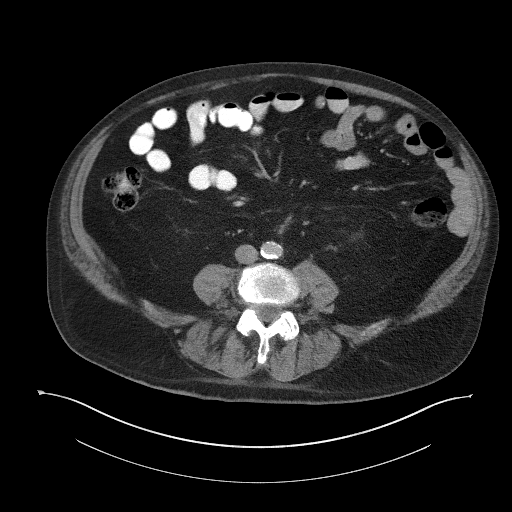
[im 63/105  bone]
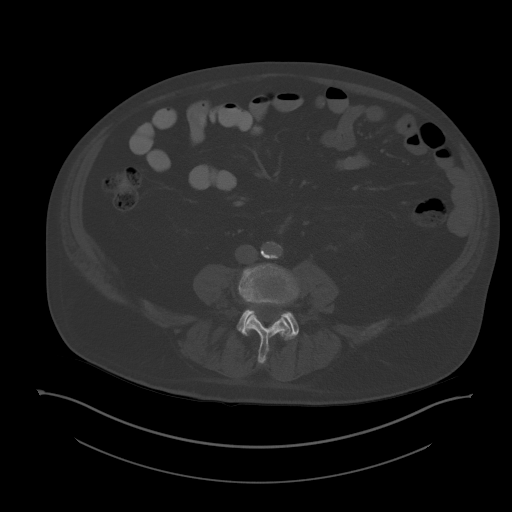
[im 68/105  soft-tissue]
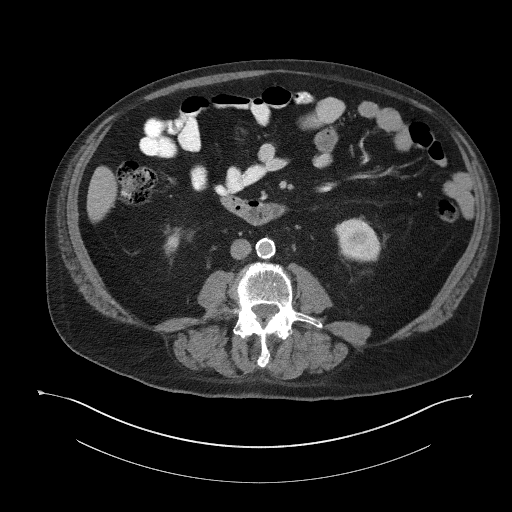
[im 79/105  soft-tissue]
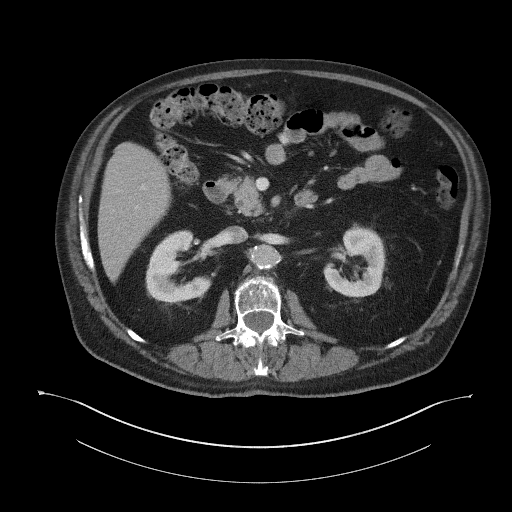
[im 84/105  soft-tissue]
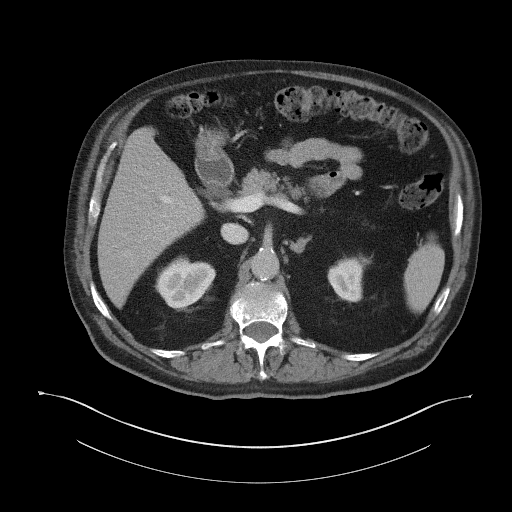
[im 89/105  soft-tissue]
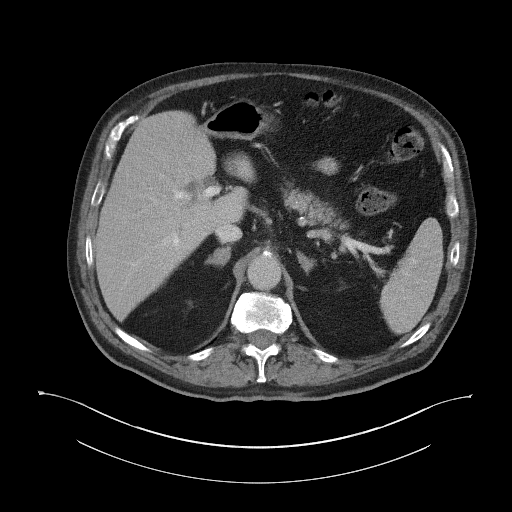
[im 99/105  soft-tissue]
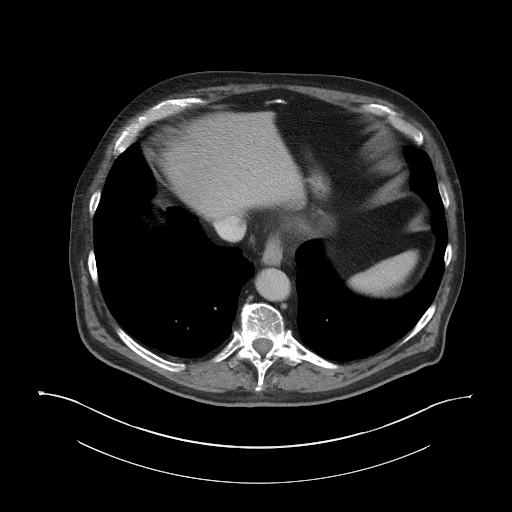

[Series 5: coronal st · coronal · 0.91mm/px · 3 of 127 slices shown]
[im 43/127  soft-tissue]
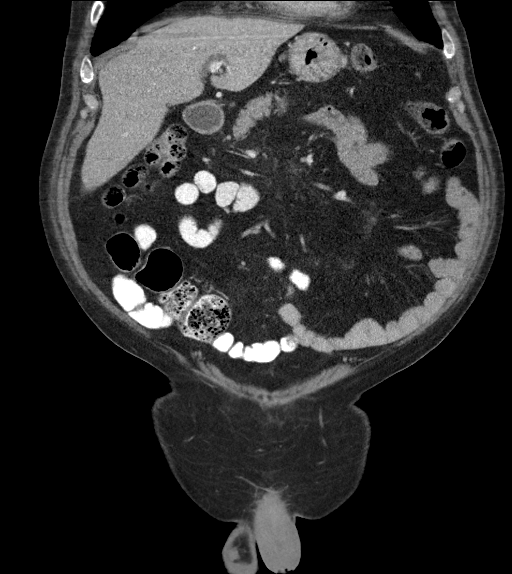
[im 57/127  soft-tissue]
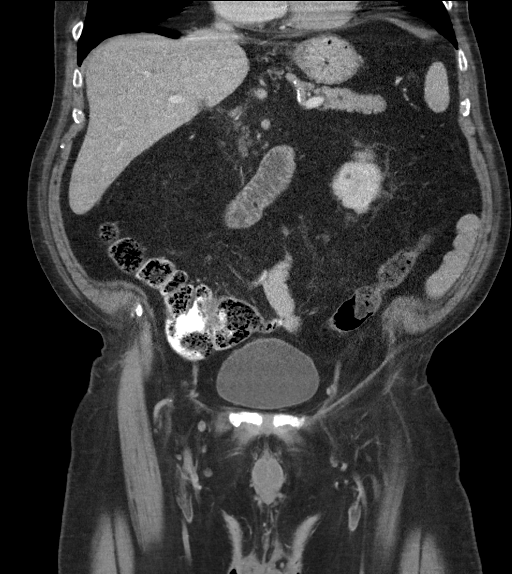
[im 71/127  soft-tissue]
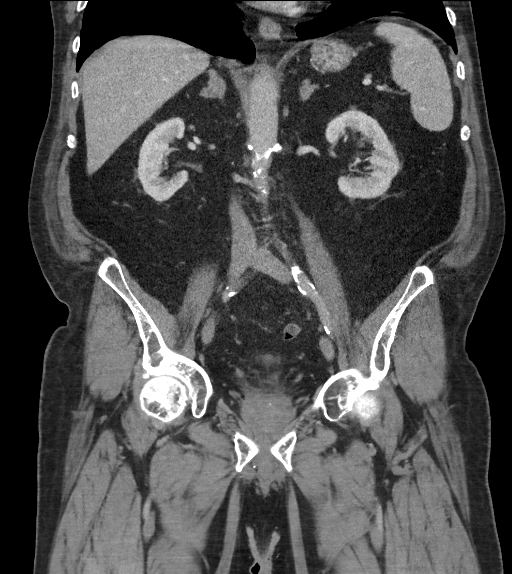

[17 of 46 positions shown; findings below may reference images not displayed]

FINDINGS: Lower chest: Lung bases are clear. No effusions. Heart is normal
size.

Hepatobiliary: Prior cholecystectomy. Slight intrahepatic biliary
ductal dilatation. Common bile duct mildly dilated to 10 mm.
Findings are stable since prior study compatible with post
cholecystectomy state. No focal hepatic abnormality.

Pancreas: No focal abnormality or ductal dilatation.

Spleen: No focal abnormality.  Normal size.

Adrenals/Urinary Tract: No adrenal abnormality. No focal renal
abnormality. No stones or hydronephrosis. Urinary bladder is
unremarkable.

Stomach/Bowel: Normal appendix. Stomach, large and small bowel
grossly unremarkable.

Vascular/Lymphatic: Heavily calcified aorta and iliac vessels. No
evidence of aneurysm or adenopathy.

Reproductive: No visible focal abnormality.

Other: No free fluid or free air.

Musculoskeletal: No acute bony abnormality. Mild chronic compression
fracture at L1, stable.
IMPRESSION: No acute findings in the abdomen or pelvis.

## 2023-07-11 DIAGNOSIS — H401123 Primary open-angle glaucoma, left eye, severe stage: Secondary | ICD-10-CM | POA: Diagnosis not present

## 2023-07-25 IMAGING — CT NM PET TUM IMG INITIAL (PI) SKULL BASE T - THIGH
1 of 7 series · 1 of 25 positions shown · non-contrast
Comparison: None.

CLINICAL DATA: Initial treatment strategy for pulmonary nodule.

EXAM:
NUCLEAR MEDICINE PET SKULL BASE TO THIGH
TECHNIQUE: 8 mCi F-18 FDG was injected intravenously. Full-ring PET imaging was
performed from the skull base to thigh after the radiotracer. CT
data was obtained and used for attenuation correction and anatomic
localization.
Fasting blood glucose: 102 mg/dl

[Series 3: ctac · axial · 3.0mm · 0.98mm/px · 1 of 323 slices shown]
[im 323/323  brain]
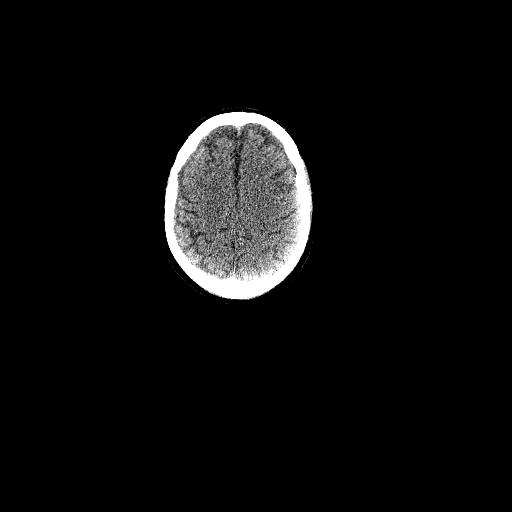

[1 of 25 positions shown; findings below may reference images not displayed]

FINDINGS: Mediastinal blood pool activity: SUV max

Liver activity: SUV max NA

NECK: No hypermetabolic lymph nodes in the neck. Hypermetabolic
nodule in the RIGHT lobe of thyroid gland with SUV max equal 5.8.
Nodule measures 16 mm (image 76/3).

Incidental CT findings: none

CHEST: Within the RIGHT upper lobe, spiculated nodule measuring 14
mm (image 81) and has intense metabolic activity with SUV max equal
6.6.

No additional enlarged or hypermetabolic lymph nodes in the lungs.

No hypermetabolic mediastinal lymph nodes or supraclavicular lymph
nodes.

Incidental CT findings: none

ABDOMEN/PELVIS: No abnormal hypermetabolic activity within the
liver, pancreas, adrenal glands, or spleen. No hypermetabolic lymph
nodes in the abdomen or pelvis.

Incidental CT findings: Enlargement of the RIGHT adrenal gland has
low attenuation consistent with benign adenoma.

No abnormal activity in liver.  No abdominopelvic adenopathy.

There is a focus intense metabolic activity within the RIGHT lobe of
the prostate gland with SUV max equal 7.1 (image 258).

SKELETON: No focal hypermetabolic activity to suggest skeletal
metastasis.

Incidental CT findings: none
IMPRESSION: 1. Hypermetabolic pulmonary nodule in the RIGHT upper lobe most
consistent with primary bronchogenic carcinoma. (Stage IA).
2. Hypermetabolic nodule in the RIGHT lobe of thyroid gland is
indeterminate. Recommend ultrasound-guided biopsy.
3. Hypermetabolic nodule in the RIGHT lobe of the prostate gland.
Recommend correlation with PSA and if elevated recommend prostate
MRI.

These results will be called to the ordering clinician or
representative by the Radiologist Assistant, and communication
documented in the PACS or [REDACTED].

## 2023-08-07 DIAGNOSIS — Z85828 Personal history of other malignant neoplasm of skin: Secondary | ICD-10-CM | POA: Diagnosis not present

## 2023-08-07 DIAGNOSIS — Z08 Encounter for follow-up examination after completed treatment for malignant neoplasm: Secondary | ICD-10-CM | POA: Diagnosis not present

## 2023-08-07 DIAGNOSIS — L820 Inflamed seborrheic keratosis: Secondary | ICD-10-CM | POA: Diagnosis not present

## 2023-08-27 DIAGNOSIS — H353133 Nonexudative age-related macular degeneration, bilateral, advanced atrophic without subfoveal involvement: Secondary | ICD-10-CM | POA: Diagnosis not present

## 2023-08-27 DIAGNOSIS — Z961 Presence of intraocular lens: Secondary | ICD-10-CM | POA: Diagnosis not present

## 2023-08-27 DIAGNOSIS — H401133 Primary open-angle glaucoma, bilateral, severe stage: Secondary | ICD-10-CM | POA: Diagnosis not present

## 2023-08-29 DIAGNOSIS — Z5181 Encounter for therapeutic drug level monitoring: Secondary | ICD-10-CM | POA: Diagnosis not present

## 2023-08-29 DIAGNOSIS — L120 Bullous pemphigoid: Secondary | ICD-10-CM | POA: Diagnosis not present

## 2023-08-29 NOTE — Progress Notes (Signed)
 SUBJECTIVE: [name] presents with BP 2 years clear in August   ROS is negative for other lumps, bumps, or dermatologic conditions. ROS is negative for fevers or chills. Past medical history, allergies, surgical history, family history, and medications have been reviewed and documented in the encounter today.   OBJECTIVE: Patient is a well nourished 83 y.o. male in no apparent distress with appropriate mood and affect. Exam today focused on the skin and included exam of the following:  - Face  - eyelids and conjunctivae  - Scalp  - Neck  - Chest  - Bilateral upper extremities Exam findings included: totally clear There are no lesions consistent with non melanoma skin cancer or suggestive of possible malignant melanoma I have examined his nevi with the dermatoscope and all show no changes suspicious for melanoma.   ASSESSMENT: Encounter Diagnoses  Name Primary?  . BP (bullous pemphigoid) Yes  . Medication monitoring encounter     PLAN:  - The above diagnoses and treatment options were discussed with the patient today. - The following medications were prescribed today: Orders Placed This Encounter  Medications  . predniSONE (DELTASONE) 2.5 mg tablet    Sig: Take 1 tab per day    Dispense:  90 tablet    Refill:  3    - The patient was informed of the major side effects of medications and encouraged to read the package insert for further information. - The patient was given their after visit summary and was encouraged to call with any questions.  - Patient is to return to clinic in 6 months  Joseph Jorizzo

## 2023-10-11 DIAGNOSIS — E782 Mixed hyperlipidemia: Secondary | ICD-10-CM | POA: Diagnosis not present

## 2023-10-11 DIAGNOSIS — Z1389 Encounter for screening for other disorder: Secondary | ICD-10-CM | POA: Diagnosis not present

## 2023-10-11 DIAGNOSIS — Z6836 Body mass index (BMI) 36.0-36.9, adult: Secondary | ICD-10-CM | POA: Diagnosis not present

## 2023-10-11 DIAGNOSIS — Z0001 Encounter for general adult medical examination with abnormal findings: Secondary | ICD-10-CM | POA: Diagnosis not present

## 2023-10-11 DIAGNOSIS — I10 Essential (primary) hypertension: Secondary | ICD-10-CM | POA: Diagnosis not present

## 2023-10-11 DIAGNOSIS — Z1331 Encounter for screening for depression: Secondary | ICD-10-CM | POA: Diagnosis not present

## 2023-10-11 DIAGNOSIS — J449 Chronic obstructive pulmonary disease, unspecified: Secondary | ICD-10-CM | POA: Diagnosis not present

## 2023-10-11 DIAGNOSIS — I5032 Chronic diastolic (congestive) heart failure: Secondary | ICD-10-CM | POA: Diagnosis not present

## 2024-01-15 DIAGNOSIS — J449 Chronic obstructive pulmonary disease, unspecified: Secondary | ICD-10-CM | POA: Diagnosis not present

## 2024-01-15 DIAGNOSIS — E782 Mixed hyperlipidemia: Secondary | ICD-10-CM | POA: Diagnosis not present

## 2024-01-15 DIAGNOSIS — E7849 Other hyperlipidemia: Secondary | ICD-10-CM | POA: Diagnosis not present

## 2024-01-15 DIAGNOSIS — Z6836 Body mass index (BMI) 36.0-36.9, adult: Secondary | ICD-10-CM | POA: Diagnosis not present

## 2024-01-15 DIAGNOSIS — Z131 Encounter for screening for diabetes mellitus: Secondary | ICD-10-CM | POA: Diagnosis not present

## 2024-01-15 DIAGNOSIS — I5032 Chronic diastolic (congestive) heart failure: Secondary | ICD-10-CM | POA: Diagnosis not present

## 2024-01-15 DIAGNOSIS — I10 Essential (primary) hypertension: Secondary | ICD-10-CM | POA: Diagnosis not present

## 2024-01-15 DIAGNOSIS — Z1329 Encounter for screening for other suspected endocrine disorder: Secondary | ICD-10-CM | POA: Diagnosis not present

## 2024-01-16 DIAGNOSIS — R188 Other ascites: Secondary | ICD-10-CM | POA: Diagnosis not present

## 2024-01-20 ENCOUNTER — Encounter (INDEPENDENT_AMBULATORY_CARE_PROVIDER_SITE_OTHER): Payer: Self-pay | Admitting: *Deleted

## 2024-02-03 ENCOUNTER — Other Ambulatory Visit (INDEPENDENT_AMBULATORY_CARE_PROVIDER_SITE_OTHER): Payer: Self-pay | Admitting: Gastroenterology

## 2024-02-03 ENCOUNTER — Ambulatory Visit (INDEPENDENT_AMBULATORY_CARE_PROVIDER_SITE_OTHER): Admitting: Gastroenterology

## 2024-02-03 ENCOUNTER — Encounter (INDEPENDENT_AMBULATORY_CARE_PROVIDER_SITE_OTHER): Payer: Self-pay | Admitting: Gastroenterology

## 2024-02-03 VITALS — BP 116/64 | HR 72 | Temp 97.9°F | Ht 72.0 in | Wt 246.5 lb

## 2024-02-03 DIAGNOSIS — R1013 Epigastric pain: Secondary | ICD-10-CM | POA: Diagnosis not present

## 2024-02-03 DIAGNOSIS — Z860100 Personal history of colon polyps, unspecified: Secondary | ICD-10-CM

## 2024-02-03 DIAGNOSIS — B9681 Helicobacter pylori [H. pylori] as the cause of diseases classified elsewhere: Secondary | ICD-10-CM

## 2024-02-03 DIAGNOSIS — Z8719 Personal history of other diseases of the digestive system: Secondary | ICD-10-CM

## 2024-02-03 DIAGNOSIS — K760 Fatty (change of) liver, not elsewhere classified: Secondary | ICD-10-CM | POA: Diagnosis not present

## 2024-02-03 DIAGNOSIS — A048 Other specified bacterial intestinal infections: Secondary | ICD-10-CM

## 2024-02-03 DIAGNOSIS — K279 Peptic ulcer, site unspecified, unspecified as acute or chronic, without hemorrhage or perforation: Secondary | ICD-10-CM | POA: Insufficient documentation

## 2024-02-03 MED ORDER — METRONIDAZOLE 500 MG PO TABS: 500.0000 mg | ORAL_TABLET | Freq: Three times a day (TID) | ORAL | 0 refills | Status: AC

## 2024-02-03 MED ORDER — PANTOPRAZOLE SODIUM 40 MG PO TBEC: 40.0000 mg | DELAYED_RELEASE_TABLET | Freq: Two times a day (BID) | ORAL | 3 refills | Status: DC

## 2024-02-03 MED ORDER — DOXYCYCLINE MONOHYDRATE 100 MG PO TABS: 100.0000 mg | ORAL_TABLET | Freq: Two times a day (BID) | ORAL | 0 refills | Status: AC

## 2024-02-03 MED ORDER — BISMUTH 262 MG PO CHEW: 2.0000 | CHEWABLE_TABLET | Freq: Four times a day (QID) | ORAL | 0 refills | Status: AC

## 2024-02-03 MED ORDER — BISMUTH/METRONIDAZ/TETRACYCLIN 140-125-125 MG PO CAPS: 3.0000 | ORAL_CAPSULE | Freq: Four times a day (QID) | ORAL | 0 refills | Status: DC

## 2024-02-03 MED ORDER — PANTOPRAZOLE SODIUM 40 MG PO TBEC: 40.0000 mg | DELAYED_RELEASE_TABLET | Freq: Two times a day (BID) | ORAL | 0 refills | Status: DC

## 2024-02-03 NOTE — Patient Instructions (Signed)
 It was very nice to meet you today, as dicussed with will plan for the following :  1) You were found to have an infection called H. pylori, which is a bacteria that lives in the stomach, this is from biopsies in 2023. Sending you a pack of medications to take for 10 days:  3 antibiotics and an acid blocking medication Pantoprazole . Take them all . It is VERY IMPORTANT that you take all of the medications as directed. If the infection is not fully treated, in can increase your risk of stomach cancer.

## 2024-02-03 NOTE — Progress Notes (Signed)
 [name] , M.D. Gastroenterology & Hepatology [facility] Gastroenterology [address] [place], [place] [address] Primary Care Physician: Toribio Jerel MATSU, MD [address] [place] [address]  Chief Complaint: Hepatic steatosis, abdominal bloating, surveillance colonoscopy with history of colon cancer, surveillance upper endoscopy with history of peptic ulcer disease, H. pylori gastritis  History of Present Illness: Alan Melendez is a 83 y.o. 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 pylori gastritis  Patient today reports that he has a lot of fluid and gas in his abdomen and all over the body.  Patient continues to report that he had colon surgery years back  and since than gas is trapped .  Patient reports abdominal bloating because of pelvic fluid and gas and is abdomen.  Patient denies being told or treated for H. pylori which was seen on biopsy 2023 .  Unfortunately most of the clinic encounter was spent on reassuring patient that the recent CT scan did not show any ascites and my exam did not demonstrate any fluid or shifting dullness to suggest any  trapped fluid  in his abdomen   Last labs from 11/2023 hemoglobin 13.6 platelet 234 AST 6 ALT 11 alk phos 121 continue 0.4  Last EGD: 08/2021  - Normal appearing, widely patent esophagus and GEJ. - Two clean based 7- 8 mm non- bleeding gastric ulcers in the gastric antrum- biopsies done for H. pylori by pathology- Forrest Classifcation Type III. - Mild patchy gastritis noted throughout the stomach. - One large 1 cm cratered ulcer in the  duodenal bulb with pigmented material- Forrest Classification Type IIb. - The pot- bulbar duodenum appeared normal.  A. STOMACH, BIOPSY:  - Gastric antral and oxyntic mucosa with Helicobacter pylori-associated  gastritis  - Immunohistochemical stain for H. pylori identified rare coccoid forms  of Helicobacter pylori   Last Colonoscopy:2022  - Three 2 to 6 mm polyps in the cecum, removed with a cold snare. Resected and retrieved. - One 25 mm polyp in the proximal transverse colon, removed piecemeal using a hot snare. Resected and retrieved. Clips were placed. Injected. - Three 3 to 8 mm polyps in the sigmoid colon, in the descending colon and in the transverse colon, removed with a cold snare. Resected and retrieved. - The distal rectum and anal verge are normal on retroflexion view.  A. COLON, CECUM, TRANSVERSE, POLYPECTOMY:  -  Multiple fragments of tubular adenoma(s)  -  No high-grade dysplasia or malignancy identified   B. COLON, TRANSVERSE, BIOPSY:  -  Multiple fragments of sessile serrated polyp(s)  -  No high grade dysplasia or malignancy identified   C. COLON, DESCENDING, SIGMOID, POLYPECTOMY:  -  Tubular adenoma (3 of 5 fragments)  -  Sessile serrated polyp (1 of 5 fragments)  -  Benign colonic mucosa (1 of 5 fragments)  -  No high-grade dysplasia or malignancy identified   Repeat 6 months    FHx: neg for any gastrointestinal/liver disease, no malignancies Social: quit smoking 2 years ago but used to smoke heavily, neg alcohol  or illicit drug use Surgical: partial sigmoid colectomy for adenocarcinoma on 09/23/2019. cholecystectomy and appendectomy  Past Medical History: Past  Medical History:  Diagnosis Date   Anxiety    Cataracts, bilateral    Colon cancer (HCC)    Depression    Epilepsy (HCC)    Glaucoma    Heart attack (HCC)    History of kidney stones     Past Surgical History: Past Surgical History:  Procedure Laterality Date   APPENDECTOMY     BIOPSY   08/23/2021   Procedure: BIOPSY;  Surgeon: Kristie Lamprey, MD;  Location: [facility] ENDOSCOPY;  Service: Gastroenterology;;   CHOLECYSTECTOMY     COLONOSCOPY WITH PROPOFOL  N/A 03/21/2021   Procedure: COLONOSCOPY WITH PROPOFOL ;  Surgeon: Eartha Angelia Sieving, MD;  Location: [facility] ENDO SUITE;  Service: Gastroenterology;  Laterality: N/A;  1:45, pt knows to arrive at 9:30   coloonscopy     CORONARY ANGIOPLASTY     ESOPHAGOGASTRODUODENOSCOPY (EGD) WITH PROPOFOL  N/A 08/23/2021   Procedure: ESOPHAGOGASTRODUODENOSCOPY (EGD) WITH PROPOFOL ;  Surgeon: Kristie Lamprey, MD;  Location: [facility] ENDOSCOPY;  Service: Gastroenterology;  Laterality: N/A;  MELENA AND IDA   HEMOSTASIS CLIP PLACEMENT  03/21/2021   Procedure: HEMOSTASIS CLIP PLACEMENT;  Surgeon: Eartha Angelia Sieving, MD;  Location: [facility] ENDO SUITE;  Service: Gastroenterology;;   INTERCOSTAL NERVE BLOCK Right 07/27/2021   Procedure: INTERCOSTAL NERVE BLOCK;  Surgeon: Kerrin Elspeth BROCKS, MD;  Location: [facility] OR;  Service: Thoracic;  Laterality: Right;   LYMPH NODE DISSECTION Right 07/27/2021   Procedure: LYMPH NODE DISSECTION;  Surgeon: Kerrin Elspeth BROCKS, MD;  Location: [facility] OR;  Service: Thoracic;  Laterality: Right;   POLYPECTOMY  03/21/2021   Procedure: POLYPECTOMY;  Surgeon: Eartha Angelia Sieving, MD;  Location: [facility] ENDO [facility];  Service: Gastroenterology;;   SUBMUCOSAL TATTOO INJECTION  03/21/2021   Procedure: SUBMUCOSAL TATTOO INJECTION;  Surgeon: Eartha Angelia, Sieving, MD;  Location: [facility] ENDO [facility]ITE;  Service: Gastroenterology;;    Family History: Family History  Problem Relation Age of Onset   Diabetes Father    Diabetes Sister     Social History: Social History   Tobacco Use  Smoking Status Former   Current packs/day: 0.00   Average packs/day: 2.0 packs/day for 50.0 years (100.0 ttl pk-yrs)   Types: Cigarettes   Start date: 04/11/1969   Quit date: 04/12/2019   Years since quitting: 4.8  Smokeless Tobacco Never   Social History    Substance and Sexual Activity  Alcohol  Use Not Currently   Social History   Substance and Sexual Activity  Drug Use Never    Allergies: Allergies  Allergen Reactions   Erythromycin Base Other (See Comments)   Ondansetron  Itching   Promethazine  Itching    Medications: Current Outpatient Medications  Medication Sig Dispense Refill   acetaminophen  (TYLENOL ) 500 MG tablet Take 1-2 tablets (500-1,000 mg total) by mouth every 6 (six) hours as needed. (Patient taking differently: Take 500-1,000 mg by mouth every 6 (six) hours as needed for mild pain or headache.) 30 tablet 0   cholecalciferol (VITAMIN D3) 25 MCG (1000 UNIT) tablet Take 1,000 Units by mouth daily.     dorzolamide -timolol  (COSOPT ) 22.3-6.8 MG/ML ophthalmic solution Place 1 drop into both eyes 2 (two) times daily.     ferrous sulfate  325 (65 FE) MG tablet Take 1 tablet (325 mg total) by mouth 2 (two) times daily. 60 tablet 3   furosemide  (LASIX ) 40 MG tablet Take 40 mg by mouth daily.     latanoprost  (XALATAN ) 0.005 % ophthalmic solution Place 1 drop into the right eye at bedtime.     Multiple Vitamins-Minerals (EYE HEALTH + LUTEIN   PO) Take 1 tablet by mouth daily.     Multiple Vitamins-Minerals (ZINC PO) Take 1 tablet by mouth daily.     nitroGLYCERIN (NITROSTAT) 0.4 MG SL tablet Place 0.4 mg under the tongue every 5 (five) minutes as needed for chest pain.     pantoprazole  (PROTONIX ) 40 MG tablet Take 1 tablet (40 mg) twice a day x8 weeks, followed by 1 tablet (40 mg) once a day 60 tablet 11   PHENobarbital  (LUMINAL) 32.4 MG tablet Take 64.8 mg by mouth at bedtime.     phenytoin  (DILANTIN ) 100 MG ER capsule Take 200 mg by mouth at bedtime.     tadalafil (CIALIS) 5 MG tablet Take 5 mg by mouth daily.     No current facility-administered medications for this visit.    Review of Systems: GENERAL: negative for malaise, night sweats HEENT: No changes in hearing or vision, no nose bleeds or other nasal problems. NECK:  Negative for lumps, goiter, pain and significant neck swelling RESPIRATORY: Negative for cough, wheezing CARDIOVASCULAR: Negative for chest pain, leg swelling, palpitations, orthopnea GI: SEE HPI MUSCULOSKELETAL: Negative for joint pain or swelling, back pain, and muscle pain. SKIN: Negative for lesions, rash HEMATOLOGY Negative for prolonged bleeding, bruising easily, and swollen nodes. ENDOCRINE: Negative for cold or heat intolerance, polyuria, polydipsia and goiter. NEURO: negative for tremor, gait imbalance, syncope and seizures. The remainder of the review of systems is noncontributory.   Physical Exam: There were no vitals taken for this visit. GENERAL: The patient is AO x3, in no acute distress. HEENT: Head is normocephalic and atraumatic. EOMI are intact. Mouth is well hydrated and without lesions. NECK: Supple. No masses LUNGS: Clear to auscultation. No presence of rhonchi/wheezing/rales. Adequate chest expansion HEART: RRR, normal s1 and s2. ABDOMEN: Soft, nontender, no guarding, no peritoneal signs, and nondistended. BS +. No masses. Negative fluid thrill or shifting dullness  Imaging/Labs: as above     Latest Ref Rng & Units 12/07/2021   12:51 PM 08/25/2021    1:18 AM 08/24/2021    8:22 AM  CBC  WBC 4.0 - 10.5 K/uL 6.8  8.2  7.9   Hemoglobin 13.0 - 17.0 g/dL 88.2  8.1  8.2   Hematocrit 39.0 - 52.0 % 36.5  25.4  25.6   Platelets 150 - 400 K/uL 236  213  222    Lab Results  Component Value Date   IRON 82 12/07/2021   TIBC 290 12/07/2021   FERRITIN 17 (L) 12/07/2021  CT scan results scanned in the chart performed 01/16/2024 at [facility] [place]   Demonstrating no abdominal ascites, spleen top normal size no focal lesions, normal CBD status post cholecystectomy Hepatomegaly with diffuse fatty change I personally reviewed and interpreted the available labs, imaging and endoscopic files.   Impression and Plan:  Alan Melendez is a 83 y.o. 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 pylori gastritis  # History of colon cancer/colon polyps # History of peptic ulcer disease # H. pylori gastritis treatment nave # Hepatic steatosis  Patient has a low fib 4 score although it is not validated for patients above age 45.  On exam patient does not have signs of advanced  chronic liver disease, no splenomegaly, ascites, spider angiomas, palmar eythema .  Platelets are also above 150 and hence patient unlikely has clinically significant portal hypertension  Can obtain FibroSure testing and INR with next blood work  I discussed with patient biopsies in 2023 upper endoscopy demonstrated H. pylori but he has not been treated for.  Patient has history of peptic ulcer disease and bloating/dyspepsia which can be explained by untreated H. Pylori.  Patient is agreeable for treatment for H. Pylori; bismuth  based quadruple therapy prescribed (Pylera); I advised patient that Bismuth  can turn stool black in color  I discussed the recommendation for surveillance colonoscopy given history of colon cancer and last colonoscopy in 2022 with multiple polyps and suggested repeat for 6 months.  I also discussed with patient possible surveillance upper endoscopy to ensure healing of the gastric ulcers  Patient wants to forego any further endoscopic evaluation.  I extensively explained patient and he does verbalizes understanding that not proceeding with upper endoscopy colonoscopy may mean missing an underlying lesion such as colon polyps/colon malignancy/gastric ulcer/gastric malignancy  Majority of today's clinic  encounter was spent reassuring patient that the recent CT scan ( scanned under media)  did not demonstrate any ascites and exam today was without any shifting dullness or fluid thrill as patient is concerned about  trapped gas and fluid all over his body  All questions were answered.      Jeannemarie Sawaya Faizan Danijela Vessey, MD Gastroenterology and Hepatology [facility]   This chart has been completed using [facility] Dictation software, and while attempts have been made to ensure accuracy , certain words and phrases may not be transcribed as intended

## 2024-02-05 DIAGNOSIS — C44319 Basal cell carcinoma of skin of other parts of face: Secondary | ICD-10-CM | POA: Diagnosis not present

## 2024-02-05 DIAGNOSIS — L720 Epidermal cyst: Secondary | ICD-10-CM | POA: Diagnosis not present

## 2024-02-05 DIAGNOSIS — Z85828 Personal history of other malignant neoplasm of skin: Secondary | ICD-10-CM | POA: Diagnosis not present

## 2024-02-05 DIAGNOSIS — Z08 Encounter for follow-up examination after completed treatment for malignant neoplasm: Secondary | ICD-10-CM | POA: Diagnosis not present

## 2024-04-22 DIAGNOSIS — L723 Sebaceous cyst: Secondary | ICD-10-CM | POA: Diagnosis not present

## 2024-04-22 DIAGNOSIS — Z6836 Body mass index (BMI) 36.0-36.9, adult: Secondary | ICD-10-CM | POA: Diagnosis not present

## 2024-05-01 ENCOUNTER — Other Ambulatory Visit (INDEPENDENT_AMBULATORY_CARE_PROVIDER_SITE_OTHER): Payer: Self-pay | Admitting: Gastroenterology

## 2024-05-01 ENCOUNTER — Encounter (INDEPENDENT_AMBULATORY_CARE_PROVIDER_SITE_OTHER): Payer: Self-pay

## 2024-05-01 DIAGNOSIS — A048 Other specified bacterial intestinal infections: Secondary | ICD-10-CM

## 2024-05-01 DIAGNOSIS — B9681 Helicobacter pylori [H. pylori] as the cause of diseases classified elsewhere: Secondary | ICD-10-CM

## 2024-05-01 NOTE — Telephone Encounter (Signed)
 Noted on med list to take once per day.

## 2024-05-01 NOTE — Telephone Encounter (Signed)
 I spoke with the patient and made him aware per Dr. Cinderella,  He can continue once day protonix  as he has history of large peptic ulcer disease       Patient states understanding.

## 2024-05-01 NOTE — Telephone Encounter (Signed)
 He can continue once day protonix  as he has history of large peptic ulcer disease

## 2024-07-06 ENCOUNTER — Ambulatory Visit (INDEPENDENT_AMBULATORY_CARE_PROVIDER_SITE_OTHER): Admitting: Gastroenterology
